# Patient Record
Sex: Female | Born: 1950
Health system: Southern US, Community
[De-identification: ages and names within clinical notes are randomized; demographics above are authoritative.]

## PROBLEM LIST (undated history)

## (undated) DIAGNOSIS — T7840XA Allergy, unspecified, initial encounter: Secondary | ICD-10-CM

## (undated) DIAGNOSIS — I10 Essential (primary) hypertension: Secondary | ICD-10-CM

## (undated) DIAGNOSIS — K219 Gastro-esophageal reflux disease without esophagitis: Secondary | ICD-10-CM

## (undated) DIAGNOSIS — G4730 Sleep apnea, unspecified: Secondary | ICD-10-CM

## (undated) DIAGNOSIS — M1990 Unspecified osteoarthritis, unspecified site: Secondary | ICD-10-CM

## (undated) DIAGNOSIS — K5900 Constipation, unspecified: Secondary | ICD-10-CM

## (undated) DIAGNOSIS — E785 Hyperlipidemia, unspecified: Secondary | ICD-10-CM

## (undated) HISTORY — PX: PROLAPSED UTERINE FIBROID LIGATION: SHX5400

## (undated) HISTORY — DX: Constipation, unspecified: K59.00

## (undated) HISTORY — DX: Allergy, unspecified, initial encounter: T78.40XA

## (undated) HISTORY — PX: ABDOMINAL HYSTERECTOMY: SHX81

## (undated) HISTORY — DX: Essential (primary) hypertension: I10

## (undated) HISTORY — PX: COLPORRHAPHY: SHX921

## (undated) HISTORY — DX: Hyperlipidemia, unspecified: E78.5

## (undated) HISTORY — PX: SALPINGOOPHORECTOMY: SHX82

## (undated) HISTORY — DX: Gastro-esophageal reflux disease without esophagitis: K21.9

## (undated) HISTORY — DX: Sleep apnea, unspecified: G47.30

## (undated) HISTORY — DX: Unspecified osteoarthritis, unspecified site: M19.90

---

## 2003-03-30 ENCOUNTER — Emergency Department (HOSPITAL_COMMUNITY): Admission: AD | Admit: 2003-03-30 | Discharge: 2003-03-30 | Payer: Self-pay | Admitting: Family Medicine

## 2003-04-13 ENCOUNTER — Emergency Department (HOSPITAL_COMMUNITY): Admission: AD | Admit: 2003-04-13 | Discharge: 2003-04-13 | Payer: Self-pay | Admitting: Family Medicine

## 2003-05-28 ENCOUNTER — Emergency Department (HOSPITAL_COMMUNITY): Admission: EM | Admit: 2003-05-28 | Discharge: 2003-05-28 | Payer: Self-pay | Admitting: Emergency Medicine

## 2004-01-10 ENCOUNTER — Ambulatory Visit: Payer: Self-pay | Admitting: Family Medicine

## 2004-01-15 ENCOUNTER — Ambulatory Visit: Payer: Self-pay | Admitting: Family Medicine

## 2004-01-16 ENCOUNTER — Ambulatory Visit: Payer: Self-pay | Admitting: *Deleted

## 2004-02-25 ENCOUNTER — Ambulatory Visit: Payer: Self-pay | Admitting: Family Medicine

## 2004-03-07 ENCOUNTER — Ambulatory Visit: Payer: Self-pay | Admitting: Internal Medicine

## 2004-03-11 ENCOUNTER — Ambulatory Visit: Payer: Self-pay | Admitting: Family Medicine

## 2004-10-06 ENCOUNTER — Ambulatory Visit: Payer: Self-pay | Admitting: Family Medicine

## 2004-10-13 ENCOUNTER — Ambulatory Visit: Payer: Self-pay | Admitting: Family Medicine

## 2004-11-10 ENCOUNTER — Ambulatory Visit: Payer: Self-pay | Admitting: Family Medicine

## 2005-02-08 ENCOUNTER — Emergency Department (HOSPITAL_COMMUNITY): Admission: EM | Admit: 2005-02-08 | Discharge: 2005-02-08 | Payer: Self-pay | Admitting: Emergency Medicine

## 2005-03-11 ENCOUNTER — Encounter (INDEPENDENT_AMBULATORY_CARE_PROVIDER_SITE_OTHER): Payer: Self-pay | Admitting: Family Medicine

## 2005-03-11 LAB — CONVERTED CEMR LAB: Pap Smear: NORMAL

## 2005-03-19 ENCOUNTER — Ambulatory Visit: Payer: Self-pay | Admitting: Family Medicine

## 2005-08-20 ENCOUNTER — Ambulatory Visit: Payer: Self-pay | Admitting: Family Medicine

## 2005-08-23 ENCOUNTER — Emergency Department (HOSPITAL_COMMUNITY): Admission: EM | Admit: 2005-08-23 | Discharge: 2005-08-23 | Payer: Self-pay | Admitting: Emergency Medicine

## 2005-08-25 ENCOUNTER — Ambulatory Visit: Payer: Self-pay | Admitting: Family Medicine

## 2005-08-31 ENCOUNTER — Ambulatory Visit: Payer: Self-pay | Admitting: Family Medicine

## 2005-09-15 ENCOUNTER — Ambulatory Visit: Payer: Self-pay | Admitting: Family Medicine

## 2005-09-18 DIAGNOSIS — R809 Proteinuria, unspecified: Secondary | ICD-10-CM | POA: Insufficient documentation

## 2005-10-26 ENCOUNTER — Ambulatory Visit: Payer: Self-pay | Admitting: Family Medicine

## 2005-12-07 ENCOUNTER — Ambulatory Visit: Payer: Self-pay | Admitting: Family Medicine

## 2005-12-08 DIAGNOSIS — M76899 Other specified enthesopathies of unspecified lower limb, excluding foot: Secondary | ICD-10-CM | POA: Insufficient documentation

## 2006-02-16 ENCOUNTER — Ambulatory Visit: Payer: Self-pay | Admitting: Family Medicine

## 2006-04-16 ENCOUNTER — Ambulatory Visit: Payer: Self-pay | Admitting: Family Medicine

## 2006-05-19 ENCOUNTER — Ambulatory Visit (HOSPITAL_COMMUNITY): Admission: RE | Admit: 2006-05-19 | Discharge: 2006-05-19 | Payer: Self-pay | Admitting: Internal Medicine

## 2006-09-20 ENCOUNTER — Telehealth (INDEPENDENT_AMBULATORY_CARE_PROVIDER_SITE_OTHER): Payer: Self-pay | Admitting: *Deleted

## 2006-09-22 ENCOUNTER — Encounter (INDEPENDENT_AMBULATORY_CARE_PROVIDER_SITE_OTHER): Payer: Self-pay | Admitting: *Deleted

## 2006-09-24 ENCOUNTER — Encounter (INDEPENDENT_AMBULATORY_CARE_PROVIDER_SITE_OTHER): Payer: Self-pay | Admitting: Family Medicine

## 2006-09-24 DIAGNOSIS — E785 Hyperlipidemia, unspecified: Secondary | ICD-10-CM | POA: Insufficient documentation

## 2006-09-24 DIAGNOSIS — D259 Leiomyoma of uterus, unspecified: Secondary | ICD-10-CM | POA: Insufficient documentation

## 2006-09-28 ENCOUNTER — Ambulatory Visit: Payer: Self-pay | Admitting: Family Medicine

## 2006-09-28 ENCOUNTER — Encounter (INDEPENDENT_AMBULATORY_CARE_PROVIDER_SITE_OTHER): Payer: Self-pay | Admitting: Family Medicine

## 2006-09-28 DIAGNOSIS — M25569 Pain in unspecified knee: Secondary | ICD-10-CM | POA: Insufficient documentation

## 2006-09-28 DIAGNOSIS — L723 Sebaceous cyst: Secondary | ICD-10-CM | POA: Insufficient documentation

## 2006-09-28 DIAGNOSIS — N814 Uterovaginal prolapse, unspecified: Secondary | ICD-10-CM | POA: Insufficient documentation

## 2006-09-28 DIAGNOSIS — I10 Essential (primary) hypertension: Secondary | ICD-10-CM | POA: Insufficient documentation

## 2006-09-28 LAB — CONVERTED CEMR LAB
Bilirubin Urine: NEGATIVE
Blood in Urine, dipstick: NEGATIVE
Glucose, Urine, Semiquant: NEGATIVE
Pap Smear: NORMAL
Specific Gravity, Urine: >=1.03
pH: 6

## 2006-10-04 ENCOUNTER — Encounter (INDEPENDENT_AMBULATORY_CARE_PROVIDER_SITE_OTHER): Payer: Self-pay | Admitting: Family Medicine

## 2006-10-05 ENCOUNTER — Encounter (INDEPENDENT_AMBULATORY_CARE_PROVIDER_SITE_OTHER): Payer: Self-pay | Admitting: Family Medicine

## 2006-10-15 ENCOUNTER — Ambulatory Visit: Payer: Self-pay | Admitting: Family Medicine

## 2006-10-15 LAB — CONVERTED CEMR LAB
ALT: 33 units/L (ref 0–35)
AST: 27 units/L (ref 0–37)
Alkaline Phosphatase: 77 units/L (ref 39–117)
Calcium: 9.2 mg/dL (ref 8.4–10.5)
Chloride: 103 meq/L (ref 96–112)
Creatinine, Ser: 0.76 mg/dL (ref 0.40–1.20)
HDL: 50 mg/dL (ref >39)
LDL Cholesterol: 109 mg/dL — ABNORMAL HIGH (ref 0–99)
Total CHOL/HDL Ratio: 3.6
VLDL: 23 mg/dL (ref 0–40)

## 2007-05-27 ENCOUNTER — Ambulatory Visit (HOSPITAL_COMMUNITY): Admission: RE | Admit: 2007-05-27 | Discharge: 2007-05-27 | Payer: Self-pay | Admitting: Family Medicine

## 2007-07-21 ENCOUNTER — Telehealth (INDEPENDENT_AMBULATORY_CARE_PROVIDER_SITE_OTHER): Payer: Self-pay | Admitting: Family Medicine

## 2007-09-13 ENCOUNTER — Ambulatory Visit: Payer: Self-pay | Admitting: Family Medicine

## 2007-09-13 LAB — CONVERTED CEMR LAB
Cholesterol: 180 mg/dL (ref 0–200)
Triglycerides: 113 mg/dL (ref <150)

## 2007-09-26 ENCOUNTER — Encounter (INDEPENDENT_AMBULATORY_CARE_PROVIDER_SITE_OTHER): Payer: Self-pay | Admitting: Family Medicine

## 2007-11-15 ENCOUNTER — Ambulatory Visit: Payer: Self-pay | Admitting: Family Medicine

## 2007-11-15 ENCOUNTER — Encounter (INDEPENDENT_AMBULATORY_CARE_PROVIDER_SITE_OTHER): Payer: Self-pay | Admitting: Family Medicine

## 2007-11-15 LAB — CONVERTED CEMR LAB
ALT: 18 units/L (ref 0–35)
AST: 19 units/L (ref 0–37)
BUN: 14 mg/dL (ref 6–23)
Basophils Absolute: 0 10*3/uL (ref 0.0–0.1)
Basophils Relative: 0 % (ref 0–1)
Calcium: 9.4 mg/dL (ref 8.4–10.5)
Creatinine, Ser: 0.76 mg/dL (ref 0.40–1.20)
Eosinophils Relative: 1 % (ref 0–5)
HCT: 41.4 % (ref 36.0–46.0)
Hemoglobin: 14.2 g/dL (ref 12.0–15.0)
MCHC: 34.3 g/dL (ref 30.0–36.0)
MCV: 90.6 fL (ref 78.0–100.0)
Monocytes Absolute: 0.7 10*3/uL (ref 0.1–1.0)
Monocytes Relative: 8 % (ref 3–12)
RBC: 4.57 M/uL (ref 3.87–5.11)
RDW: 14.3 % (ref 11.5–15.5)
Total Bilirubin: 0.5 mg/dL (ref 0.3–1.2)

## 2007-11-22 ENCOUNTER — Encounter (INDEPENDENT_AMBULATORY_CARE_PROVIDER_SITE_OTHER): Payer: Self-pay | Admitting: Family Medicine

## 2008-01-25 ENCOUNTER — Ambulatory Visit: Payer: Self-pay | Admitting: Obstetrics and Gynecology

## 2008-01-26 ENCOUNTER — Ambulatory Visit (HOSPITAL_COMMUNITY): Admission: RE | Admit: 2008-01-26 | Discharge: 2008-01-26 | Payer: Self-pay | Admitting: Family Medicine

## 2008-02-01 ENCOUNTER — Ambulatory Visit: Payer: Self-pay | Admitting: Obstetrics & Gynecology

## 2008-02-20 ENCOUNTER — Ambulatory Visit: Payer: Self-pay | Admitting: Obstetrics & Gynecology

## 2008-02-20 ENCOUNTER — Encounter: Payer: Self-pay | Admitting: Obstetrics & Gynecology

## 2008-02-20 ENCOUNTER — Inpatient Hospital Stay (HOSPITAL_COMMUNITY): Admission: RE | Admit: 2008-02-20 | Discharge: 2008-02-21 | Payer: Self-pay | Admitting: Obstetrics & Gynecology

## 2008-02-23 ENCOUNTER — Ambulatory Visit: Payer: Self-pay | Admitting: Obstetrics & Gynecology

## 2008-03-29 ENCOUNTER — Ambulatory Visit: Payer: Self-pay | Admitting: Obstetrics and Gynecology

## 2010-04-22 LAB — CROSSMATCH: ABO/RH(D): O POS

## 2010-04-22 LAB — CBC
HCT: 41.9 % (ref 36.0–46.0)
HCT: 34.6 % — ABNORMAL LOW (ref 36.0–46.0)
Hemoglobin: 11.4 g/dL — ABNORMAL LOW (ref 12.0–15.0)
MCHC: 32.4 g/dL (ref 30.0–36.0)
MCHC: 33.1 g/dL (ref 30.0–36.0)
MCV: 92.7 fL (ref 78.0–100.0)
MCV: 92.9 fL (ref 78.0–100.0)
RBC: 4.52 MIL/uL (ref 3.87–5.11)
RBC: 3.72 MIL/uL — ABNORMAL LOW (ref 3.87–5.11)
WBC: 9.9 10*3/uL (ref 4.0–10.5)
WBC: 12.8 10*3/uL — ABNORMAL HIGH (ref 4.0–10.5)

## 2010-04-22 LAB — BASIC METABOLIC PANEL
BUN: 15 mg/dL (ref 6–23)
CO2: 30 mEq/L (ref 19–32)
Chloride: 103 mEq/L (ref 96–112)
Creatinine, Ser: 0.56 mg/dL (ref 0.4–1.2)
GFR calc Af Amer: >60 mL/min (ref >60)
Potassium: 3.9 mEq/L (ref 3.5–5.1)

## 2010-05-20 NOTE — Group Therapy Note (Signed)
NAME:  Heather Gibson, Heather Gibson NO.:  [id number]   MEDICAL RECORD NO.:  [id number]          PATIENT TYPE:  WOC   LOCATION:  [facility]                   FACILITY:  [facility]   PHYSICIAN:  Scheryl Darter, MD       DATE OF BIRTH:  1950/05/29   DATE OF SERVICE:                                  CLINIC NOTE   The patient comes today in followup after a transvaginal hysterectomy,  anterior colporrhaphy, and right salpingo-oophorectomy that was  performed by Dr. Marice Potter on February 20, 2008.  The patient has no  complaints.  She is currently using Estrace vaginal cream, but would  like to be on estrogen replacement by mouth.  No complaints of abnormal  discharge or urinary symptoms.  No pain or fever.   PHYSICAL EXAMINATION:  GENERAL:  The patient is in no acute distress.  ABDOMEN:  Soft, nontender.  EXTERNAL GENITALIA:  Vagina and vaginal cuff are well supported with  remnant of suture visible and well-healed suture line.  Vaginal cuff is  nontender.  No mass.   IMPRESSION:  She is doing well postoperatively.   PLAN:  The patient will have routine followup.  I gave her prescription  for Estrace 0.5 mg p.o. daily.  She should had yearly pelvic exams,  breast exams, and mammograms.  Also, discussed calcium supplementation  and vitamin D.      Scheryl Darter, MD     JA/MEDQ  D:  03/29/2008  T:  03/30/2008  Job:  [id number]

## 2010-05-20 NOTE — Group Therapy Note (Signed)
NAMEMIRIAM, Heather Gibson NO.:  [id number]   MEDICAL RECORD NO.:  [id number]          PATIENT TYPE:  WOC   LOCATION:  [facility]                   FACILITY:  [facility]   PHYSICIAN:  Johnella Moloney, MD        DATE OF BIRTH:  01-25-1950   DATE OF SERVICE:  02/01/2008                                  CLINIC NOTE   The patient is a 60 year old gravida 4, para 3-0-1-3 who was last seen  on January 25, 2008, for third degree uterine prolapse and a  cystorectocele.  At that point, the patient was counseled regarding  surgery and a surgical order was sent in for scheduling.  However, as a  means to alleviate her symptoms prior to surgery, Gelhorn pessary was  placed.  The patient reports that as soon as she went home the pessary  did fall out.  She said that it was very uncomfortable while it was in  place, and she is not interested in having this replaced at this visit.  The patient was also given a prescription for estrogen cream which she  feels is very expensive and wants to know if it was really necessary to  have this.  She has no other complaints.   The patient was told that given the nature of her cystorectocele and the  uterine prolapse that she will need repair of the cystocele or rectocele  as well as hysterectomy.  In order to prepare the vagina for surgery, a  course of estradiol cream is usually recommended in order to help  strengthen the tissues of the vagina and make them optimal for surgery.  She does understand this and will go ahead and fill this prescription  for the estradiol cream.  Of note, the patient did have a pelvic  ultrasound on January 26 1008, which showed that her uterus was about 7  weeks' size but had a subserosal fibroid emanating off the fundus of the  uterus which measured 6 x 5.8 x 5.5 cm.  No other abnormalities were  seen.  This result was discussed with the patient.  She was told that a  surgical scheduler should be contacting her soon with  information  regarding the date and time of her surgery.           ______________________________  Johnella Moloney, MD     UD/MEDQ  D:  02/01/2008  T:  02/01/2008  Job:  [id number]

## 2010-05-20 NOTE — Group Therapy Note (Signed)
NAME:  Heather Gibson, BAUS NO.:  [id number]   MEDICAL RECORD NO.:  [id number]          PATIENT TYPE:  WOC   LOCATION[facility]                   [facility]ACILITY[facility]   PHYSICIAN:  Argentina Donovan, MD        DATE OF BIRTH:  December 13, 1950   DATE OF SERVICE:  01/25/2008                                  CLINIC NOTE   The patient is a 60 year old African-American female, gravida 4, para 3-  0-1-3, with her youngest being 59 years old, who has been followed by  [facility] and was referred to the GYN clinic because of a third degree  uterine prolapse.  The patient has had the condition for about the last  3 years.  She is married but not sexually active at the present time  because of this condition.  She has no sign of stress incontinence, but  she does feel like she does not empty her bladder and finds that she has  very significant frequency of urination.   ALLERGIES:  ALLERGIES TO SEAFOOD, BUT NO MEDICATIONS.   MEDICATIONS:  1. Vytorin 10/80.  2. Benazepril with hydrochlorothiazide 20/15/5 daily for hypertension      or hypercholesterolemia.   PAST HISTORY:  1. Tubal ligation.  2. Has been treated for hypertension.  3. Has been treated for hypercholesterolemia.  (Other than that, non remarkable.)   FAMILY HISTORY:  Grandmother had diabetes.  Her parents and grandparents  have hypertension.  Her mother has recently been diagnosed with breast  cancer but refuses treatment.   REVIEW OF SYSTEMS:  Fairly negative except for the present illness.   SOCIAL HISTORY:  The patient is currently married.  Never smoked or used  alcohol or illicit drugs.   PHYSICAL EXAMINATION:  VITAL SIGNS:  Blood pressure is 186/97 (i.e. this  is her first visit; will recheck that with her next visit in 1 week).  The patient's weight is 235 and she is 5 feet, 7 inches tall.  Pulses is  83 per minute and regular.  GENERAL:  A well-developed, obese, black female in no acute distress.  HEENT:  Head  normocephalic.  Pupils equal, round and reactive to light  and accommodation; within normal limits.  NECK:  Supple.  Thyroid symmetrical with no masses.  Neck is erect.  BREASTS:  Symmetrical with no dominant masses, no nipple discharge, no  supraclavicular or axillary nodes noted.  LUNGS:  Clear to auscultation and percussion.  HEART:  Normal sinus rhythm.  No murmur, no gallop, no click or rub.  ABDOMEN:  Soft, flat, nontender.  No masses, no organomegaly noted.  EXTREMITIES:  No edema.  No varicosities.  DTRs within normal limits.  GENITALIA:  External genitalia is normal.  BUS is within normal limits.  On Valsalva, the cervix exits the vagina.  It can be easily put back in.  The vagina is clean and well rugated.  Cervix is clean and parous and  well epithelialized.  Bimanual pelvic examination - the uterus feels  slightly enlarged but could not be well outlined because of the  patient's habitus, nor could the adnexa be evaluated.  IMPRESSION:  Third-degree uterine prolapse with cystorectocele.   PLAN:  The plan is to get an ultrasound to evaluate the uterus.  Pap  smear was recently taken and normal.  The patient has had a normal  mammogram just recently.  Temporarily, I have placed a 7 cm Gellhorn  pessary to hopefully hold up the cervix so that we can prepare the  vagina for surgery.  I have given the patient estradiol cream to use  nightly until seen in a week.  We are going to reevaluate the pessary at  that time.  I am going to put the papers in for surgery.  I am not sure  who will be doing the surgery.  I will have to ask around and find out  which of the doctors feel more comfortable with this procedure.           ______________________________  Argentina Donovan, MD     PR/MEDQ  D:  01/25/2008  T:  01/25/2008  Job:  [id number]

## 2010-05-20 NOTE — Discharge Summary (Signed)
Heather Gibson, Heather Gibson             ACCOUNT NO.:  [id number]   MEDICAL RECORD NO.:  [id number]          PATIENT TYPE:  INP   LOCATION:  9309                          [facility]   PHYSICIAN:  Allie Bossier, MD        DATE OF BIRTH:  03/16/1950   DATE OF ADMISSION:  02/20/2008  DATE OF DISCHARGE:  02/21/2008                               DISCHARGE SUMMARY   ADMISSION DIAGNOSIS:  Complete procidentia.   DISCHARGE DIAGNOSIS:  Complete procidentia.   PROCEDURE:  During stay, total vaginal hysterectomy, anterior  colporrhaphy, right salpingo-oophorectomy.   CONDITION:  Stable.   DISPOSITION:  Home.   DIET:  As tolerated.   FOLLOWUP:  Followup in 2 days for a voiding trial at the GYN Clinic.   MEDICATIONS:  Percocet 5/325 mg one p.o. every 4 hours pain, #30, no  refills, estrogen cream 1.5 g per vagina every other day.   HISTORY OF PRESENT ILLNESS AND HOSPITAL COURSE:  Heather Gibson is a 61-  year-old married black gravida 4, para 4, three living children who was  seen at the GYN Clinic as a referral from the [facility] for  what was written as third-degree prolapse which started about 18 months  ago, it has become much worse.  Of note, she did not use her estrogen  cream per vagina as directed and I have explained that could effect the  healing process of the surgery.  It was strongly recommended that she  use that on discharge.  She was admitted on the 15th and underwent an  uncomplicated vaginal hysterectomy, anterior repair, and a right  salpingo-oophorectomy (I was not able to remove the very tiny atrophic  left ovary __________).  By postoperative day #1, her hemoglobin was  11.4, preoperatively her hemoglobin was 13.6.  On postop day #1, she was  eating and denying nausea or vomiting.  She was ambulating and ready to  go home.  We did do a voiding trial; however, she had postvoid residual,  was greater than 100 mL and I told her she will need to have her  catheter  left in and come back.  She was discharged home in stable  condition with a normal physical exam.      Allie Bossier, MD  Electronically Signed     MCD/MEDQ  D:  03/18/2008  T:  03/19/2008  Job:  [id number]

## 2010-05-20 NOTE — Op Note (Signed)
Heather Gibson, Heather Gibson             ACCOUNT NO.:  [id number]   MEDICAL RECORD NO.:  [id number]          PATIENT TYPE:  INP   LOCATION:  9309                          [facility]   PHYSICIAN:  Allie Bossier, MD        DATE OF BIRTH:  1950/04/20   DATE OF PROCEDURE:  02/20/2008  DATE OF DISCHARGE:                               OPERATIVE REPORT   PREOPERATIVE DIAGNOSES:  1. Complete procidentia.  2. Cystocele.  3. Fibroid uterus.  4. Obesity.   POSTOPERATIVE DIAGNOSES:  1. Complete procidentia.  2. Cystocele.  3. Fibroid uterus.  4. Obesity.   PROCEDURES:  1. Total vaginal hysterectomy.  2. Right salpingo-oophorectomy.  3. Anterior colporrhaphy.   SURGEON:  Allie Bossier, MD   ASSISTANTMichele Mcalpine D. Okey Dupre, MD   ANESTHESIA:  Belva Agee, MD, GETA.   COMPLICATIONS:  None.   ESTIMATED BLOOD LOSS:  Less than 200 mL.   SPECIMEN:  Uterus with cervix and right tube and ovary.   DETAILS OF PROCEDURE AND FINDINGS:  Preoperatively, all questions were  answered.  I explained to Ms. Locastro that I would remove her ovaries  and tubes if I was able to, but at times in a postmenopausal woman, they  are difficult to remove and that I would leave them if I felt that  removing her ovaries would put her in danger of a complication.  In the  operating room, general anesthesia was applied without complication.  She was placed in dorsal lithotomy position.  Her lower abdomen and  vagina were prepped and draped in the usual sterile fashion using  chlorhexidine.  A Foley catheter was placed in it and drained clear  urine throughout the case.  A bimanual exam revealed a normal size and  shape of uterus.  A very long cervix was protruding from the vaginal  opening by at least 2 or 3 cm.  A weighted speculum was placed  posteriorly and Deaver anteriorly.  Solution was created using 0.5%  Marcaine (30 mL and 100 mL of injectable saline).  This solution was  used throughout this case unless  otherwise specified.  Initially, I  injected approximately 70 mL in a circumferential fashion at the  cervicovaginal junction and then made a circumferential incision, there  was a #10 blade.  Posterior peritoneum was entered, tagged, and held.  A  2-0 Vicryl sutures used throughout this case unless otherwise specified.  The anterior peritoneum was entered.  The uterosacral ligaments were  clamped, cut, and doubly ligated.  These were tagged and held for  further use.  The very long cervix was separated from its pelvic  attachments using the clamp, cut, and ligate technique.  The utero-  ovarian ligaments were clamped, cut, and doubly ligated.  These were  then tagged and held for further use.  The right ovary appeared to be  slightly larger than the normal postmenopausal ovary.  It was grasped  with a Babcock clamp and the infundibular pelvic ligament was clamped,  cut, and doubly ligated.  Excellent hemostasis was noted.  On the  patient's left side, the ovary was much smaller, and it was fairly  adherent to the pelvic sidewall.  While I was able to grasp it with a  Babcock, there was no descensus of the pedicle or of the adnexa and I  was not able to grasp the pedicle with a clamp.  Therefore I left the  left adnexa in situ and then I used the tagged uterosacral ligaments to  make angle sutures.  I used a mattress suture at each side.  Then,  proceeded with the anterior repair and used the injectable solution  again, approximately 30 mL were injected in a vertical fashion in the  vaginal mucosa.  Using Metzenbaum scissors, I made a vertical cut here,  I then used T-clamps to help dissect the overlying vaginal mucosa off  the underlying vesicovaginal fascia.  I then plicated using interrupted  2-0 Vicryl sutures.  The excess vaginal mucosa was excised and the  vaginal mucosa was reapproximated using a 2-0 Vicryl running locking  suture.  Excellent hemostasis was noted.  After changing  gloves, I did  rectal exam, there did not appear to be a significant rectocele and good  cosmetic results were obtained.  She was extubated and taken to recovery  room in stable condition.  Her Foley catheter drained clear urine  throughout.  She tolerated the procedure well.       Allie Bossier, MD  Electronically Signed     MCD/MEDQ  D:  02/20/2008  T:  02/21/2008  Job:  [id number]

## 2012-02-10 ENCOUNTER — Other Ambulatory Visit (HOSPITAL_COMMUNITY): Payer: Self-pay | Admitting: Family Medicine

## 2012-02-10 DIAGNOSIS — Z1231 Encounter for screening mammogram for malignant neoplasm of breast: Secondary | ICD-10-CM

## 2012-02-18 ENCOUNTER — Ambulatory Visit (HOSPITAL_COMMUNITY): Payer: Self-pay

## 2012-02-24 ENCOUNTER — Ambulatory Visit (HOSPITAL_COMMUNITY)
Admission: RE | Admit: 2012-02-24 | Discharge: 2012-02-24 | Disposition: A | Discharge status: Home / Self Care | Payer: Self-pay | Source: Ambulatory Visit | Attending: Family Medicine | Admitting: Family Medicine

## 2012-02-24 DIAGNOSIS — Z1231 Encounter for screening mammogram for malignant neoplasm of breast: Secondary | ICD-10-CM

## 2012-10-10 ENCOUNTER — Ambulatory Visit (INDEPENDENT_AMBULATORY_CARE_PROVIDER_SITE_OTHER): Payer: Self-pay | Admitting: Obstetrics & Gynecology

## 2012-10-10 ENCOUNTER — Encounter: Payer: Self-pay | Admitting: Obstetrics & Gynecology

## 2012-10-10 VITALS — BP 171/84 | HR 58 | Ht 66.5 in | Wt 194.9 lb

## 2012-10-10 DIAGNOSIS — N993 Prolapse of vaginal vault after hysterectomy: Secondary | ICD-10-CM

## 2012-10-10 NOTE — Progress Notes (Signed)
  Subjective:    Patient ID: Heather Gibson, female    DOB: 03/25/1950, 62 y.o.   MRN: [id number]  HPI  This 62 yo abstinent lady is here because of vaginal vault prolapse. She had a TVH/RSO/anterior repair in 2010 for complete procedentia. She denies urinary incontinence.   Review of Systems     Objective:   Physical Exam  Mild vaginal atrophy Complete prolapse of vaginal vault with Valsalva  I treated her with a #7 (3 1/2 inch) ring pessary. It relieved her symptoms and she was able to remove and replaceit easily. It caused no pain.     Assessment & Plan:  Vaginal vault prolapse- Pessary RTC 4 weeks

## 2012-10-14 ENCOUNTER — Telehealth: Payer: Self-pay

## 2012-10-14 NOTE — Telephone Encounter (Signed)
Pt called and stated that she wanted to change her appt.

## 2012-10-27 NOTE — Telephone Encounter (Signed)
Will transfer to front desk to change appointment and for them to call pt. With new appt.

## 2012-11-16 ENCOUNTER — Encounter: Payer: Self-pay | Admitting: Obstetrics & Gynecology

## 2012-11-16 ENCOUNTER — Ambulatory Visit (INDEPENDENT_AMBULATORY_CARE_PROVIDER_SITE_OTHER): Payer: Self-pay | Admitting: Obstetrics & Gynecology

## 2012-11-16 VITALS — BP 147/80 | HR 62 | Temp 98.6°F | Ht 66.5 in | Wt 194.1 lb

## 2012-11-16 DIAGNOSIS — N993 Prolapse of vaginal vault after hysterectomy: Secondary | ICD-10-CM

## 2012-11-16 NOTE — Progress Notes (Signed)
Pt. States pessary did not work; fell out 3-4 days after having it placed.

## 2012-11-16 NOTE — Progress Notes (Signed)
  Subjective:    Patient ID: Heather Gibson, female    DOB: 1950/11/07, 62 y.o.   MRN: [id number]  HPI  Ms. Kisling is here for a pessary check. She was using a #7 ring but it eventually fell out.   Review of Systems     Objective:   Physical Exam  I placed a #5 Regula. She said that it relieved her prolapse and she could not feel it. She was able to remove and replace it.       Assessment & Plan:   Vaginal vault prolapse- RTC 1 month for pessary check

## 2012-12-14 ENCOUNTER — Ambulatory Visit: Payer: Self-pay | Admitting: Obstetrics & Gynecology

## 2013-02-06 ENCOUNTER — Encounter: Payer: Self-pay | Admitting: Obstetrics & Gynecology

## 2013-02-06 ENCOUNTER — Ambulatory Visit (INDEPENDENT_AMBULATORY_CARE_PROVIDER_SITE_OTHER): Payer: BC Managed Care – PPO | Admitting: Obstetrics & Gynecology

## 2013-02-06 VITALS — BP 166/84 | HR 73 | Temp 98.0°F | Ht 65.0 in | Wt 196.3 lb

## 2013-02-06 DIAGNOSIS — N993 Prolapse of vaginal vault after hysterectomy: Secondary | ICD-10-CM

## 2013-02-06 NOTE — Progress Notes (Signed)
   Subjective:    Patient ID: Heather Gibson, female    DOB: 1950/05/03, 63 y.o.   MRN: [id number]  HPI This lovely lady s/p hysterectomy is here today for a pessary check. She was fitted for a #5 Regula pessary which seemed to be the right fit in the office 10/14. However, she tells me that it fell out after a few days. I tried a #6 here today and it fell out as well. I tried a #7 cube, but it was too large and wouldn't fit in.   Review of Systems She has already had her flu vaccine this season.    Objective:   Physical Exam        Assessment & Plan:  Prolapse of vagina after hysterectomy. I will order a #5 and #6 cube. I will also refer her to Dr. Maryland Pink.

## 2013-02-06 NOTE — Progress Notes (Signed)
Referral made to [place] with Dr. Maryland Pink. Appointment 03/13/13 at 2:45pm at [facility] office. Pt informed that they will be sending her forms to fill out and return. Pt. Verbalized understanding.

## 2013-03-06 ENCOUNTER — Ambulatory Visit (INDEPENDENT_AMBULATORY_CARE_PROVIDER_SITE_OTHER): Payer: BC Managed Care – PPO | Admitting: Obstetrics & Gynecology

## 2013-03-06 ENCOUNTER — Encounter: Payer: Self-pay | Admitting: Obstetrics & Gynecology

## 2013-03-06 VITALS — BP 180/80 | HR 64 | Temp 97.6°F | Ht 66.0 in | Wt 200.1 lb

## 2013-03-06 DIAGNOSIS — N993 Prolapse of vaginal vault after hysterectomy: Secondary | ICD-10-CM

## 2013-03-06 NOTE — Progress Notes (Signed)
   Subjective:    Patient ID: Heather Gibson, female    DOB: [birth year], 63 y.o.   MRN: [id number]  HPI  She is here for her pessary fitting. She has an appt with Dr. Maryland Pink next week.   Review of Systems     Objective:   Physical Exam  #5 cube worked well. She was able to remove and replace it.      Assessment & Plan:  Prolapse of vagina- pessary for now. She will RTC here in a month if she decides not to have surgery with Dr. Zigmund Daniel. She will remove it QOD

## 2013-03-06 NOTE — Progress Notes (Signed)
Pt stated that she has taken BP medication today, reason for elevated BP

## 2013-04-12 ENCOUNTER — Ambulatory Visit: Payer: BC Managed Care – PPO | Admitting: Obstetrics & Gynecology

## 2013-11-06 ENCOUNTER — Encounter: Payer: Self-pay | Admitting: Obstetrics & Gynecology

## 2015-01-06 HISTORY — PX: COLONOSCOPY: SHX174

## 2015-01-21 ENCOUNTER — Ambulatory Visit
Admission: RE | Admit: 2015-01-21 | Discharge: 2015-01-21 | Disposition: A | Discharge status: Home / Self Care | Payer: BLUE CROSS/BLUE SHIELD | Source: Ambulatory Visit

## 2015-01-21 ENCOUNTER — Other Ambulatory Visit: Payer: Self-pay

## 2015-01-21 DIAGNOSIS — Z1231 Encounter for screening mammogram for malignant neoplasm of breast: Secondary | ICD-10-CM

## 2015-04-05 ENCOUNTER — Ambulatory Visit (AMBULATORY_SURGERY_CENTER): Payer: Self-pay | Admitting: *Deleted

## 2015-04-05 VITALS — Ht 67.0 in | Wt 224.0 lb

## 2015-04-05 DIAGNOSIS — Z1211 Encounter for screening for malignant neoplasm of colon: Secondary | ICD-10-CM

## 2015-04-05 NOTE — Progress Notes (Signed)
Pt has [facility] and her procedure is on 05-03-15.  Staff message sent to Julieanne Cotton about a sample.  I put on her instructions to call her if she has not heard from [place] by 04-19-15

## 2015-04-15 ENCOUNTER — Ambulatory Visit (INDEPENDENT_AMBULATORY_CARE_PROVIDER_SITE_OTHER): Payer: BLUE CROSS/BLUE SHIELD | Admitting: Adult Health

## 2015-04-15 ENCOUNTER — Encounter: Payer: Self-pay | Admitting: Adult Health

## 2015-04-15 VITALS — BP 122/68 | Temp 98.8°F | Wt 224.2 lb

## 2015-04-15 DIAGNOSIS — Z7189 Other specified counseling: Secondary | ICD-10-CM | POA: Diagnosis not present

## 2015-04-15 DIAGNOSIS — I10 Essential (primary) hypertension: Secondary | ICD-10-CM | POA: Diagnosis not present

## 2015-04-15 DIAGNOSIS — Z780 Asymptomatic menopausal state: Secondary | ICD-10-CM | POA: Diagnosis not present

## 2015-04-15 DIAGNOSIS — E785 Hyperlipidemia, unspecified: Secondary | ICD-10-CM

## 2015-04-15 DIAGNOSIS — Z7689 Persons encountering health services in other specified circumstances: Secondary | ICD-10-CM

## 2015-04-15 NOTE — Patient Instructions (Signed)
It was great meeting you today!  Please call the breast center to schedule your bone density screen  Work on diet and exercise - follow up with me in 3 months, at that time your goal weight is 200 pounds.   Have your old PCP send me their notes on you.   If you need anything in the meantime, please let me know.

## 2015-04-15 NOTE — Progress Notes (Signed)
Patient presents to clinic today to establish care. She is a pleasant AA female. She  has a past medical history of Hypertension; Hyperlipidemia; GERD (gastroesophageal reflux disease); and Constipation.  Acute Concerns: Establish Care  Chronic Issues: Hypertension - Recently started on Norvasc, Has been on Zestoretic for many years.   Hyperlipidemia She recently had a physical at her old primary care provider. She was told her cholesterol was high and was started on Crestor.   Health Maintenance: Dental -- Has not seen  Vision -- Eye doctor  Immunizations -- UTD Colonoscopy -- Has an appointment on April 28 Mammogram -- January 2017 - clean PAP -- No longer needs Bone Density -- Bone Density Scan  Exercise: Does not exercise  Diet: Eats healthy.   Past Medical History  Diagnosis Date  . Hypertension   . Hyperlipidemia     Past Surgical History  Procedure Laterality Date  . Abdominal hysterectomy    . Salpingoophorectomy Right   . Colporrhaphy    . Prolapsed uterine fibroid ligation      x2    Current Outpatient Prescriptions on File Prior to Visit  Medication Sig Dispense Refill  . acetaminophen (TYLENOL) 325 MG tablet Take 650 mg by mouth every 6 (six) hours as needed.    Marland Kitchen amLODipine (NORVASC) 2.5 MG tablet Take 10 mg by mouth.    . Calcium Carbonate-Vit D-Min (CALCIUM 1200 PO) Take by mouth.    Marland Kitchen lisinopril-hydrochlorothiazide (PRINZIDE,ZESTORETIC) 20-25 MG per tablet Take 1 tablet by mouth daily.     No current facility-administered medications on file prior to visit.    Allergies  Allergen Reactions  . Eggs Or Egg-Derived Products     Years ago dx- eats egg now without any problems  . Latex Swelling  . Milk-Related Compounds     Cannot have large amt of milk products  . Peanut-Containing Drug Products     Cannot have large amts of    Family History  Problem Relation Age of Onset  . Colon cancer Neg Hx   . Esophageal cancer Neg Hx   . Rectal  cancer Neg Hx   . Stomach cancer Neg Hx     Social History   Social History  . Marital Status: Married    Spouse Name: N/A  . Number of Children: N/A  . Years of Education: N/A   Occupational History  . Not on file.   Social History Main Topics  . Smoking status: Never Smoker   . Smokeless tobacco: Never Used  . Alcohol Use: No  . Drug Use: No  . Sexual Activity: Not Currently    Birth Control/ Protection: Post-menopausal   Other Topics Concern  . Not on file   Social History Narrative    Review of Systems  Constitutional: Negative.   HENT: Negative.   Eyes: Negative.   Respiratory: Negative.   Cardiovascular: Negative.   Gastrointestinal: Negative.   Genitourinary: Negative.   Musculoskeletal: Negative.   Skin: Negative.   Neurological: Negative.   Endo/Heme/Allergies: Negative.   Psychiatric/Behavioral: Negative.   All other systems reviewed and are negative.   BP 122/68 mmHg  Temp(Src) 98.8 F (37.1 C) (Oral)  Wt 224 lb 3.2 oz (101.696 kg)  Physical Exam  Constitutional: She is well-developed, well-nourished, and in no distress. No distress.  HENT:  Head: Normocephalic and atraumatic.  Right Ear: External ear normal.  Left Ear: External ear normal.  Nose: Nose normal.  Mouth/Throat: No oropharyngeal exudate.  Cardiovascular: Normal  rate, regular rhythm, normal heart sounds and intact distal pulses.  Exam reveals no gallop and no friction rub.   No murmur heard. Pulmonary/Chest: Effort normal and breath sounds normal. No respiratory distress. She has no wheezes. She has no rales. She exhibits no tenderness.  Skin: Skin is warm and dry. No rash noted. She is not diaphoretic. No erythema. No pallor.  Psychiatric: Mood, memory, affect and judgment normal.  Nursing note and vitals reviewed.   Assessment/Plan:  1. Encounter to establish care - Follow up in one year for CPE - Follow up in 3 months to check on weight loss.  - Follow up sooner if  needed - Work on diet and needs to incorporate exercise 2. Post-menopausal  - DG Bone Density; Future  3. Essential hypertension - Controlled with current medication - Hopefully we can get her off some of her medication with weight loss.   4. Hyperlipidemia - Continue with Crestor  Dorothyann Peng, NP

## 2015-04-22 ENCOUNTER — Telehealth: Payer: Self-pay | Admitting: Internal Medicine

## 2015-04-22 NOTE — Telephone Encounter (Signed)
Left message on voicemail advising that patient is on wait list for prep and we will call her when one becomes available before her procedure.

## 2015-04-30 ENCOUNTER — Telehealth: Payer: Self-pay

## 2015-04-30 NOTE — Telephone Encounter (Signed)
Spoke with patient and told her I would a suprep sample up front for her to pick up.  Patient agreed

## 2015-05-02 ENCOUNTER — Other Ambulatory Visit: Payer: BLUE CROSS/BLUE SHIELD

## 2015-05-03 ENCOUNTER — Ambulatory Visit (AMBULATORY_SURGERY_CENTER): Payer: BLUE CROSS/BLUE SHIELD | Admitting: Internal Medicine

## 2015-05-03 ENCOUNTER — Encounter: Payer: Self-pay | Admitting: Internal Medicine

## 2015-05-03 VITALS — BP 135/62 | HR 68 | Temp 98.4°F | Resp 18 | Ht 67.0 in | Wt 224.0 lb

## 2015-05-03 DIAGNOSIS — D123 Benign neoplasm of transverse colon: Secondary | ICD-10-CM | POA: Diagnosis not present

## 2015-05-03 DIAGNOSIS — D124 Benign neoplasm of descending colon: Secondary | ICD-10-CM

## 2015-05-03 DIAGNOSIS — D125 Benign neoplasm of sigmoid colon: Secondary | ICD-10-CM | POA: Diagnosis not present

## 2015-05-03 DIAGNOSIS — Z1211 Encounter for screening for malignant neoplasm of colon: Secondary | ICD-10-CM

## 2015-05-03 MED ORDER — SODIUM CHLORIDE 0.9 % IV SOLN: 500.0000 mL | INTRAVENOUS | Status: DC

## 2015-05-03 NOTE — Progress Notes (Signed)
Report to PACU, RN, vss, BBS= Clear.  

## 2015-05-03 NOTE — Progress Notes (Signed)
Patiet states years ago when doing some allergy testing she was told she was allergic to eggs.  She states she eats eggs all the time without any problems.

## 2015-05-03 NOTE — Progress Notes (Signed)
No problems noted in the recovery room. maw 

## 2015-05-03 NOTE — Patient Instructions (Signed)
YOU HAD AN ENDOSCOPIC PROCEDURE TODAY AT THE [facility]:   Refer to the procedure report that was given to you for any specific questions about what was found during the examination.  If the procedure report does not answer your questions, please call your gastroenterologist to clarify.  If you requested that your care partner not be given the details of your procedure findings, then the procedure report has been included in a sealed envelope for you to review at your convenience later.  YOU SHOULD EXPECT: Some feelings of bloating in the abdomen. Passage of more gas than usual.  Walking can help get rid of the air that was put into your GI tract during the procedure and reduce the bloating. If you had a lower endoscopy (such as a colonoscopy or flexible sigmoidoscopy) you may notice spotting of blood in your stool or on the toilet paper. If you underwent a bowel prep for your procedure, you may not have a normal bowel movement for a few days.  Please Note:  You might notice some irritation and congestion in your nose or some drainage.  This is from the oxygen used during your procedure.  There is no need for concern and it should clear up in a day or so.  SYMPTOMS TO REPORT IMMEDIATELY:   Following lower endoscopy (colonoscopy or flexible sigmoidoscopy):  Excessive amounts of blood in the stool  Significant tenderness or worsening of abdominal pains  Swelling of the abdomen that is new, acute  Fever of 100F or higher   For urgent or emergent issues, a gastroenterologist can be reached at any hour by calling [phone].   DIET: Your first meal following the procedure should be a small meal and then it is ok to progress to your normal diet. Heavy or fried foods are harder to digest and may make you feel nauseous or bloated.  Likewise, meals heavy in dairy and vegetables can increase bloating.  Drink plenty of fluids but you should avoid alcoholic beverages for 24  hours.  ACTIVITY:  You should plan to take it easy for the rest of today and you should NOT DRIVE or use heavy machinery until tomorrow (because of the sedation medicines used during the test).    FOLLOW UP: Our staff will call the number listed on your records the next business day following your procedure to check on you and address any questions or concerns that you may have regarding the information given to you following your procedure. If we do not reach you, we will leave a message.  However, if you are feeling well and you are not experiencing any problems, there is no need to return our call.  We will assume that you have returned to your regular daily activities without incident.  If any biopsies were taken you will be contacted by phone or by letter within the next 1-3 weeks.  Please call us at [phone] if you have not heard about the biopsies in 3 weeks.    SIGNATURES/CONFIDENTIALITY: You and/or your care partner have signed paperwork which will be entered into your electronic medical record.  These signatures attest to the fact that that the information above on your After Visit Summary has been reviewed and is understood.  Full responsibility of the confidentiality of this discharge information lies with you and/or your care-partner.   Handouts were given to your care partner on polyps and hemorrhoids. You may resume your current medications today. Await biopsy results. Please call   if any questions or concerns.   

## 2015-05-03 NOTE — Progress Notes (Signed)
Called to room to assist during endoscopic procedure.  Patient ID and intended procedure confirmed with present staff. Received instructions for my participation in the procedure from the performing physician.  

## 2015-05-03 NOTE — Op Note (Signed)
[facility] Patient Name: Heather Gibson Procedure Date: 05/03/2015 2:27 PM MRN: [id number] Endoscopist: Jerene Bears , MD Age: 65 Date of Birth: 1950-09-06 Gender: Female Procedure:                Colonoscopy Indications:              Screening for malignant neoplasm in the colon, This                            is the patient's first colonoscopy Medicines:                Monitored Anesthesia Care Procedure:                Pre-Anesthesia Assessment:                           - Prior to the procedure, a History and Physical                            was performed, and patient medications and                            allergies were reviewed. The patient's tolerance of                            previous anesthesia was also reviewed. The risks                            and benefits of the procedure and the sedation                            options and risks were discussed with the patient.                            All questions were answered, and informed consent                            was obtained. Prior Anticoagulants: The patient has                            taken no previous anticoagulant or antiplatelet                            agents. ASA Grade Assessment: II - A patient with                            mild systemic disease. After reviewing the risks                            and benefits, the patient was deemed in                            satisfactory condition to undergo the procedure.  After obtaining informed consent, the colonoscope                            was passed under direct vision. Throughout the                            procedure, the patient's blood pressure, pulse, and                            oxygen saturations were monitored continuously. The                            Model PCF-H190L [id number]) scope was introduced                            through the anus and advanced to the the cecum,                  identified by appendiceal orifice and ileocecal                            valve. The colonoscopy was performed without                            difficulty. The patient tolerated the procedure                            well. The quality of the bowel preparation was                            good. The ileocecal valve, appendiceal orifice, and                            rectum were photographed. Scope In: 2:33:34 PM Scope Out: 2:51:13 PM Scope Withdrawal Time: 0 hours 13 minutes 58 seconds  Total Procedure Duration: 0 hours 17 minutes 39 seconds  Findings:                 A 2 mm polyp was found in the hepatic flexure. The                            polyp was sessile. The polyp was removed with a                            cold biopsy forceps. Resection and retrieval were                            complete.                           Three sessile polyps were found in the sigmoid                            colon, descending colon and hepatic flexure. The  polyps were 4 to 5 mm in size. These polyps were                            removed with a cold snare. Resection and retrieval                            were complete.                           Internal hemorrhoids were found during                            retroflexion. The hemorrhoids were medium-sized. Complications:            No immediate complications. Estimated Blood Loss:     Estimated blood loss: none. Impression:               - One 2 mm polyp at the hepatic flexure, removed                            with a cold biopsy forceps. Resected and retrieved.                           - Three 4 to 5 mm polyps in the sigmoid colon, in                            the descending colon and at the hepatic flexure,                            removed with a cold snare. Resected and retrieved.                           - Internal hemorrhoids. Recommendation:           - Patient has a contact number  available for                            emergencies. The signs and symptoms of potential                            delayed complications were discussed with the                            patient. Return to normal activities tomorrow.                            Written discharge instructions were provided to the                            patient.                           - Resume previous diet.                           -  Continue present medications.                           - Await pathology results.                           - Repeat colonoscopy is recommended for                            surveillance. The colonoscopy date will be                            determined after pathology results from today's                            exam become available for review. Jerene Bears, MD 05/03/2015 2:54:48 PM This report has been signed electronically.

## 2015-05-06 ENCOUNTER — Telehealth: Payer: Self-pay

## 2015-05-06 NOTE — Telephone Encounter (Signed)
  Follow up Call-  Call back number 05/03/2015  Post procedure Call Back phone  # [phone]  Permission to leave phone message Yes     Patient questions:  Do you have a fever, pain , or abdominal swelling? No. Pain Score  0 *  Have you tolerated food without any problems? Yes.    Have you been able to return to your normal activities? Yes.    Do you have any questions about your discharge instructions: Diet   No. Medications  No. Follow up visit  No.  Do you have questions or concerns about your Care? No.  Actions: * If pain score is 4 or above: No action needed, pain <4.  Per the pt she did vomit sat and 2022/04/14 x 1 each day.  Pt said she did not run a fever, no abd pain either.  Pt reported shed began CRESTOR 2-3 weeks ago and has had some nausea and vomiting.  She also had a family member who died and the funeral was April 14, 2022.  Pt said she felt a combination of stress and the crestor caused the vomiting.  I asked her to callus back if she continues to vomit and we would see that Dr. Hilarie Fredrickson is made aware of her sx.  Pt agreed and said she would. maw

## 2015-05-07 ENCOUNTER — Encounter: Payer: Self-pay | Admitting: Internal Medicine

## 2015-05-20 ENCOUNTER — Ambulatory Visit
Admission: RE | Admit: 2015-05-20 | Discharge: 2015-05-20 | Disposition: A | Discharge status: Home / Self Care | Payer: BLUE CROSS/BLUE SHIELD | Source: Ambulatory Visit | Attending: Adult Health | Admitting: Adult Health

## 2015-05-20 DIAGNOSIS — Z780 Asymptomatic menopausal state: Secondary | ICD-10-CM

## 2015-05-29 ENCOUNTER — Other Ambulatory Visit: Payer: Self-pay

## 2015-05-29 ENCOUNTER — Telehealth: Payer: Self-pay | Admitting: Adult Health

## 2015-05-29 NOTE — Telephone Encounter (Signed)
Pt request refill   amLODipine (NORVASC) 2.5 MG tablet Pt states she takes 1/ 10 mg /day. Directions on rx states Sig - Route: Take 10 mg by mouth. - Oral ALSO needs refill  lisinopril-hydrochlorothiazide (PRINZIDE,ZESTORETIC) 20-25 MG per tablet  1/day  90 day  [facility]/ [address]  Pt has not had filled by [facility] yet. She was a new pt in April. Pt has follow up 07/19/15.

## 2015-05-29 NOTE — Telephone Encounter (Signed)
Amlodipine is 2.5mg  tablet, previous sig states that patient is taking 1 tab 4 times daily.  Note below says patient takes 10mg  tab once daily  - please advise.

## 2015-05-29 NOTE — Telephone Encounter (Signed)
Ok to refill, 90 days + 3 refills.

## 2015-05-29 NOTE — Telephone Encounter (Signed)
Ok to refill 

## 2015-05-30 ENCOUNTER — Other Ambulatory Visit: Payer: Self-pay | Admitting: Adult Health

## 2015-05-31 ENCOUNTER — Other Ambulatory Visit: Payer: Self-pay

## 2015-05-31 MED ORDER — LISINOPRIL-HYDROCHLOROTHIAZIDE 20-25 MG PO TABS
1.0000 | ORAL_TABLET | Freq: Every day | ORAL | Status: DC
Start: 2015-05-31 — End: 2015-11-04

## 2015-05-31 MED ORDER — AMLODIPINE BESYLATE 10 MG PO TABS: 10.0000 mg | ORAL_TABLET | Freq: Every day | ORAL | Status: DC

## 2015-05-31 MED ORDER — ROSUVASTATIN CALCIUM 20 MG PO TABS: 20.0000 mg | ORAL_TABLET | Freq: Every day | ORAL | Status: DC

## 2015-05-31 NOTE — Telephone Encounter (Signed)
Can we see how she is taking this medication?

## 2015-05-31 NOTE — Telephone Encounter (Signed)
Patient is taking a 10mg  tablet once daily. Rx sent in as directed.

## 2015-07-19 ENCOUNTER — Ambulatory Visit: Payer: BLUE CROSS/BLUE SHIELD | Admitting: Adult Health

## 2015-08-06 ENCOUNTER — Encounter: Payer: Self-pay | Admitting: Adult Health

## 2015-08-06 ENCOUNTER — Ambulatory Visit (INDEPENDENT_AMBULATORY_CARE_PROVIDER_SITE_OTHER): Payer: BLUE CROSS/BLUE SHIELD | Admitting: Adult Health

## 2015-08-06 VITALS — BP 126/80 | Temp 98.6°F | Ht 67.0 in | Wt 222.5 lb

## 2015-08-06 DIAGNOSIS — F411 Generalized anxiety disorder: Secondary | ICD-10-CM

## 2015-08-06 DIAGNOSIS — E669 Obesity, unspecified: Secondary | ICD-10-CM | POA: Diagnosis not present

## 2015-08-06 MED ORDER — CITALOPRAM HYDROBROMIDE 20 MG PO TABS: 20.0000 mg | ORAL_TABLET | Freq: Every day | ORAL | 3 refills | Status: DC

## 2015-08-06 MED ORDER — LORCASERIN HCL 10 MG PO TABS: 10.0000 mg | ORAL_TABLET | Freq: Two times a day (BID) | ORAL | 0 refills | Status: DC

## 2015-08-06 NOTE — Progress Notes (Signed)
   Subjective:    Patient ID: Heather Gibson, female    DOB: 1950-11-01, 65 y.o.   MRN: [id number]  HPI  64 year old female who presents to the office today for three month follow up regarding weight loss. She has been trying to eat healthier and is trying to exercise more but it finding it hard to do so. She is reporting that she hs taking care of multiple family members including her mother who is progressing with dementia. She has also had multiple family members die within the last few months. In addition to this she has 2 friends who were diagnosed with cancer recently. With all of this going on she is feeling anxious and feels as though she cannot keep up. She is not sleeping well at night because the anxiety is keeping her awake. She denies any depression.     Wt Readings from Last 3 Encounters:  08/06/15 222 lb 8 oz (100.9 kg)  05/03/15 224 lb (101.6 kg)  04/15/15 224 lb 3.2 oz (101.7 kg)     Review of Systems  Constitutional: Negative.   Respiratory: Negative.   Cardiovascular: Negative.   Psychiatric/Behavioral: Positive for sleep disturbance. Negative for self-injury and suicidal ideas. The patient is nervous/anxious.   All other systems reviewed and are negative.      Objective:   Physical Exam  Constitutional: She is oriented to person, place, and time. She appears well-developed and well-nourished. No distress.  Tearful during exam Obese   Cardiovascular: Normal rate, regular rhythm, normal heart sounds and intact distal pulses.  Exam reveals no gallop and no friction rub.   No murmur heard. Pulmonary/Chest: Effort normal and breath sounds normal. No respiratory distress. She has no wheezes. She has no rales. She exhibits no tenderness.  Musculoskeletal: Normal range of motion.  Neurological: She is alert and oriented to person, place, and time. She has normal reflexes.  Skin: Skin is warm and dry. No rash noted. She is not diaphoretic. No erythema. No pallor.    Psychiatric: She has a normal mood and affect. Her behavior is normal. Judgment and thought content normal.  Nursing note and vitals reviewed.     Assessment & Plan:  1. Obesity - Lorcaserin HCl (BELVIQ) 10 MG TABS; Take 10 mg by mouth 2 (two) times daily.  Dispense: 60 tablet; Refill: 0 - need to increase exercise and work on low fat diet.  - Follow up in one month  Wt Readings from Last 3 Encounters:  08/06/15 222 lb 8 oz (100.9 kg)  05/03/15 224 lb (101.6 kg)  04/15/15 224 lb 3.2 oz (101.7 kg)    2. Generalized anxiety disorder - citalopram (CELEXA) 20 MG tablet; Take 1 tablet (20 mg total) by mouth daily.  Dispense: 30 tablet; Refill: 3 - Advised to increase exercise  - Follow up in one month - If she has any thoughts of SI or self harm she is to go to the ER right away  - Consider psychiatry referral   Dorothyann Peng, NP

## 2015-08-06 NOTE — Patient Instructions (Addendum)
I am sorry you are going through all of this.   I have prescribed an anti anxiety medication called Celexa, take this daily.   I have also prescribed Belviq for weight loss.   Continue to work on diet and exercise.   Please follow up with me in one month   Calorie Counting for Weight Loss Calories are energy you get from the things you eat and drink. Your body uses this energy to keep you going throughout the day. The number of calories you eat affects your weight. When you eat more calories than your body needs, your body stores the extra calories as fat. When you eat fewer calories than your body needs, your body burns fat to get the energy it needs. Calorie counting means keeping track of how many calories you eat and drink each day. If you make sure to eat fewer calories than your body needs, you should lose weight. In order for calorie counting to work, you will need to eat the number of calories that are right for you in a day to lose a healthy amount of weight per week. A healthy amount of weight to lose per week is usually 1-2 lb (0.5-0.9 kg). A dietitian can determine how many calories you need in a day and give you suggestions on how to reach your calorie goal.  WHAT IS MY MY PLAN? My goal is to have __________ calories per day.  If I have this many calories per day, I should lose around __________ pounds per week. WHAT DO I NEED TO KNOW ABOUT CALORIE COUNTING? In order to meet your daily calorie goal, you will need to:  Find out how many calories are in each food you would like to eat. Try to do this before you eat.  Decide how much of the food you can eat.  Write down what you ate and how many calories it had. Doing this is called keeping a food log. WHERE DO I FIND CALORIE INFORMATION? The number of calories in a food can be found on a Nutrition Facts label. Note that all the information on a label is based on a specific serving of the food. If a food does not have a Nutrition  Facts label, try to look up the calories online or ask your dietitian for help. HOW DO I DECIDE HOW MUCH TO EAT? To decide how much of the food you can eat, you will need to consider both the number of calories in one serving and the size of one serving. This information can be found on the Nutrition Facts label. If a food does not have a Nutrition Facts label, look up the information online or ask your dietitian for help. Remember that calories are listed per serving. If you choose to have more than one serving of a food, you will have to multiply the calories per serving by the amount of servings you plan to eat. For example, the label on a package of bread might say that a serving size is 1 slice and that there are 90 calories in a serving. If you eat 1 slice, you will have eaten 90 calories. If you eat 2 slices, you will have eaten 180 calories. HOW DO I KEEP A FOOD LOG? After each meal, record the following information in your food log:  What you ate.  How much of it you ate.  How many calories it had.  Then, add up your calories. Keep your food log near you, such  as in a small notebook in your pocket. Another option is to use a mobile app or website. Some programs will calculate calories for you and show you how many calories you have left each time you add an item to the log. WHAT ARE SOME CALORIE COUNTING TIPS?  Use your calories on foods and drinks that will fill you up and not leave you hungry. Some examples of this include foods like nuts and nut butters, vegetables, lean proteins, and high-fiber foods (more than 5 g fiber per serving).  Eat nutritious foods and avoid empty calories. Empty calories are calories you get from foods or beverages that do not have many nutrients, such as candy and soda. It is better to have a nutritious high-calorie food (such as an avocado) than a food with few nutrients (such as a bag of chips).  Know how many calories are in the foods you eat most  often. This way, you do not have to look up how many calories they have each time you eat them.  Look out for foods that may seem like low-calorie foods but are really high-calorie foods, such as baked goods, soda, and fat-free candy.  Pay attention to calories in drinks. Drinks such as sodas, specialty coffee drinks, alcohol, and juices have a lot of calories yet do not fill you up. Choose low-calorie drinks like water and diet drinks.  Focus your calorie counting efforts on higher calorie items. Logging the calories in a garden salad that contains only vegetables is less important than calculating the calories in a milk shake.  Find a way of tracking calories that works for you. Get creative. Most people who are successful find ways to keep track of how much they eat in a day, even if they do not count every calorie. WHAT ARE SOME PORTION CONTROL TIPS?  Know how many calories are in a serving. This will help you know how many servings of a certain food you can have.  Use a measuring cup to measure serving sizes. This is helpful when you start out. With time, you will be able to estimate serving sizes for some foods.  Take some time to put servings of different foods on your favorite plates, bowls, and cups so you know what a serving looks like.  Try not to eat straight from a bag or box. Doing this can lead to overeating. Put the amount you would like to eat in a cup or on a plate to make sure you are eating the right portion.  Use smaller plates, glasses, and bowls to prevent overeating. This is a quick and easy way to practice portion control. If your plate is smaller, less food can fit on it.  Try not to multitask while eating, such as watching TV or using your computer. If it is time to eat, sit down at a table and enjoy your food. Doing this will help you to start recognizing when you are full. It will also make you more aware of what and how much you are eating. HOW CAN I CALORIE COUNT  WHEN EATING OUT?  Ask for smaller portion sizes or child-sized portions.  Consider sharing an entree and sides instead of getting your own entree.  If you get your own entree, eat only half. Ask for a box at the beginning of your meal and put the rest of your entree in it so you are not tempted to eat it.  Look for the calories on the menu. If  calories are listed, choose the lower calorie options.  Choose dishes that include vegetables, fruits, whole grains, low-fat dairy products, and lean protein. Focusing on smart food choices from each of the 5 food groups can help you stay on track at restaurants.  Choose items that are boiled, broiled, grilled, or steamed.  Choose water, milk, unsweetened iced tea, or other drinks without added sugars. If you want an alcoholic beverage, choose a lower calorie option. For example, a regular margarita can have up to 700 calories and a glass of wine has around 150.  Stay away from items that are buttered, battered, fried, or served with cream sauce. Items labeled "crispy" are usually fried, unless stated otherwise.  Ask for dressings, sauces, and syrups on the side. These are usually very high in calories, so do not eat much of them.  Watch out for salads. Many people think salads are a healthy option, but this is often not the case. Many salads come with bacon, fried chicken, lots of cheese, fried chips, and dressing. All of these items have a lot of calories. If you want a salad, choose a garden salad and ask for grilled meats or steak. Ask for the dressing on the side, or ask for olive oil and vinegar or lemon to use as dressing.  Estimate how many servings of a food you are given. For example, a serving of cooked rice is  cup or about the size of half a tennis ball or one cupcake wrapper. Knowing serving sizes will help you be aware of how much food you are eating at restaurants. The list below tells you how big or small some common portion sizes are  based on everyday objects.  1 oz--4 stacked dice.  3 oz--1 deck of cards.  1 tsp--1 dice.  1 Tbsp-- a Ping-Pong ball.  2 Tbsp--1 Ping-Pong ball.   cup--1 tennis ball or 1 cupcake wrapper.  1 cup--1 baseball.   This information is not intended to replace advice given to you by your health care provider. Make sure you discuss any questions you have with your health care provider.   Document Released: 12/22/2004 Document Revised: 01/12/2014 Document Reviewed: 10/27/2012 Elsevier Interactive Patient Education [employer].

## 2015-08-15 ENCOUNTER — Telehealth: Payer: Self-pay

## 2015-08-15 NOTE — Telephone Encounter (Signed)
Prior Authorization filed for Lorcaserin HCl (BELVIQ) 10 MG TABS  Key: [id number]

## 2015-09-02 ENCOUNTER — Ambulatory Visit (INDEPENDENT_AMBULATORY_CARE_PROVIDER_SITE_OTHER): Payer: BLUE CROSS/BLUE SHIELD | Admitting: Family Medicine

## 2015-09-02 ENCOUNTER — Encounter: Payer: Self-pay | Admitting: Family Medicine

## 2015-09-02 VITALS — BP 124/82 | HR 66 | Temp 98.2°F | Ht 67.0 in | Wt 221.3 lb

## 2015-09-02 DIAGNOSIS — M25572 Pain in left ankle and joints of left foot: Secondary | ICD-10-CM

## 2015-09-02 MED ORDER — PREDNISONE 10 MG PO TABS: ORAL_TABLET | ORAL | 0 refills | Status: DC

## 2015-09-02 NOTE — Progress Notes (Signed)
HPI:  Acute visit for:  "Gout" -reports hx of gout, 1 flare/1-2 years, usually in 1st MTP -this started last week, L 1st MTP - redness, swelling, pain - feels same as prior flares -taking aleve but not much better -denies: fevers, malaise, spreading redness, known injury, weakness, numbness, any other joint pains or swelling  ROS: See pertinent positives and negatives per HPI.  Past Medical History:  Diagnosis Date  . Constipation   . GERD (gastroesophageal reflux disease)   . Hyperlipidemia   . Hypertension     Past Surgical History:  Procedure Laterality Date  . ABDOMINAL HYSTERECTOMY    . COLPORRHAPHY    . PROLAPSED UTERINE FIBROID LIGATION     x2  . SALPINGOOPHORECTOMY Right     Family History  Problem Relation Age of Onset  . Breast cancer Mother   . Hypertension Mother   . Schizophrenia Mother   . Hypertension Maternal Grandmother   . Diabetes Maternal Grandmother   . Breast cancer Maternal Grandmother     ?    Social History   Social History  . Marital status: Married    Spouse name: N/A  . Number of children: N/A  . Years of education: N/A   Social History Main Topics  . Smoking status: Never Smoker  . Smokeless tobacco: Never Used  . Alcohol use No  . Drug use: No  . Sexual activity: Not Currently    Birth control/ protection: Post-menopausal   Other Topics Concern  . 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  acetaminophen (TYLENOL) 325 MG tablet, Take 650 mg by mouth every 6 (six) hours as needed., Disp: , Rfl:  .  amLODipine (NORVASC) 10 MG tablet, Take 1 tablet (10 mg total) by mouth daily., Disp: 90 tablet, Rfl: 1 .  Calcium Carbonate-Vit D-Min (CALCIUM 1200 PO), Take by mouth. Reported on 05/03/2015, Disp: , Rfl:  .  citalopram (CELEXA) 20 MG tablet, Take 1 tablet (20 mg  total) by mouth daily., Disp: 30 tablet, Rfl: 3 .  lisinopril-hydrochlorothiazide (PRINZIDE,ZESTORETIC) 20-25 MG tablet, Take 1 tablet by mouth daily., Disp: 90 tablet, Rfl: 3 .  Lorcaserin HCl (BELVIQ) 10 MG TABS, Take 10 mg by mouth 2 (two) times daily., Disp: 60 tablet, Rfl: 0 .  rosuvastatin (CRESTOR) 20 MG tablet, Take 1 tablet (20 mg total) by mouth daily., Disp: 90 tablet, Rfl: 3 .  predniSONE (DELTASONE) 10 MG tablet, 40mg  (4 tabs) daily for 3-5 days until improving, then taper as follows: 30mg  (3 tabs) daily for 2 days 20mg  (2 tabs) daily for 2 days 10mg  (1 tab) daily for 2 days, Disp: 32 tablet, Rfl: 0  EXAM:  Vitals:   09/02/15 1044  BP: 124/82  Pulse: 66  Temp: 98.2 F (36.8 C)    Body mass index is 34.66 kg/m.  GENERAL: vitals reviewed and listed above, alert, oriented, appears well hydrated and in no acute distress  HEENT: atraumatic, conjunttiva clear, no obvious abnormalities on inspection of external nose and ears  NECK: no obvious masses on inspection  MS: moves all extremities without noticeable abnormality, mild swelling, redness, TTP of L 1st MTP joint  PSYCH: pleasant and cooperative, no obvious depression or anxiety  ASSESSMENT AND PLAN:  Discussed the following assessment and plan:  Pain in joint, ankle and foot, left  -we discussed possible serious and likely etiologies, workup and treatment, treatment risks and return precautions - gout most likely -after this discussion, Lailana opted for prednisone given not responding to the NSAIDs -follow up advised 1-3 weeks -of course, we advised Dearie  to return or notify a doctor immediately if symptoms worsen or persist or new concerns arise.  Patient Instructions  Follow up with your doctor in about 1-3 weeks.  Take the medication as prescribed.  STOP the anti-inflammatory medication (aleve, ibuprofen, etc.)   Gout Gout is an inflammatory arthritis caused by a buildup of uric acid crystals in the  joints. Uric acid is a chemical that is normally present in the blood. When the level of uric acid in the blood is too high it can form crystals that deposit in your joints and tissues. This causes joint redness, soreness, and swelling (inflammation). Repeat attacks are common. Over time, uric acid crystals can form into masses (tophi) near a joint, destroying bone and causing disfigurement. Gout is treatable and often preventable. CAUSES  The disease begins with elevated levels of uric acid in the blood. Uric acid is produced by your body when it breaks down a naturally found substance called purines. Certain foods you eat, such as meats and fish, contain high amounts of purines. Causes of an elevated uric acid level include:  Being passed down from parent to child (heredity).  Diseases that cause increased uric acid production (such as obesity, psoriasis, and certain cancers).  Excessive alcohol use.  Diet, especially diets rich in meat and seafood.  Medicines, including certain cancer-fighting medicines (chemotherapy), water pills (diuretics), and aspirin.  Chronic kidney disease. The kidneys are no longer able to remove uric acid well.  Problems with metabolism. Conditions strongly associated with gout include:  Obesity.  High blood pressure.  High cholesterol.  Diabetes. Not everyone with elevated uric acid levels gets gout. It is not understood why some people get gout and others do not. Surgery, joint injury, and eating too much of certain foods are some of the factors that can lead to gout attacks. SYMPTOMS   An attack of gout comes on quickly. It causes intense pain with redness, swelling, and warmth in a joint.  Fever can occur.  Often, only one joint is involved. Certain joints are more commonly involved:  Base of the big toe.  Knee.  Ankle.  Wrist.  Finger. Without treatment, an attack usually goes away in a few days to weeks. Between attacks, you usually will  not have symptoms, which is different from many other forms of arthritis. DIAGNOSIS  Your caregiver will suspect gout based on your symptoms and exam. In some cases, tests may be recommended. The tests may include:  Blood tests.  Urine tests.  X-rays.  Joint fluid exam. This exam requires a needle to remove fluid from the joint (arthrocentesis). Using a microscope, gout is confirmed when uric acid crystals are seen in the joint fluid. TREATMENT  There are two phases to gout treatment: treating the sudden onset (acute) attack and preventing attacks (prophylaxis).  Treatment of an Acute Attack.  Medicines are used. These include anti-inflammatory medicines or steroid medicines.  An injection of steroid medicine into the affected joint is sometimes necessary.  The painful joint is rested. Movement can worsen the arthritis.  You may use warm or cold treatments on painful joints, depending which works best for you.  Treatment to Prevent Attacks.  If you suffer  from frequent gout attacks, your caregiver may advise preventive medicine. These medicines are started after the acute attack subsides. These medicines either help your kidneys eliminate uric acid from your body or decrease your uric acid production. You may need to stay on these medicines for a very long time.  The early phase of treatment with preventive medicine can be associated with an increase in acute gout attacks. For this reason, during the first few months of treatment, your caregiver may also advise you to take medicines usually used for acute gout treatment. Be sure you understand your caregiver's directions. Your caregiver may make several adjustments to your medicine dose before these medicines are effective.  Discuss dietary treatment with your caregiver or dietitian. Alcohol and drinks high in sugar and fructose and foods such as meat, poultry, and seafood can increase uric acid levels. Your caregiver or dietitian can  advise you on drinks and foods that should be limited. HOME CARE INSTRUCTIONS   Do not take aspirin to relieve pain. This raises uric acid levels.  Only take over-the-counter or prescription medicines for pain, discomfort, or fever as directed by your caregiver.  Rest the joint as much as possible. When in bed, keep sheets and blankets off painful areas.  Keep the affected joint raised (elevated).  Apply warm or cold treatments to painful joints. Use of warm or cold treatments depends on which works best for you.  Use crutches if the painful joint is in your leg.  Drink enough fluids to keep your urine clear or pale yellow. This helps your body get rid of uric acid. Limit alcohol, sugary drinks, and fructose drinks.  Follow your dietary instructions. Pay careful attention to the amount of protein you eat. Your daily diet should emphasize fruits, vegetables, whole grains, and fat-free or low-fat milk products. Discuss the use of coffee, vitamin C, and cherries with your caregiver or dietitian. These may be helpful in lowering uric acid levels.  Maintain a healthy body weight. SEEK MEDICAL CARE IF:   You develop diarrhea, vomiting, or any side effects from medicines.  You do not feel better in 24 hours, or you are getting worse. SEEK IMMEDIATE MEDICAL CARE IF:   Your joint becomes suddenly more tender, and you have chills or a fever. MAKE SURE YOU:   Understand these instructions.  Will watch your condition.  Will get help right away if you are not doing well or get worse.   This information is not intended to replace advice given to you by your health care provider. Make sure you discuss any questions you have with your health care provider.   Document Released: 12/20/1999 Document Revised: 01/12/2014 Document Reviewed: 08/05/2011 Elsevier Interactive Patient Education 2016 [place]., DO

## 2015-09-02 NOTE — Patient Instructions (Signed)
Follow up with your doctor in about 1-3 weeks.  Take the medication as prescribed.  STOP the anti-inflammatory medication (aleve, ibuprofen, etc.)   Gout Gout is an inflammatory arthritis caused by a buildup of uric acid crystals in the joints. Uric acid is a chemical that is normally present in the blood. When the level of uric acid in the blood is too high it can form crystals that deposit in your joints and tissues. This causes joint redness, soreness, and swelling (inflammation). Repeat attacks are common. Over time, uric acid crystals can form into masses (tophi) near a joint, destroying bone and causing disfigurement. Gout is treatable and often preventable. CAUSES  The disease begins with elevated levels of uric acid in the blood. Uric acid is produced by your body when it breaks down a naturally found substance called purines. Certain foods you eat, such as meats and fish, contain high amounts of purines. Causes of an elevated uric acid level include:  Being passed down from parent to child (heredity).  Diseases that cause increased uric acid production (such as obesity, psoriasis, and certain cancers).  Excessive alcohol use.  Diet, especially diets rich in meat and seafood.  Medicines, including certain cancer-fighting medicines (chemotherapy), water pills (diuretics), and aspirin.  Chronic kidney disease. The kidneys are no longer able to remove uric acid well.  Problems with metabolism. Conditions strongly associated with gout include:  Obesity.  High blood pressure.  High cholesterol.  Diabetes. Not everyone with elevated uric acid levels gets gout. It is not understood why some people get gout and others do not. Surgery, joint injury, and eating too much of certain foods are some of the factors that can lead to gout attacks. SYMPTOMS   An attack of gout comes on quickly. It causes intense pain with redness, swelling, and warmth in a joint.  Fever can  occur.  Often, only one joint is involved. Certain joints are more commonly involved:  Base of the big toe.  Knee.  Ankle.  Wrist.  Finger. Without treatment, an attack usually goes away in a few days to weeks. Between attacks, you usually will not have symptoms, which is different from many other forms of arthritis. DIAGNOSIS  Your caregiver will suspect gout based on your symptoms and exam. In some cases, tests may be recommended. The tests may include:  Blood tests.  Urine tests.  X-rays.  Joint fluid exam. This exam requires a needle to remove fluid from the joint (arthrocentesis). Using a microscope, gout is confirmed when uric acid crystals are seen in the joint fluid. TREATMENT  There are two phases to gout treatment: treating the sudden onset (acute) attack and preventing attacks (prophylaxis).  Treatment of an Acute Attack.  Medicines are used. These include anti-inflammatory medicines or steroid medicines.  An injection of steroid medicine into the affected joint is sometimes necessary.  The painful joint is rested. Movement can worsen the arthritis.  You may use warm or cold treatments on painful joints, depending which works best for you.  Treatment to Prevent Attacks.  If you suffer from frequent gout attacks, your caregiver may advise preventive medicine. These medicines are started after the acute attack subsides. These medicines either help your kidneys eliminate uric acid from your body or decrease your uric acid production. You may need to stay on these medicines for a very long time.  The early phase of treatment with preventive medicine can be associated with an increase in acute gout attacks. For this reason,  during the first few months of treatment, your caregiver may also advise you to take medicines usually used for acute gout treatment. Be sure you understand your caregiver's directions. Your caregiver may make several adjustments to your medicine  dose before these medicines are effective.  Discuss dietary treatment with your caregiver or dietitian. Alcohol and drinks high in sugar and fructose and foods such as meat, poultry, and seafood can increase uric acid levels. Your caregiver or dietitian can advise you on drinks and foods that should be limited. HOME CARE INSTRUCTIONS   Do not take aspirin to relieve pain. This raises uric acid levels.  Only take over-the-counter or prescription medicines for pain, discomfort, or fever as directed by your caregiver.  Rest the joint as much as possible. When in bed, keep sheets and blankets off painful areas.  Keep the affected joint raised (elevated).  Apply warm or cold treatments to painful joints. Use of warm or cold treatments depends on which works best for you.  Use crutches if the painful joint is in your leg.  Drink enough fluids to keep your urine clear or pale yellow. This helps your body get rid of uric acid. Limit alcohol, sugary drinks, and fructose drinks.  Follow your dietary instructions. Pay careful attention to the amount of protein you eat. Your daily diet should emphasize fruits, vegetables, whole grains, and fat-free or low-fat milk products. Discuss the use of coffee, vitamin C, and cherries with your caregiver or dietitian. These may be helpful in lowering uric acid levels.  Maintain a healthy body weight. SEEK MEDICAL CARE IF:   You develop diarrhea, vomiting, or any side effects from medicines.  You do not feel better in 24 hours, or you are getting worse. SEEK IMMEDIATE MEDICAL CARE IF:   Your joint becomes suddenly more tender, and you have chills or a fever. MAKE SURE YOU:   Understand these instructions.  Will watch your condition.  Will get help right away if you are not doing well or get worse.   This information is not intended to replace advice given to you by your health care provider. Make sure you discuss any questions you have with your health  care provider.   Document Released: 12/20/1999 Document Revised: 01/12/2014 Document Reviewed: 08/05/2011 Elsevier Interactive Patient Education [employer].

## 2015-09-02 NOTE — Progress Notes (Signed)
Pre visit review using our clinic review tool, if applicable. No additional management support is needed unless otherwise documented below in the visit note. 

## 2015-09-03 ENCOUNTER — Telehealth: Payer: Self-pay | Admitting: Adult Health

## 2015-09-03 NOTE — Telephone Encounter (Signed)
Please Advise

## 2015-09-03 NOTE — Telephone Encounter (Signed)
[facility] - Client [place] [facility] Patient Name: Heather Gibson DOB: [birth year] Initial Comment Caller states she was seen yesterday for Gout. The Steroid medication made her throw up. Nurse Assessment Nurse: Dimas Chyle, RN, Dellis Filbert Date/Time Eilene Ghazi Time): 09/03/2015 1:54:25 PM Confirm and document reason for call. If symptomatic, describe symptoms. You must click the next button to save text entered. ---Caller states she was seen yesterday for gout. The steroid medication made her throw up. Vomiting started today. Had one episode of vomiting this morning. Caller was started on prednisone tapered dose for gout. Has the patient traveled out of the country within the last 30 days? ---No Does the patient have any new or worsening symptoms? ---Yes Will a triage be completed? ---No Select reason for no triage. ---Other Please document clinical information provided and list any resource used. ---Caller did not vomit when she took first dose of medication yesterday but took this morning without food. Advised caller to check medication to see if advised to take with food. If so take medication with food. If vomiting persists then she would need to call back. Guidelines Guideline Title Affirmed Question Affirmed Notes Final Disposition User Comments Advised caller that vomiting is not a known side effect of that medication. Prednisone Side Effects Incidence not known: Abdominal or stomach cramping or burning (severe) abdominal or stomach pain1 vomiting Per [email or web address]

## 2015-09-03 NOTE — Telephone Encounter (Signed)
See below

## 2015-09-13 ENCOUNTER — Ambulatory Visit: Payer: BLUE CROSS/BLUE SHIELD | Admitting: Adult Health

## 2015-09-21 ENCOUNTER — Other Ambulatory Visit: Payer: Self-pay | Admitting: Adult Health

## 2015-09-22 ENCOUNTER — Other Ambulatory Visit: Payer: Self-pay | Admitting: Adult Health

## 2015-09-22 MED ORDER — AMLODIPINE BESYLATE 10 MG PO TABS: 10.0000 mg | ORAL_TABLET | Freq: Every day | ORAL | 3 refills | Status: DC

## 2015-10-30 ENCOUNTER — Ambulatory Visit: Payer: BLUE CROSS/BLUE SHIELD | Admitting: Adult Health

## 2015-11-04 ENCOUNTER — Other Ambulatory Visit: Payer: Self-pay | Admitting: Adult Health

## 2015-11-04 MED ORDER — LISINOPRIL-HYDROCHLOROTHIAZIDE 20-25 MG PO TABS: 1.0000 | ORAL_TABLET | Freq: Every day | ORAL | 2 refills | Status: DC

## 2015-11-04 NOTE — Telephone Encounter (Signed)
Pt need new Rx for lisinopril-hydrochlorothiazide   Pharm: [facility]

## 2015-11-04 NOTE — Telephone Encounter (Signed)
Rx done. 

## 2016-01-20 ENCOUNTER — Ambulatory Visit (INDEPENDENT_AMBULATORY_CARE_PROVIDER_SITE_OTHER): Payer: BLUE CROSS/BLUE SHIELD | Admitting: Family Medicine

## 2016-01-20 VITALS — BP 122/80 | HR 79 | Temp 98.2°F | Ht 67.0 in | Wt 226.0 lb

## 2016-01-20 DIAGNOSIS — B9789 Other viral agents as the cause of diseases classified elsewhere: Secondary | ICD-10-CM | POA: Diagnosis not present

## 2016-01-20 DIAGNOSIS — Z23 Encounter for immunization: Secondary | ICD-10-CM

## 2016-01-20 DIAGNOSIS — J069 Acute upper respiratory infection, unspecified: Secondary | ICD-10-CM

## 2016-01-20 MED ORDER — BENZONATATE 100 MG PO CAPS: 100.0000 mg | ORAL_CAPSULE | Freq: Three times a day (TID) | ORAL | 0 refills | Status: DC | PRN

## 2016-01-20 NOTE — Patient Instructions (Signed)

## 2016-01-20 NOTE — Progress Notes (Signed)
Pre visit review using our clinic review tool, if applicable. No additional management support is needed unless otherwise documented below in the visit note. 

## 2016-01-20 NOTE — Progress Notes (Signed)
Subjective:     Patient ID: Heather Gibson, female   DOB: 09-04-1950, 66 y.o.   MRN: [id number]  HPI Patient seen with onset last Friday of cough, fatigue, sneezing, nasal congestion. Only mild body aches. Initially had some sore throat and that has improved. She's taken some NyQuil and Mucinex over-the-counter. She is not aware of any fever. No nausea, vomiting, or diarrhea. She sits with elderly patients. Nonsmoker  Past Medical History:  Diagnosis Date  . Constipation   . GERD (gastroesophageal reflux disease)   . Hyperlipidemia   . Hypertension    Past Surgical History:  Procedure Laterality Date  . ABDOMINAL HYSTERECTOMY    . COLPORRHAPHY    . PROLAPSED UTERINE FIBROID LIGATION     x2  . SALPINGOOPHORECTOMY Right     reports that she has never smoked. She has never used smokeless tobacco. She reports that she does not drink alcohol or use drugs. family history includes Breast cancer in her maternal grandmother and mother; Diabetes in her maternal grandmother; Hypertension in her maternal grandmother and mother; Schizophrenia in her mother. Allergies  Allergen Reactions  . Eggs Or Egg-Derived Products     Years ago dx- eats egg now without any problems  . Latex Swelling  . Milk-Related Compounds     Cannot have large amt of milk products  . Peanut-Containing Drug Products     Cannot have large amts of     Review of Systems  Constitutional: Positive for fatigue. Negative for chills and fever.  HENT: Positive for congestion. Negative for sinus pressure.   Respiratory: Positive for cough.        Objective:   Physical Exam  Constitutional: She appears well-developed and well-nourished.  HENT:  Right Ear: External ear normal.  Left Ear: External ear normal.  Mouth/Throat: Oropharynx is clear and moist.  Neck: Neck supple.  Cardiovascular: Normal rate and regular rhythm.   Pulmonary/Chest: Effort normal and breath sounds normal. No respiratory distress. She  has no wheezes. She has no rales.  Lymphadenopathy:    She has no cervical adenopathy.       Assessment:     Probable viral syndrome. Very unlikely influenza    Plan:     -Reassurance. -Continue over-the-counter medications as needed -Tessalon Perles 100 mg every 8 hours as needed for cough -Follow-up for any fever or other concerns -Patient requesting flu vaccination and since she does not have any fever this was given  Eulas Post MD [facility]

## 2016-03-04 ENCOUNTER — Ambulatory Visit (INDEPENDENT_AMBULATORY_CARE_PROVIDER_SITE_OTHER): Payer: BLUE CROSS/BLUE SHIELD | Admitting: Adult Health

## 2016-03-04 ENCOUNTER — Encounter: Payer: Self-pay | Admitting: Adult Health

## 2016-03-04 VITALS — BP 122/60 | Temp 98.5°F | Ht 67.0 in | Wt 214.0 lb

## 2016-03-04 DIAGNOSIS — K529 Noninfective gastroenteritis and colitis, unspecified: Secondary | ICD-10-CM | POA: Diagnosis not present

## 2016-03-04 NOTE — Progress Notes (Signed)
Subjective:    Patient ID: Heather Gibson, female    DOB: 1950-05-25, 66 y.o.   MRN: [id number]  HPI   66 year old famale who  has a past medical history of Constipation; GERD (gastroesophageal reflux disease); Hyperlipidemia; and Hypertension. She reports to the office today for an acute issue. Her symptoms include diarrhea, nausea, and vomiting. Her symptoms started three days ago. The last time she vomited was yesterday. Her diarrhea has improved and she no longer has nausea. Her biggest complaint is that of generalized abdominal soreness  She has been around multiple people with this issue.   She has been doing a clear liquid diet  Denies any fevers, blood in stool or vomit  Review of Systems See HPI   Past Medical History:  Diagnosis Date  . Constipation   . GERD (gastroesophageal reflux disease)   . Hyperlipidemia   . Hypertension     Social History   Social History  . Marital status: Married    Spouse name: N/A  . Number of children: N/A  . Years of education: N/A   Occupational History  . Not on file.   Social History Main Topics  . Smoking status: Never Smoker  . Smokeless tobacco: Never Used  . Alcohol use No  . Drug use: No  . Sexual activity: Not Currently    Birth control/ protection: Post-menopausal   Other Topics Concern  . 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 ABDOMINAL HYSTERECTOMY    . COLPORRHAPHY    . PROLAPSED UTERINE FIBROID LIGATION     x2  . SALPINGOOPHORECTOMY Right     Family History  Problem Relation Age of Onset  . Breast cancer Mother   . Hypertension Mother   . Schizophrenia Mother   . Hypertension Maternal Grandmother   . Diabetes Maternal Grandmother   . Breast cancer Maternal Grandmother     ?    Allergies  Allergen  Reactions  . Eggs Or Egg-Derived Products     Years ago dx- eats egg now without any problems  . Latex Swelling  . Milk-Related Compounds     Cannot have large amt of milk products  . Peanut-Containing Drug Products     Cannot have large amts of    Current Outpatient Prescriptions on File Prior to Visit  Medication Sig Dispense Refill  . acetaminophen (TYLENOL) 325 MG tablet Take 650 mg by mouth every 6 (six) hours as needed.    Marland Kitchen amLODipine (NORVASC) 10 MG tablet Take 1 tablet (10 mg total) by mouth daily. 90 tablet 3  . benzonatate (TESSALON) 100 MG capsule Take 1 capsule (100 mg total) by mouth 3 (three) times daily as needed for cough. 30 capsule 0  . lisinopril-hydrochlorothiazide (PRINZIDE,ZESTORETIC) 20-25 MG tablet Take 1 tablet by mouth daily. 90 tablet 2  . rosuvastatin (CRESTOR) 20 MG tablet Take 1 tablet (20 mg total) by mouth daily. 90 tablet 3   No current facility-administered medications on file prior to visit.     BP 122/60   Temp 98.5 F (36.9 C) (Oral)   Ht 5\' 7"  (1.702 m)   Wt 214 lb (97.1 kg)   BMI 33.52 kg/m       Objective:  Physical Exam  Constitutional: She is oriented to person, place, and time. She appears well-developed and well-nourished. No distress.  Cardiovascular: Normal rate, regular rhythm, normal heart sounds and intact distal pulses.  Exam reveals no gallop and no friction rub.   No murmur heard. Pulmonary/Chest: Effort normal and breath sounds normal. No respiratory distress. She has no wheezes. She has no rales. She exhibits no tenderness.  Abdominal: Soft. Normal appearance and bowel sounds are normal. She exhibits no distension and no mass. There is generalized tenderness. There is no rigidity, no rebound, no guarding, no CVA tenderness, no tenderness at McBurney's point and negative Murphy's sign.  Neurological: She is alert and oriented to person, place, and time.  Skin: Skin is warm and dry. No rash noted. She is not diaphoretic. No  erythema. No pallor.  Psychiatric: She has a normal mood and affect. Her behavior is normal. Judgment and thought content normal.  Nursing note and vitals reviewed.     Assessment & Plan:  1. Gastroenteritis - Seems to be improving  - Continue with clear liquid diet and introduce solid food as tolerated - Follow up if abdominal pain does not resolve in 2 days or sooner if symptoms worsen  Dorothyann Peng, NP

## 2016-04-08 ENCOUNTER — Other Ambulatory Visit (INDEPENDENT_AMBULATORY_CARE_PROVIDER_SITE_OTHER): Payer: BLUE CROSS/BLUE SHIELD

## 2016-04-08 DIAGNOSIS — Z0000 Encounter for general adult medical examination without abnormal findings: Secondary | ICD-10-CM

## 2016-04-08 DIAGNOSIS — R300 Dysuria: Secondary | ICD-10-CM

## 2016-04-08 LAB — POC URINALSYSI DIPSTICK (AUTOMATED)
Bilirubin, UA: NEGATIVE
Blood, UA: NEGATIVE
Glucose, UA: NEGATIVE
Ketones, UA: NEGATIVE
PROTEIN UA: NEGATIVE
Spec Grav, UA: 1.025 (ref 1.030–1.035)
UROBILINOGEN UA: 0.2 (ref ?–2.0)
pH, UA: 6 (ref 5.0–8.0)

## 2016-04-08 LAB — CBC WITH DIFFERENTIAL/PLATELET
BASOS PCT: 0.6 % (ref 0.0–3.0)
Basophils Absolute: 0 10*3/uL (ref 0.0–0.1)
EOS PCT: 2 % (ref 0.0–5.0)
Eosinophils Absolute: 0.2 10*3/uL (ref 0.0–0.7)
HCT: 40 % (ref 36.0–46.0)
Hemoglobin: 13.2 g/dL (ref 12.0–15.0)
Lymphocytes Relative: 31.6 % (ref 12.0–46.0)
Lymphs Abs: 2.8 10*3/uL (ref 0.7–4.0)
MCHC: 33 g/dL (ref 30.0–36.0)
MCV: 90.2 fl (ref 78.0–100.0)
MONO ABS: 0.6 10*3/uL (ref 0.1–1.0)
Monocytes Relative: 6.6 % (ref 3.0–12.0)
NEUTROS ABS: 5.2 10*3/uL (ref 1.4–7.7)
Neutrophils Relative %: 59.2 % (ref 43.0–77.0)
PLATELETS: 385 10*3/uL (ref 150.0–400.0)
RBC: 4.43 Mil/uL (ref 3.87–5.11)
RDW: 14.8 % (ref 11.5–15.5)
WBC: 8.7 10*3/uL (ref 4.0–10.5)

## 2016-04-08 LAB — HEPATIC FUNCTION PANEL
ALT: 13 U/L (ref 0–35)
AST: 16 U/L (ref 0–37)
Albumin: 4.1 g/dL (ref 3.5–5.2)
Alkaline Phosphatase: 60 U/L (ref 39–117)
Bilirubin, Direct: 0.1 mg/dL (ref 0.0–0.3)
TOTAL PROTEIN: 6.9 g/dL (ref 6.0–8.3)
Total Bilirubin: 0.5 mg/dL (ref 0.2–1.2)

## 2016-04-08 LAB — LIPID PANEL
CHOLESTEROL: 277 mg/dL — AB (ref 0–200)
HDL: 49.6 mg/dL (ref >39.00)
LDL Cholesterol: 207 mg/dL — ABNORMAL HIGH (ref 0–99)
NonHDL: 227.82
TRIGLYCERIDES: 103 mg/dL (ref 0.0–149.0)
Total CHOL/HDL Ratio: 6
VLDL: 20.6 mg/dL (ref 0.0–40.0)

## 2016-04-08 LAB — BASIC METABOLIC PANEL
BUN: 15 mg/dL (ref 6–23)
CALCIUM: 9.6 mg/dL (ref 8.4–10.5)
CO2: 31 mEq/L (ref 19–32)
Chloride: 102 mEq/L (ref 96–112)
Creatinine, Ser: 0.73 mg/dL (ref 0.40–1.20)
GFR: 103.08 mL/min (ref >60.00)
Glucose, Bld: 97 mg/dL (ref 70–99)
POTASSIUM: 3.7 meq/L (ref 3.5–5.1)
SODIUM: 140 meq/L (ref 135–145)

## 2016-04-08 LAB — TSH: TSH: 0.45 u[IU]/mL (ref 0.35–4.50)

## 2016-04-11 LAB — URINE CULTURE

## 2016-04-14 ENCOUNTER — Other Ambulatory Visit: Payer: Self-pay

## 2016-04-14 ENCOUNTER — Other Ambulatory Visit: Payer: Self-pay | Admitting: Adult Health

## 2016-04-14 MED ORDER — AMOXICILLIN-POT CLAVULANATE 875-125 MG PO TABS: 1.0000 | ORAL_TABLET | Freq: Two times a day (BID) | ORAL | 0 refills | Status: DC

## 2016-04-15 ENCOUNTER — Encounter: Payer: Self-pay | Admitting: Adult Health

## 2016-04-15 ENCOUNTER — Ambulatory Visit (INDEPENDENT_AMBULATORY_CARE_PROVIDER_SITE_OTHER): Payer: BLUE CROSS/BLUE SHIELD | Admitting: Adult Health

## 2016-04-15 VITALS — BP 120/68 | Temp 97.7°F | Ht 67.0 in | Wt 213.3 lb

## 2016-04-15 DIAGNOSIS — I10 Essential (primary) hypertension: Secondary | ICD-10-CM | POA: Diagnosis not present

## 2016-04-15 DIAGNOSIS — Z0000 Encounter for general adult medical examination without abnormal findings: Secondary | ICD-10-CM | POA: Diagnosis not present

## 2016-04-15 DIAGNOSIS — Z23 Encounter for immunization: Secondary | ICD-10-CM | POA: Diagnosis not present

## 2016-04-15 DIAGNOSIS — G479 Sleep disorder, unspecified: Secondary | ICD-10-CM | POA: Diagnosis not present

## 2016-04-15 DIAGNOSIS — E785 Hyperlipidemia, unspecified: Secondary | ICD-10-CM | POA: Diagnosis not present

## 2016-04-15 MED ORDER — ROSUVASTATIN CALCIUM 20 MG PO TABS: 20.0000 mg | ORAL_TABLET | Freq: Every day | ORAL | 3 refills | Status: DC

## 2016-04-15 NOTE — Addendum Note (Signed)
Addended by: Sandria Bales B on: 04/15/2016 09:30 AM   Modules accepted: Orders

## 2016-04-15 NOTE — Progress Notes (Signed)
Subjective:    Patient ID: Heather Gibson, female    DOB: October 27, 1950, 65 y.o.   MRN: [id number]  HPI  Patient presents for yearly preventative medicine examination. She is a pleasant 66 year old female who  has a past medical history of Constipation; GERD (gastroesophageal reflux disease); Hyperlipidemia; and Hypertension.  All immunizations and health maintenance protocols were reviewed with the patient and needed orders were placed. She is due for tetanus update  Appropriate screening laboratory values were reviewed   Medication reconciliation,  past medical history, social history, problem list and allergies were reviewed in detail with the patient  Goals were established with regard to weight loss, exercise, and  diet in compliance with medications. She is walking and eating healthy   Wt Readings from Last 3 Encounters:  04/15/16 213 lb 4.8 oz (96.8 kg)  03/04/16 214 lb (97.1 kg)  01/20/16 226 lb (102.5 kg)    End of life planning was discussed.  She is prescribed Crestor 20 mg for hyperlipidemia but has not been taking it.   She takes Norvasc 10 mg and Lisinopril 20-25 mg for hypertension   She is up to date on her colonoscopy, and mammogram. She has not seen her dentist or eye doctor.   She reports that she has seen a GYN two years ago and had a pap done   She continues to have issue with sleep. She feels as though she falls asleep easily but awakens multiple times throughout the night. She does not wake up feeling as though she is gasping for breath. Does snore and does wake up not feeling refreshed.     Review of Systems  Constitutional: Negative.   HENT: Negative.   Eyes: Negative.   Respiratory: Negative.   Cardiovascular: Negative.   Gastrointestinal: Negative.   Endocrine: Negative.   Genitourinary: Negative.   Musculoskeletal: Negative.   Skin: Negative.   Allergic/Immunologic: Negative.   Neurological: Negative.   Hematological: Negative.     Psychiatric/Behavioral: Positive for sleep disturbance.  All other systems reviewed and are negative.  Past Medical History:  Diagnosis Date  . Constipation   . GERD (gastroesophageal reflux disease)   . Hyperlipidemia   . Hypertension     Social History   Social History  . Marital status: Married    Spouse name: N/A  . Number of children: N/A  . Years of education: N/A   Occupational History  . Not on file.   Social History Main Topics  . Smoking status: Never Smoker  . Smokeless tobacco: Never Used  . Alcohol use No  . Drug use: No  . Sexual activity: Not Currently    Birth control/ protection: Post-menopausal   Other Topics Concern  . 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 ABDOMINAL HYSTERECTOMY    . COLPORRHAPHY    . PROLAPSED UTERINE FIBROID LIGATION     x2  . SALPINGOOPHORECTOMY Right     Family History  Problem Relation Age of Onset  . Breast cancer Mother   . Hypertension Mother   . Schizophrenia Mother   . Hypertension Maternal Grandmother   . Diabetes Maternal Grandmother   . Breast cancer Maternal Grandmother     ?    Allergies  Allergen Reactions  . Eggs Or Egg-Derived Products     Years ago dx- eats egg now without any problems  . Latex Swelling  . Milk-Related Compounds     Cannot have large amt of milk products  . Peanut-Containing Drug Products     Cannot have large amts of    Current Outpatient Prescriptions on File Prior to Visit  Medication Sig Dispense Refill  . acetaminophen (TYLENOL) 325 MG tablet Take 650 mg by mouth every 6 (six) hours as needed.    Marland Kitchen amLODipine (NORVASC) 10 MG tablet Take 1 tablet (10 mg total) by mouth daily. 90 tablet 3  . lisinopril-hydrochlorothiazide (PRINZIDE,ZESTORETIC) 20-25 MG tablet Take 1 tablet by mouth  daily. 90 tablet 2  . rosuvastatin (CRESTOR) 20 MG tablet Take 1 tablet (20 mg total) by mouth daily. 90 tablet 3   No current facility-administered medications on file prior to visit.     BP 120/68 (BP Location: Left Arm, Patient Position: Sitting, Cuff Size: Large)   Temp 97.7 F (36.5 C) (Oral)   Ht 5\' 7"  (1.702 m)   Wt 213 lb 4.8 oz (96.8 kg)   BMI 33.41 kg/m       Objective:   Physical Exam  Constitutional: She is oriented to person, place, and time. She appears well-developed and well-nourished. No distress.  HENT:  Head: Normocephalic and atraumatic.  Right Ear: External ear normal.  Left Ear: External ear normal.  Nose: Nose normal.  Mouth/Throat: Oropharynx is clear and moist. No oropharyngeal exudate.  Eyes: Conjunctivae and EOM are normal. Pupils are equal, round, and reactive to light. Right eye exhibits no discharge. Left eye exhibits no discharge. No scleral icterus.  Neck: Normal range of motion and full passive range of motion without pain. Neck supple. No JVD present. Carotid bruit is not present. No tracheal deviation present. No thyroid mass and no thyromegaly present.  Cardiovascular: Normal rate, regular rhythm, normal heart sounds and intact distal pulses.  Exam reveals no gallop and no friction rub.   No murmur heard. Pulmonary/Chest: Effort normal and breath sounds normal. No stridor. No respiratory distress. She has no wheezes. She has no rales. She exhibits no tenderness.  Abdominal: Soft. Bowel sounds are normal. She exhibits no distension and no mass. There is no tenderness. There is no rebound and no guarding.  Genitourinary:  Genitourinary Comments: Breast Exam: No masses, lumps, dimpling or discharge noted   Musculoskeletal: Normal range of motion. She exhibits no edema, tenderness or deformity.  Lymphadenopathy:    She has no cervical adenopathy.  Neurological: She is alert and oriented to person, place, and time. She has normal reflexes. She  displays normal reflexes. No cranial nerve deficit. She exhibits normal muscle tone. Coordination normal.  Skin: Skin is warm and dry. No rash noted. No erythema. No pallor.  Psychiatric: She has a normal mood and affect. Her behavior is normal. Judgment and thought content normal.  Nursing note and vitals reviewed.     Assessment & Plan:  1. Routine general medical examination at a health care facility - Reviewed labs in detail with patient and all questions answered  - Continue to eat healthy and exercise - Follow up in one year or sooner if needed  2. Essential hypertension - Well controlled. No change in medications  - Follow up in one year   3. Hyperlipidemia, unspecified hyperlipidemia type - Restart Crestor  - Follow up with in one year  4. Sleep disturbance - Possible sleep apnea  -  Ambulatory referral to Pulmonology  Dorothyann Peng, NP

## 2016-04-23 ENCOUNTER — Other Ambulatory Visit: Payer: Self-pay | Admitting: Adult Health

## 2016-04-23 DIAGNOSIS — Z1231 Encounter for screening mammogram for malignant neoplasm of breast: Secondary | ICD-10-CM

## 2016-05-12 ENCOUNTER — Ambulatory Visit
Admission: RE | Admit: 2016-05-12 | Discharge: 2016-05-12 | Disposition: A | Discharge status: Home / Self Care | Payer: BLUE CROSS/BLUE SHIELD | Source: Ambulatory Visit | Attending: Adult Health | Admitting: Adult Health

## 2016-05-12 DIAGNOSIS — Z1231 Encounter for screening mammogram for malignant neoplasm of breast: Secondary | ICD-10-CM

## 2016-05-29 ENCOUNTER — Institutional Professional Consult (permissible substitution): Payer: BLUE CROSS/BLUE SHIELD | Admitting: Pulmonary Disease

## 2016-06-04 ENCOUNTER — Ambulatory Visit (INDEPENDENT_AMBULATORY_CARE_PROVIDER_SITE_OTHER): Payer: BLUE CROSS/BLUE SHIELD | Admitting: Adult Health

## 2016-06-04 ENCOUNTER — Encounter: Payer: Self-pay | Admitting: Adult Health

## 2016-06-04 VITALS — BP 140/70 | Temp 98.6°F | Ht 67.0 in | Wt 201.4 lb

## 2016-06-04 DIAGNOSIS — H6690 Otitis media, unspecified, unspecified ear: Secondary | ICD-10-CM

## 2016-06-04 MED ORDER — AMOXICILLIN 500 MG PO CAPS: 500.0000 mg | ORAL_CAPSULE | Freq: Two times a day (BID) | ORAL | 0 refills | Status: DC

## 2016-06-04 NOTE — Progress Notes (Signed)
Subjective:    Patient ID: Heather Gibson, female    DOB: [birth year], 66 y.o.   MRN: [id number]  Otalgia   There is pain in the left ear. This is a new problem. The current episode started in the past 7 days. The problem occurs constantly. There has been no fever. Associated symptoms include a sore throat. Pertinent negatives include no ear discharge, headaches or rhinorrhea. She has tried nothing for the symptoms.      Review of Systems  HENT: Positive for ear pain and sore throat. Negative for ear discharge, postnasal drip, rhinorrhea and sinus pain.   Respiratory: Negative.   Cardiovascular: Negative.   Neurological: Negative for headaches.   Past Medical History:  Diagnosis Date  . Constipation   . GERD (gastroesophageal reflux disease)   . Hyperlipidemia   . Hypertension     Social History   Social History  . Marital status: Married    Spouse name: N/A  . Number of children: N/A  . Years of education: N/A   Occupational History  . Not on file.   Social History Main Topics  . Smoking status: Never Smoker  . Smokeless tobacco: Never Used  . Alcohol use No  . Drug use: No  . Sexual activity: Not Currently    Birth control/ protection: Post-menopausal   Other Topics Concern  . 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 ABDOMINAL HYSTERECTOMY    . COLPORRHAPHY    . PROLAPSED UTERINE FIBROID LIGATION     x2  . SALPINGOOPHORECTOMY Right     Family History  Problem Relation Age of Onset  . Breast cancer Mother   . Hypertension Mother   . Schizophrenia Mother   . Hypertension Maternal Grandmother   . Diabetes Maternal Grandmother   . Breast cancer Maternal Grandmother        ?    Allergies  Allergen Reactions  . Eggs Or Egg-Derived Products     Years ago dx-  eats egg now without any problems  . Latex Swelling  . Milk-Related Compounds     Cannot have large amt of milk products  . Peanut-Containing Drug Products     Cannot have large amts of    Current Outpatient Prescriptions on File Prior to Visit  Medication Sig Dispense Refill  . acetaminophen (TYLENOL) 325 MG tablet Take 650 mg by mouth every 6 (six) hours as needed.    Marland Kitchen amLODipine (NORVASC) 10 MG tablet Take 1 tablet (10 mg total) by mouth daily. 90 tablet 3  . lisinopril-hydrochlorothiazide (PRINZIDE,ZESTORETIC) 20-25 MG tablet Take 1 tablet by mouth daily. 90 tablet 2  . rosuvastatin (CRESTOR) 20 MG tablet Take 1 tablet (20 mg total) by mouth daily. 90 tablet 3   No current facility-administered medications on file prior to visit.     BP 140/70 (BP Location: Left Arm, Patient Position: Sitting, Cuff Size: Large)   Temp 98.6 F (37 C) (Oral)   Ht 5\' 7"  (1.702 m)   Wt 201 lb 6.4 oz (91.4 kg)   BMI 31.54 kg/m       Objective:   Physical Exam  Constitutional: She appears well-developed and well-nourished. No distress.  HENT:  Right Ear: Hearing, tympanic membrane, external ear  and ear canal normal.  Left Ear: Ear canal normal. Tympanic membrane is erythematous and bulging.  Nose: No mucosal edema or rhinorrhea. Right sinus exhibits no maxillary sinus tenderness and no frontal sinus tenderness. Left sinus exhibits no maxillary sinus tenderness and no frontal sinus tenderness.  Mouth/Throat: Uvula is midline, oropharynx is clear and moist and mucous membranes are normal.  Skin: She is not diaphoretic.  Vitals reviewed.     Assessment & Plan:  1. Acute otitis media, unspecified otitis media type - amoxicillin (AMOXIL) 500 MG capsule; Take 1 capsule (500 mg total) by mouth 2 (two) times daily.  Dispense: 20 capsule; Refill: 0 - Follow up if no improvement in the next 2-3 days   Dorothyann Peng, NP

## 2016-09-02 ENCOUNTER — Institutional Professional Consult (permissible substitution): Payer: BLUE CROSS/BLUE SHIELD | Admitting: Internal Medicine

## 2016-09-03 ENCOUNTER — Ambulatory Visit (INDEPENDENT_AMBULATORY_CARE_PROVIDER_SITE_OTHER): Payer: BLUE CROSS/BLUE SHIELD | Admitting: Adult Health

## 2016-09-03 VITALS — BP 132/80 | HR 60 | Temp 98.0°F | Wt 195.6 lb

## 2016-09-03 DIAGNOSIS — R110 Nausea: Secondary | ICD-10-CM | POA: Diagnosis not present

## 2016-09-03 DIAGNOSIS — F419 Anxiety disorder, unspecified: Secondary | ICD-10-CM

## 2016-09-03 MED ORDER — ALPRAZOLAM 0.5 MG PO TABS: ORAL_TABLET | ORAL | 0 refills | Status: DC

## 2016-09-03 MED ORDER — ONDANSETRON HCL 4 MG PO TABS: 4.0000 mg | ORAL_TABLET | Freq: Three times a day (TID) | ORAL | 2 refills | Status: DC | PRN

## 2016-09-03 MED ORDER — ROSUVASTATIN CALCIUM 20 MG PO TABS: 20.0000 mg | ORAL_TABLET | Freq: Every day | ORAL | 3 refills | Status: DC

## 2016-09-03 NOTE — Progress Notes (Signed)
Subjective:    Patient ID: Heather Gibson, female    DOB: 11/06/1950, 66 y.o.   MRN: [id number]  HPI  66 year old female who  has a past medical history of Constipation; GERD (gastroesophageal reflux disease); Hyperlipidemia; and Hypertension. She is being seen today for anxiety and nausea. 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 She reports anxiety and nausea. Has no episodes of vomiting or diarrhea.    Review of Systems See HPI   Past Medical History:  Diagnosis Date  . Constipation   . GERD (gastroesophageal reflux disease)   . Hyperlipidemia   . Hypertension     Social History   Social History  . Marital status: Married    Spouse name: N/A  . Number of children: N/A  . Years of education: N/A   Occupational History  . Not on file.   Social History Main Topics  . Smoking status: Never Smoker  . Smokeless tobacco: Never Used  . Alcohol use No  . Drug use: No  . Sexual activity: Not Currently    Birth control/ protection: Post-menopausal   Other Topics Concern  . 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 ABDOMINAL HYSTERECTOMY    . COLPORRHAPHY    . PROLAPSED UTERINE FIBROID LIGATION     x2  . SALPINGOOPHORECTOMY Right     Family History  Problem Relation Age of Onset  . Breast cancer Mother   . Hypertension Mother   . Schizophrenia Mother   . Hypertension Maternal Grandmother   . Diabetes Maternal Grandmother   . Breast cancer Maternal Grandmother        ?    Allergies  Allergen Reactions  . Eggs Or Egg-Derived Products     Years ago dx- eats egg now without any problems  . Latex Swelling  . Milk-Related Compounds     Cannot have large amt of milk products  .  Peanut-Containing Drug Products     Cannot have large amts of    Current Outpatient Prescriptions on File Prior to Visit  Medication Sig Dispense Refill  . acetaminophen (TYLENOL) 325 MG tablet Take 650 mg by mouth every 6 (six) hours as needed.    Marland Kitchen amLODipine (NORVASC) 10 MG tablet Take 1 tablet (10 mg total) by mouth daily. 90 tablet 3  . lisinopril-hydrochlorothiazide (PRINZIDE,ZESTORETIC) 20-25 MG tablet Take 1 tablet by mouth daily. 90 tablet 2   No current facility-administered medications on file prior to visit.     BP 132/80 (BP Location: Right Arm, Patient Position: Sitting, Cuff Size: Large)   Pulse 60   Temp 98 F (36.7 C) (Oral)   Wt 195 lb 9.6 oz (88.7 kg)   BMI 30.64 kg/m       Objective:   Physical Exam  Constitutional: She is oriented to person, place, and time. She appears well-developed and well-nourished. No distress.  Cardiovascular: Normal rate, regular rhythm, normal heart sounds and intact distal pulses.  Exam reveals no gallop and no friction rub.   No murmur heard. Pulmonary/Chest: Effort normal and breath sounds normal. No respiratory distress. She has no wheezes. She has  no rales. She exhibits no tenderness.  Abdominal: Soft. Bowel sounds are normal. She exhibits no distension and no mass. There is no tenderness. There is no rebound and no guarding.  Neurological: She is alert and oriented to person, place, and time.  Skin: Skin is warm and dry. No rash noted. She is not diaphoretic. No erythema. No pallor.  Psychiatric: She has a normal mood and affect. Her behavior is normal. Judgment and thought content normal.  Nursing note and vitals reviewed.     Assessment & Plan:  1. Anxiety - ALPRAZolam (XANAX) 0.5 MG tablet; Take 1/2 to 1 pill every 12 hours as needed  Dispense: 30 tablet; Refill: 0 - Follow up if needed  2. Nausea - ondansetron (ZOFRAN) 4 MG tablet; Take 1 tablet (4 mg total) by mouth every 8 (eight) hours as needed for nausea or  vomiting.  Dispense: 20 tablet; Refill: 2  Dorothyann Peng, NP

## 2016-09-16 ENCOUNTER — Other Ambulatory Visit: Payer: Self-pay | Admitting: Adult Health

## 2016-09-16 NOTE — Telephone Encounter (Signed)
Last rx 11/04/15 90+2, con't med f/u in 1 yr.  CPE 04/19.

## 2016-10-22 ENCOUNTER — Ambulatory Visit (INDEPENDENT_AMBULATORY_CARE_PROVIDER_SITE_OTHER): Payer: Medicare Other | Admitting: Adult Health

## 2016-10-22 ENCOUNTER — Encounter: Payer: Self-pay | Admitting: Adult Health

## 2016-10-22 VITALS — BP 146/70 | Temp 98.6°F | Wt 193.0 lb

## 2016-10-22 DIAGNOSIS — Z23 Encounter for immunization: Secondary | ICD-10-CM

## 2016-10-22 DIAGNOSIS — F419 Anxiety disorder, unspecified: Secondary | ICD-10-CM | POA: Diagnosis not present

## 2016-10-22 MED ORDER — ALPRAZOLAM 0.5 MG PO TABS: 0.5000 mg | ORAL_TABLET | Freq: Every evening | ORAL | 0 refills | Status: DC | PRN

## 2016-10-22 NOTE — Progress Notes (Signed)
Subjective:    Patient ID: Heather Gibson, female    DOB: 06-Apr-1950, 66 y.o.   MRN: [id number]  HPI  66 year old female who  has a past medical history of Constipation; GERD (gastroesophageal reflux disease); Hyperlipidemia; and Hypertension. She presents to the office today for continued anxiety and stress. 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 Due to the stress and anxiety that was surrounding these events, she was placed on a low dose Xanax. She reports that this helped calmed her down and helped her sleep. Unfortunately, the women who she was caring for died this week and Rayme is having to take care of funeral arrangements and so on. She feels as though the stress and anxiety is coming back. She is respectfully asking for another prescription for low dose Xanax to help get her through this time  She denies any overt depression and has not had any suicidal ideation   Review of Systems See HPI   Past Medical History:  Diagnosis Date  . Constipation   . GERD (gastroesophageal reflux disease)   . Hyperlipidemia   . Hypertension     Social History   Social History  . Marital status: Married    Spouse name: N/A  . Number of children: N/A  . Years of education: N/A   Occupational History  . Not on file.   Social History Main Topics  . Smoking status: Never Smoker  . Smokeless tobacco: Never Used  . Alcohol use No  . Drug use: No  . Sexual activity: Not Currently    Birth control/ protection: Post-menopausal   Other Topics Concern  . 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 ABDOMINAL HYSTERECTOMY    . COLPORRHAPHY    . PROLAPSED UTERINE FIBROID LIGATION     x2  . SALPINGOOPHORECTOMY Right      Family History  Problem Relation Age of Onset  . Breast cancer Mother   . Hypertension Mother   . Schizophrenia Mother   . Hypertension Maternal Grandmother   . Diabetes Maternal Grandmother   . Breast cancer Maternal Grandmother        ?    Allergies  Allergen Reactions  . Eggs Or Egg-Derived Products     Years ago dx- eats egg now without any problems  . Latex Swelling  . Milk-Related Compounds     Cannot have large amt of milk products  . Peanut-Containing Drug Products     Cannot have large amts of  . Shellfish Allergy Itching    Current Outpatient Prescriptions on File Prior to Visit  Medication Sig Dispense Refill  . acetaminophen (TYLENOL) 325 MG tablet Take 650 mg by mouth every 6 (six) hours as needed.    Marland Kitchen amLODipine (NORVASC) 10 MG tablet Take 1 tablet (10 mg total) by mouth daily. 90 tablet 3  . lisinopril-hydrochlorothiazide (PRINZIDE,ZESTORETIC) 20-25 MG tablet TAKE 1 TABLET BY MOUTH DAILY 90 tablet 1  . ondansetron (ZOFRAN) 4 MG tablet Take 1 tablet (4 mg total) by mouth every 8 (eight) hours as needed for nausea or vomiting. 20 tablet 2  . rosuvastatin (CRESTOR) 20 MG tablet Take  1 tablet (20 mg total) by mouth daily. 90 tablet 3   No current facility-administered medications on file prior to visit.     BP (!) 146/70   Temp 98.6 F (37 C) (Oral)   Wt 193 lb (87.5 kg)   BMI 30.23 kg/m       Objective:   Physical Exam  Constitutional: She is oriented to person, place, and time. She appears well-developed and well-nourished. No distress.  Neurological: She is alert and oriented to person, place, and time.  Skin: Skin is warm and dry. No rash noted. She is not diaphoretic. No erythema. No pallor.  Psychiatric: She has a normal mood and affect. Her behavior is normal. Judgment and thought content normal.  Nursing note and vitals reviewed.     Assessment & Plan:  1. Anxiety - I am ok with treating her with an additional prescription of xanax that  she can take as needed. Advised that after this, we would need to look at other long term options; if needed - ALPRAZolam (XANAX) 0.5 MG tablet; Take 1 tablet (0.5 mg total) by mouth at bedtime as needed for anxiety.  Dispense: 30 tablet; Refill: 0  2. Need for influenza vaccination - Flu Vaccine QUAD 6+ mos PF IM (Fluarix Quad PF)   Dorothyann Peng, NP

## 2016-11-11 DIAGNOSIS — M6281 Muscle weakness (generalized): Secondary | ICD-10-CM | POA: Diagnosis not present

## 2016-11-11 DIAGNOSIS — M5136 Other intervertebral disc degeneration, lumbar region: Secondary | ICD-10-CM | POA: Diagnosis not present

## 2016-11-18 DIAGNOSIS — H35363 Drusen (degenerative) of macula, bilateral: Secondary | ICD-10-CM | POA: Diagnosis not present

## 2016-11-18 DIAGNOSIS — H43821 Vitreomacular adhesion, right eye: Secondary | ICD-10-CM | POA: Diagnosis not present

## 2016-11-18 DIAGNOSIS — H43813 Vitreous degeneration, bilateral: Secondary | ICD-10-CM | POA: Diagnosis not present

## 2017-01-08 ENCOUNTER — Other Ambulatory Visit: Payer: Self-pay | Admitting: Adult Health

## 2017-01-12 NOTE — Telephone Encounter (Signed)
Sent to the pharmacy by e-scribe for 90 days.  Pt due for cpx/yearly visit 04/2017.

## 2017-01-28 ENCOUNTER — Telehealth: Payer: Self-pay | Admitting: Adult Health

## 2017-01-28 NOTE — Telephone Encounter (Signed)
The patient mailed in coverage request forms  Call the patient for pick up at:   [phone] cell [phone] home  Disposition: Dr's folder

## 2017-02-02 NOTE — Telephone Encounter (Signed)
Filled out and placed on Cory's desk.

## 2017-02-08 NOTE — Telephone Encounter (Signed)
Per Tommi Rumps, pt has been notified to pick up

## 2017-04-17 ENCOUNTER — Other Ambulatory Visit: Payer: Self-pay | Admitting: Adult Health

## 2017-04-20 NOTE — Telephone Encounter (Signed)
Needs annual

## 2017-04-20 NOTE — Telephone Encounter (Signed)
Left a message for a return call.  Filled for 90 days.  Pt needs to be scheduled for annual visit.  CRM created.

## 2017-05-06 ENCOUNTER — Other Ambulatory Visit: Payer: Self-pay | Admitting: Adult Health

## 2017-05-06 DIAGNOSIS — Z1231 Encounter for screening mammogram for malignant neoplasm of breast: Secondary | ICD-10-CM

## 2017-05-26 ENCOUNTER — Ambulatory Visit: Payer: Medicare Other

## 2017-05-27 ENCOUNTER — Ambulatory Visit
Admission: RE | Admit: 2017-05-27 | Discharge: 2017-05-27 | Disposition: A | Discharge status: Home / Self Care | Payer: Medicare HMO | Source: Ambulatory Visit | Attending: Adult Health | Admitting: Adult Health

## 2017-05-27 DIAGNOSIS — Z1231 Encounter for screening mammogram for malignant neoplasm of breast: Secondary | ICD-10-CM | POA: Diagnosis not present

## 2017-06-04 ENCOUNTER — Ambulatory Visit: Payer: Medicare Other | Admitting: Adult Health

## 2017-06-16 ENCOUNTER — Encounter: Payer: Self-pay | Admitting: Adult Health

## 2017-06-16 ENCOUNTER — Ambulatory Visit (INDEPENDENT_AMBULATORY_CARE_PROVIDER_SITE_OTHER): Payer: Medicare HMO | Admitting: Adult Health

## 2017-06-16 VITALS — BP 138/86 | Temp 98.4°F | Ht 64.5 in | Wt 215.0 lb

## 2017-06-16 DIAGNOSIS — E785 Hyperlipidemia, unspecified: Secondary | ICD-10-CM

## 2017-06-16 DIAGNOSIS — Z114 Encounter for screening for human immunodeficiency virus [HIV]: Secondary | ICD-10-CM

## 2017-06-16 DIAGNOSIS — I10 Essential (primary) hypertension: Secondary | ICD-10-CM

## 2017-06-16 DIAGNOSIS — Z23 Encounter for immunization: Secondary | ICD-10-CM | POA: Diagnosis not present

## 2017-06-16 DIAGNOSIS — M25511 Pain in right shoulder: Secondary | ICD-10-CM | POA: Diagnosis not present

## 2017-06-16 DIAGNOSIS — Z1159 Encounter for screening for other viral diseases: Secondary | ICD-10-CM | POA: Diagnosis not present

## 2017-06-16 DIAGNOSIS — R69 Illness, unspecified: Secondary | ICD-10-CM | POA: Diagnosis not present

## 2017-06-16 DIAGNOSIS — Z0000 Encounter for general adult medical examination without abnormal findings: Secondary | ICD-10-CM

## 2017-06-16 LAB — LIPID PANEL
Cholesterol: 234 mg/dL — ABNORMAL HIGH (ref 0–200)
HDL: 54 mg/dL (ref >39.00)
LDL Cholesterol: 163 mg/dL — ABNORMAL HIGH (ref 0–99)
NONHDL: 180.24
Total CHOL/HDL Ratio: 4
Triglycerides: 87 mg/dL (ref 0.0–149.0)
VLDL: 17.4 mg/dL (ref 0.0–40.0)

## 2017-06-16 LAB — HEPATIC FUNCTION PANEL
ALBUMIN: 4.1 g/dL (ref 3.5–5.2)
ALK PHOS: 64 U/L (ref 39–117)
ALT: 14 U/L (ref 0–35)
AST: 17 U/L (ref 0–37)
Bilirubin, Direct: 0.1 mg/dL (ref 0.0–0.3)
Total Bilirubin: 0.4 mg/dL (ref 0.2–1.2)
Total Protein: 6.9 g/dL (ref 6.0–8.3)

## 2017-06-16 LAB — BASIC METABOLIC PANEL
BUN: 18 mg/dL (ref 6–23)
CALCIUM: 9.6 mg/dL (ref 8.4–10.5)
CO2: 30 mEq/L (ref 19–32)
Chloride: 102 mEq/L (ref 96–112)
Creatinine, Ser: 0.72 mg/dL (ref 0.40–1.20)
GFR: 104.35 mL/min (ref >60.00)
Glucose, Bld: 100 mg/dL — ABNORMAL HIGH (ref 70–99)
POTASSIUM: 4.2 meq/L (ref 3.5–5.1)
SODIUM: 141 meq/L (ref 135–145)

## 2017-06-16 LAB — CBC WITH DIFFERENTIAL/PLATELET
BASOS ABS: 0.1 10*3/uL (ref 0.0–0.1)
Basophils Relative: 0.8 % (ref 0.0–3.0)
EOS PCT: 2.2 % (ref 0.0–5.0)
Eosinophils Absolute: 0.2 10*3/uL (ref 0.0–0.7)
HCT: 41.2 % (ref 36.0–46.0)
HEMOGLOBIN: 13.8 g/dL (ref 12.0–15.0)
LYMPHS ABS: 2.2 10*3/uL (ref 0.7–4.0)
LYMPHS PCT: 28.1 % (ref 12.0–46.0)
MCHC: 33.5 g/dL (ref 30.0–36.0)
MCV: 91.9 fl (ref 78.0–100.0)
MONOS PCT: 7.9 % (ref 3.0–12.0)
Monocytes Absolute: 0.6 10*3/uL (ref 0.1–1.0)
NEUTROS PCT: 61 % (ref 43.0–77.0)
Neutro Abs: 4.7 10*3/uL (ref 1.4–7.7)
Platelets: 393 10*3/uL (ref 150.0–400.0)
RBC: 4.48 Mil/uL (ref 3.87–5.11)
RDW: 13.5 % (ref 11.5–15.5)
WBC: 7.7 10*3/uL (ref 4.0–10.5)

## 2017-06-16 LAB — TSH: TSH: 0.53 u[IU]/mL (ref 0.35–4.50)

## 2017-06-16 MED ORDER — LISINOPRIL-HYDROCHLOROTHIAZIDE 20-25 MG PO TABS: 1.0000 | ORAL_TABLET | Freq: Every day | ORAL | 3 refills | Status: DC

## 2017-06-16 MED ORDER — AMLODIPINE BESYLATE 10 MG PO TABS: ORAL_TABLET | ORAL | 3 refills | Status: DC

## 2017-06-16 NOTE — Patient Instructions (Addendum)
It was great seeing you today   Please start walking again and work on weight loss   I will follow up with you regarding your blood work

## 2017-06-16 NOTE — Progress Notes (Signed)
Subjective:    Patient ID: Heather Gibson, female    DOB: Nov 29, 1950, 67 y.o.   MRN: [id number]  HPI Patient presents for yearly preventative medicine examination. She is a pleasant 67 year old female who  has a past medical history of Constipation, GERD (gastroesophageal reflux disease), Hyperlipidemia, and Hypertension.  Essential Hypertension - Controlled with Norvasc 10 mg and Lisinopril/HCTZ 20-25 mg  BP Readings from Last 3 Encounters:  06/16/17 138/86  10/22/16 (!) 146/70  09/03/16 132/80   Hyperlipidemia - Currently prescribed Crestor 20 mg Lab Results  Component Value Date   CHOL 277 (H) 04/08/2016   HDL 49.60 04/08/2016   LDLCALC 207 (H) 04/08/2016   TRIG 103.0 04/08/2016   CHOLHDL 6 04/08/2016    All immunizations and health maintenance protocols were reviewed with the patient and needed orders were placed. She is due for Prevnar 13  Appropriate screening laboratory values were ordered for the patient including screening of hyperlipidemia, renal function and hepatic function.  Medication reconciliation,  past medical history, social history, problem list and allergies were reviewed in detail with the patient  Goals were established with regard to weight loss, exercise, and  diet in compliance with medications. She has not been walking as she has in the past and not eating healthy. Weight is up 20 pounds   Wt Readings from Last 3 Encounters:  06/16/17 215 lb (97.5 kg)  10/22/16 193 lb (87.5 kg)  09/03/16 195 lb 9.6 oz (88.7 kg)    She is up to date on her colonoscopy and mammogram.   She does report one recent mechanical fall about 1 month ago. She tripped her home and fell onto a hardwood floor. She fell onto her right shoulder. Continues to have pain in her right shoulder. Denies any issues with ROM. She did not have any LOC or head trauma.  She is going to follow-up in a separate appointment about this issue  Review of Systems  Constitutional:  Negative.   HENT: Negative.   Eyes: Negative.   Respiratory: Negative.   Cardiovascular: Negative.   Gastrointestinal: Negative.   Endocrine: Negative.   Genitourinary: Negative.   Musculoskeletal: Positive for arthralgias.  Skin: Negative.   Allergic/Immunologic: Negative.   Neurological: Negative.   Hematological: Negative.   Psychiatric/Behavioral: Negative.     Past Medical History:  Diagnosis Date  . Constipation   . GERD (gastroesophageal reflux disease)   . Hyperlipidemia   . Hypertension     Social History   Socioeconomic History  . Marital status: Married    Spouse name: Not on file  . Number of children: Not on file  . Years of education: Not on file  . Highest education level: Not on file  Occupational History  . Not on file  Social Needs  . Financial resource strain: Not on file  . Food insecurity:    Worry: Not on file    Inability: Not on file  . Transportation needs:    Medical: Not on file    Non-medical: Not on file  Tobacco Use  . Smoking status: Never Smoker  . Smokeless tobacco: Never Used  Substance and Sexual Activity  . Alcohol use: No  . Drug use: No  . Sexual activity: Not Currently    Birth control/protection: Post-menopausal  Lifestyle  . Physical activity:    Days per week: Not on file    Minutes per session: Not on file  . Stress: Not on file  Relationships  .  Social connections:    Talks on phone: Not on file    Gets together: Not on file    Attends religious service: Not on file    Active member of club or organization: Not on file    Attends meetings of clubs or organizations: Not on file    Relationship status: Not on file  . Intimate partner violence:    Fear of current or ex partner: Not on file    Emotionally abused: Not on file    Physically abused: Not on file    Forced sexual activity: Not on file  Other Topics Concern  . 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 ABDOMINAL HYSTERECTOMY    . COLPORRHAPHY    . PROLAPSED UTERINE FIBROID LIGATION     x2  . SALPINGOOPHORECTOMY Right     Family History  Problem Relation Age of Onset  . Breast cancer Mother   . Hypertension Mother   . Schizophrenia Mother   . Hypertension Maternal Grandmother   . Diabetes Maternal Grandmother   . Breast cancer Maternal Grandmother        ?    Allergies  Allergen Reactions  . Eggs Or Egg-Derived Products     Years ago dx- eats egg now without any problems  . Latex Swelling  . Milk-Related Compounds     Cannot have large amt of milk products  . Peanut-Containing Drug Products     Cannot have large amts of  . Shellfish Allergy Itching    Current Outpatient Medications on File Prior to Visit  Medication Sig Dispense Refill  . acetaminophen (TYLENOL) 325 MG tablet Take 650 mg by mouth every 6 (six) hours as needed.    . ondansetron (ZOFRAN) 4 MG tablet Take 1 tablet (4 mg total) by mouth every 8 (eight) hours as needed for nausea or vomiting. 20 tablet 2  . rosuvastatin (CRESTOR) 20 MG tablet Take 1 tablet (20 mg total) by mouth daily. 90 tablet 3   No current facility-administered medications on file prior to visit.     BP 138/86   Temp 98.4 F (36.9 C) (Oral)   Ht 5' 4.5" (1.638 m)   Wt 215 lb (97.5 kg)   BMI 36.33 kg/m       Objective:   Physical Exam  Constitutional: She is oriented to person, place, and time. She appears well-developed and well-nourished. No distress.  Obese   HENT:  Head: Normocephalic and atraumatic.  Right Ear: External ear normal.  Left Ear: External ear normal.  Nose: Nose normal.  Mouth/Throat: Oropharynx is clear and moist. No oropharyngeal exudate.  Eyes: Pupils are equal, round, and reactive to light. Conjunctivae and EOM are normal. Right eye exhibits no discharge. Left eye  exhibits no discharge. No scleral icterus.  Neck: Normal range of motion. Neck supple. No JVD present. No tracheal deviation present. No thyromegaly present.  Cardiovascular: Normal rate, regular rhythm, normal heart sounds and intact distal pulses. Exam reveals no gallop and no friction rub.  No murmur heard. Pulmonary/Chest: Effort normal and breath sounds normal. No stridor. No respiratory distress. She has no wheezes. She has no rales. She exhibits no tenderness.  Abdominal: Soft. Bowel sounds are normal. She exhibits no distension and no mass. There  is no tenderness. There is no rebound and no guarding. No hernia.  Genitourinary:  Genitourinary Comments: Refused  Musculoskeletal: Normal range of motion. She exhibits tenderness (throughout right shoulder ). She exhibits no edema or deformity.  No deficits with range of motion of right shoulder.  Normal strength against resistance and with grip  Lymphadenopathy:    She has no cervical adenopathy.  Neurological: She is alert and oriented to person, place, and time. She displays normal reflexes. No cranial nerve deficit or sensory deficit. She exhibits normal muscle tone. Coordination normal.  Skin: Skin is warm and dry. Capillary refill takes less than 2 seconds. She is not diaphoretic. No erythema. No pallor.  Psychiatric: She has a normal mood and affect. Her behavior is normal. Judgment and thought content normal.  Nursing note and vitals reviewed.     Assessment & Plan:  1. Routine general medical examination at a health care facility -20 pound weight gain over the last 10 months.  Gated on the importance of routine exercise and heart healthy diet.  Plans to return to walking and cleaning up her diet. -Follow-up in 1 year for next physical exam or sooner if needed for any acute issues - Basic metabolic panel - CBC with Differential/Platelet - Hepatic function panel - Lipid panel - TSH  2. Essential hypertension - Near goal.  Her  fluctuations in weight have caused her blood pressure to be slightly elevated.  She would like to work on lifestyle modifications before any medication is added. - Basic metabolic panel - CBC with Differential/Platelet - Hepatic function panel - Lipid panel - TSH - amLODipine (NORVASC) 10 MG tablet; TAKE 1 TABLET(10 MG) BY MOUTH DAILY  Dispense: 90 tablet; Refill: 3 - lisinopril-hydrochlorothiazide (PRINZIDE,ZESTORETIC) 20-25 MG tablet; Take 1 tablet by mouth daily.  Dispense: 90 tablet; Refill: 3  3. Hyperlipidemia, unspecified hyperlipidemia type -Consider increase in statin - Basic metabolic panel - CBC with Differential/Platelet - Hepatic function panel - Lipid panel - TSH  4. Need for hepatitis C screening test  - Hep C Antibody  5. Encounter for screening for HIV  - HIV antibody  6. Need for vaccination for Strep pneumoniae  - Pneumococcal conjugate vaccine 13-valent  7. Acute pain of right shoulder -She will follow-up later this week to discuss this issue  Dorothyann Peng, NP

## 2017-06-17 ENCOUNTER — Encounter: Payer: Self-pay | Admitting: Adult Health

## 2017-06-17 ENCOUNTER — Ambulatory Visit (INDEPENDENT_AMBULATORY_CARE_PROVIDER_SITE_OTHER): Payer: Medicare HMO | Admitting: Adult Health

## 2017-06-17 VITALS — BP 142/82 | Temp 98.4°F | Wt 215.0 lb

## 2017-06-17 DIAGNOSIS — M25511 Pain in right shoulder: Secondary | ICD-10-CM | POA: Diagnosis not present

## 2017-06-17 LAB — HEPATITIS C ANTIBODY
HEP C AB: NONREACTIVE
SIGNAL TO CUT-OFF: 0.04 (ref <1.00)

## 2017-06-17 LAB — HIV ANTIBODY (ROUTINE TESTING W REFLEX): HIV 1&2 Ab, 4th Generation: NONREACTIVE

## 2017-06-17 MED ORDER — METHYLPREDNISOLONE ACETATE 80 MG/ML IJ SUSP
80.0000 mg | Freq: Once | INTRAMUSCULAR | Status: AC
  Administered 2017-06-17: 80 mg via INTRA_ARTICULAR

## 2017-06-17 NOTE — Progress Notes (Signed)
Subjective:    Patient ID: Heather Gibson, female    DOB: 12/12/1950, 67 y.o.   MRN: [id number]  HPI  67 year old female who  has a past medical history of Constipation, GERD (gastroesophageal reflux disease), Hyperlipidemia, and Hypertension.  She reports today for the acute issue of right shoulder pain x 1 month. Her pain is a result of mechanical fall she sustained while at home. She tripped and fell onto a wooden floor. She denies any LOC or head trauma. She does not feel as tough the pain is getting any better over the course of the month. Pain feel as though " it is coming form inside". She does not have any loss of ROM but when she does motions such as grabbing something above her head, she has severe pain.   Review of Systems See HPI   Past Medical History:  Diagnosis Date  . Constipation   . GERD (gastroesophageal reflux disease)   . Hyperlipidemia   . Hypertension     Social History   Socioeconomic History  . Marital status: Married    Spouse name: Not on file  . Number of children: Not on file  . Years of education: Not on file  . Highest education level: Not on file  Occupational History  . Not on file  Social Needs  . Financial resource strain: Not on file  . Food insecurity:    Worry: Not on file    Inability: Not on file  . Transportation needs:    Medical: Not on file    Non-medical: Not on file  Tobacco Use  . Smoking status: Never Smoker  . Smokeless tobacco: Never Used  Substance and Sexual Activity  . Alcohol use: No  . Drug use: No  . Sexual activity: Not Currently    Birth control/protection: Post-menopausal  Lifestyle  . Physical activity:    Days per week: Not on file    Minutes per session: Not on file  . Stress: Not on file  Relationships  . Social connections:    Talks on phone: Not on file    Gets together: Not on file    Attends religious service: Not on file    Active member of club or organization: Not on file   Attends meetings of clubs or organizations: Not on file    Relationship status: Not on file  . Intimate partner violence:    Fear of current or ex partner: Not on file    Emotionally abused: Not on file    Physically abused: Not on file    Forced sexual activity: Not on file  Other Topics Concern  . 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 ABDOMINAL HYSTERECTOMY    . COLPORRHAPHY    . PROLAPSED UTERINE FIBROID LIGATION     x2  . SALPINGOOPHORECTOMY Right     Family History  Problem Relation Age of Onset  . Breast cancer Mother   . Hypertension Mother   . Schizophrenia Mother   . Hypertension Maternal Grandmother   . Diabetes Maternal Grandmother   . Breast cancer Maternal Grandmother        ?    Allergies  Allergen Reactions  . Eggs Or Egg-Derived Products  Years ago dx- eats egg now without any problems  . Latex Swelling  . Milk-Related Compounds     Cannot have large amt of milk products  . Peanut-Containing Drug Products     Cannot have large amts of  . Shellfish Allergy Itching    Current Outpatient Medications on File Prior to Visit  Medication Sig Dispense Refill  . acetaminophen (TYLENOL) 325 MG tablet Take 650 mg by mouth every 6 (six) hours as needed.    Marland Kitchen amLODipine (NORVASC) 10 MG tablet TAKE 1 TABLET(10 MG) BY MOUTH DAILY 90 tablet 3  . lisinopril-hydrochlorothiazide (PRINZIDE,ZESTORETIC) 20-25 MG tablet Take 1 tablet by mouth daily. 90 tablet 3  . ondansetron (ZOFRAN) 4 MG tablet Take 1 tablet (4 mg total) by mouth every 8 (eight) hours as needed for nausea or vomiting. 20 tablet 2  . rosuvastatin (CRESTOR) 20 MG tablet Take 1 tablet (20 mg total) by mouth daily. 90 tablet 3   No current facility-administered medications on file prior to visit.     BP (!) 142/82    Temp 98.4 F (36.9 C) (Oral)   Wt 215 lb (97.5 kg)   BMI 36.33 kg/m       Objective:   Physical Exam  Constitutional: She is oriented to person, place, and time. She appears well-developed and well-nourished. No distress.  Cardiovascular: Normal rate, regular rhythm, normal heart sounds and intact distal pulses. Exam reveals no gallop and no friction rub.  No murmur heard. Pulmonary/Chest: Effort normal and breath sounds normal. No stridor. No respiratory distress. She has no wheezes. She has no rales. She exhibits no tenderness.  Musculoskeletal: Normal range of motion. She exhibits tenderness. She exhibits no deformity.  Tenderness with palpation along right shoulder. She has obvious pain with ROM exercises when reaching above her head. No decreased grip strength .  Neurological: She is alert and oriented to person, place, and time.  Skin: Skin is warm and dry. Capillary refill takes less than 2 seconds. She is not diaphoretic.  Psychiatric: She has a normal mood and affect. Her behavior is normal. Judgment and thought content normal.  Nursing note and vitals reviewed.     Assessment & Plan:  1. Acute pain of right shoulder - Does not appear to be rotator cuff tear. We discussed imaging and other treatment options. She opted for steroid injection.  Discussed risks and benefits of corticosteroid injection and patient consented.  After prepping skin with betadine, and cold spray for anesthesia;  injected 80 mg depomedrol and 2 cc of plain xylocaine with 22 gauge one and one half inch needle using anterolateral approach and pt tolerated well. Reviewed follow up instructions with patient. She will follow up in 2 weeks if not resolved. At that time we will consider orthopedics or PT referral and imaging.  - methylPREDNISolone acetate (DEPO-MEDROL) injection 80 mg  Dorothyann Peng, NP

## 2017-08-31 ENCOUNTER — Ambulatory Visit: Payer: Self-pay

## 2017-08-31 NOTE — Telephone Encounter (Signed)
Incoming call from patient with complaint of leg swelling bilaterally. The onset occurred last Thursday. States both legs were swollen, and moderate.  No redness was noted.  Pain was mild. Denies fever.  Patient thinks that the swelling was a result of sitting for a long period of time.  Denies history of heart failure, kidney failure  or cancer. Has happened before relates it to  walking a lot. Patient also states that swelling has resolved. States " my legs are back to normal size now"   Denies chest pain, difficulty breathing.  Patient request appointment  " to be on safe side".  Appointment scheduled for  09/02/17 at 730 am. Patient voiced understanding.   Reason for Disposition . [1] MILD swelling of both ankles (i.e., pedal edema) AND [2] new onset or worsening  Answer Assessment - Initial Assessment Questions 1. ONSET: "When did the swelling start?" (e.g., minutes, hours, days)     Thursday 2. LOCATION: "What part of the leg is swollen?"  "Are both legs swollen or just one leg?"     Both  Full leg knee on down 3. SEVERITY: "How bad is the swelling?" (e.g., localized; mild, moderate, severe)  - Localized - small area of swelling localized to one leg  - MILD pedal edema - swelling limited to foot and ankle, pitting edema < 1/4 inch (6 mm) deep, rest and elevation eliminate most or all swelling  - MODERATE edema - swelling of lower leg to knee, pitting edema > 1/4 inch (6 mm) deep, rest and elevation only partially reduce swelling  - SEVERE edema - swelling extends above knee, facial or hand swelling present      moderate 4. REDNESS: "Does the swelling look red or infected?"     no 5. PAIN: "Is the swelling painful to touch?" If so, ask: "How painful is it?"   (Scale 1-10; mild, moderate or severe)      mild 6. FEVER: "Do you have a fever?" If so, ask: "What is it, how was it measured, and when did it start?"      no 7. CAUSE: "What do you think is causing the leg swelling?"      sittiing  for a long period of time 8. MEDICAL HISTORY: "Do you have a history of heart failure, kidney disease, liver failure, or cancer?"     no 9. RECURRENT SYMPTOM: "Have you had leg swelling before?" If so, ask: "When was the last time?" "What happened that time?"     Happened before fromm walking alot 10. OTHER SYMPTOMS: "Do you have any other symptoms?" (e.g., chest pain, difficulty breathing)       no 11. PREGNANCY: "Is there any chance you are pregnant?" "When was your last menstrual period?"      na  Protocols used: LEG SWELLING AND EDEMA-A-AH

## 2017-09-02 ENCOUNTER — Encounter: Payer: Self-pay | Admitting: Adult Health

## 2017-09-02 ENCOUNTER — Ambulatory Visit (INDEPENDENT_AMBULATORY_CARE_PROVIDER_SITE_OTHER): Payer: Medicare HMO | Admitting: Adult Health

## 2017-09-02 ENCOUNTER — Ambulatory Visit (INDEPENDENT_AMBULATORY_CARE_PROVIDER_SITE_OTHER): Payer: Medicare HMO

## 2017-09-02 VITALS — BP 124/74 | Temp 98.1°F | Wt 217.0 lb

## 2017-09-02 DIAGNOSIS — M5412 Radiculopathy, cervical region: Secondary | ICD-10-CM

## 2017-09-02 DIAGNOSIS — M50322 Other cervical disc degeneration at C5-C6 level: Secondary | ICD-10-CM | POA: Diagnosis not present

## 2017-09-02 MED ORDER — METHYLPREDNISOLONE 4 MG PO TBPK: ORAL_TABLET | ORAL | 0 refills | Status: DC

## 2017-09-02 MED ORDER — CYCLOBENZAPRINE HCL 10 MG PO TABS: 10.0000 mg | ORAL_TABLET | Freq: Every day | ORAL | 0 refills | Status: DC

## 2017-09-02 NOTE — Patient Instructions (Signed)
I will get an xray of your neck today and prescribe a muscle relaxer you can take at night   Please follow up in one week to see how you are doing

## 2017-09-02 NOTE — Progress Notes (Signed)
Subjective:    Patient ID: Heather Gibson, female    DOB: 11/14/1950, 67 y.o.   MRN: [id number]  HPI  67 year old female who  has a past medical history of Constipation, GERD (gastroesophageal reflux disease), Hyperlipidemia, and Hypertension. She presents to the office today for an acute issue of numbness and tingling in bilateral upper extremities. This has been present for " awhile now" but she has started to notice more often. Reports more numbness and tingling in left arm. Symptoms are present from shoulder to fingers. Symptoms are present throughout the the day intermittently. She does feel as though she has decreased grip strength bilaterally, has not dropped anything but it finding it hard to open jars.   She has a " stiff neck" as well. This has been present for " some time". Has been using a heating pad, which she finds helpful intermittently.    Review of Systems See HPI   Past Medical History:  Diagnosis Date  . Constipation   . GERD (gastroesophageal reflux disease)   . Hyperlipidemia   . Hypertension     Social History   Socioeconomic History  . Marital status: Married    Spouse name: Not on file  . Number of children: Not on file  . Years of education: Not on file  . Highest education level: Not on file  Occupational History  . Not on file  Social Needs  . Financial resource strain: Not on file  . Food insecurity:    Worry: Not on file    Inability: Not on file  . Transportation needs:    Medical: Not on file    Non-medical: Not on file  Tobacco Use  . Smoking status: Never Smoker  . Smokeless tobacco: Never Used  Substance and Sexual Activity  . Alcohol use: No  . Drug use: No  . Sexual activity: Not Currently    Birth control/protection: Post-menopausal  Lifestyle  . Physical activity:    Days per week: Not on file    Minutes per session: Not on file  . Stress: Not on file  Relationships  . Social connections:    Talks on phone: Not on  file    Gets together: Not on file    Attends religious service: Not on file    Active member of club or organization: Not on file    Attends meetings of clubs or organizations: Not on file    Relationship status: Not on file  . Intimate partner violence:    Fear of current or ex partner: Not on file    Emotionally abused: Not on file    Physically abused: Not on file    Forced sexual activity: Not on file  Other Topics Concern  . 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 ABDOMINAL HYSTERECTOMY    . COLPORRHAPHY    . PROLAPSED UTERINE FIBROID LIGATION     x2  . SALPINGOOPHORECTOMY Right     Family History  Problem Relation Age of Onset  . Breast cancer Mother   . Hypertension Mother   . Schizophrenia Mother   . Hypertension Maternal Grandmother   . Diabetes Maternal Grandmother   . Breast cancer Maternal Grandmother        ?  Allergies  Allergen Reactions  . Eggs Or Egg-Derived Products     Years ago dx- eats egg now without any problems  . Latex Swelling  . Milk-Related Compounds     Cannot have large amt of milk products  . Peanut-Containing Drug Products     Cannot have large amts of  . Shellfish Allergy Itching    Current Outpatient Medications on File Prior to Visit  Medication Sig Dispense Refill  . acetaminophen (TYLENOL) 325 MG tablet Take 650 mg by mouth every 6 (six) hours as needed.    Marland Kitchen amLODipine (NORVASC) 10 MG tablet TAKE 1 TABLET(10 MG) BY MOUTH DAILY 90 tablet 3  . lisinopril-hydrochlorothiazide (PRINZIDE,ZESTORETIC) 20-25 MG tablet Take 1 tablet by mouth daily. 90 tablet 3  . rosuvastatin (CRESTOR) 20 MG tablet Take 1 tablet (20 mg total) by mouth daily. 90 tablet 3   No current facility-administered medications on file prior to visit.     BP 124/74 (BP  Location: Left Wrist, Cuff Size: Normal)   Temp 98.1 F (36.7 C)   Wt 217 lb (98.4 kg)   BMI 36.67 kg/m       Objective:   Physical Exam  Constitutional: She is oriented to person, place, and time. She appears well-developed and well-nourished. No distress.  Cardiovascular: Normal rate, regular rhythm, normal heart sounds and intact distal pulses.  Pulmonary/Chest: Effort normal and breath sounds normal.  Musculoskeletal: Normal range of motion. She exhibits no edema, tenderness or deformity.  Stiff trapezius bilaterally. No pain with palpation   Neurological: She is alert and oriented to person, place, and time. She has normal strength and normal reflexes. She displays no tremor. No cranial nerve deficit or sensory deficit. She exhibits normal muscle tone. She displays no seizure activity.  5/5 grip strength bilaterally .  Skin: Skin is warm. She is not diaphoretic.  Psychiatric: She has a normal mood and affect. Her behavior is normal. Judgment and thought content normal.  Nursing note and vitals reviewed.     Assessment & Plan:  1. Cervical radiculopathy - Will get xray today and give a week of Flexeril and Medrol Dose pack. She will follow up next week for reevaluation. If no improvement will get MRI of cervical spine. Possible herniated disk or nerve root entrapment.  - DG Cervical Spine Complete; Future - cyclobenzaprine (FLEXERIL) 10 MG tablet; Take 1 tablet (10 mg total) by mouth at bedtime.  Dispense: 10 tablet; Refill: 0 - methylPREDNISolone (MEDROL DOSEPAK) 4 MG TBPK tablet; Take as directed  Dispense: 21 tablet; Refill: 0  Dorothyann Peng, NP

## 2017-09-03 ENCOUNTER — Telehealth: Payer: Self-pay | Admitting: Adult Health

## 2017-09-07 NOTE — Telephone Encounter (Signed)
Pt has been notified of results.  See result note.

## 2017-09-07 NOTE — Telephone Encounter (Signed)
Copied from [facility]. Topic: Quick Communication - See Telephone Encounter >> Sep 03, 2017  4:55 PM Neva Seat wrote: Pt needing Xray results from [facility] 09-02-17.  Please call pt back asap with results. CB# [id number]

## 2017-09-23 DIAGNOSIS — E669 Obesity, unspecified: Secondary | ICD-10-CM | POA: Diagnosis not present

## 2017-09-23 DIAGNOSIS — Z803 Family history of malignant neoplasm of breast: Secondary | ICD-10-CM | POA: Diagnosis not present

## 2017-09-23 DIAGNOSIS — I10 Essential (primary) hypertension: Secondary | ICD-10-CM | POA: Diagnosis not present

## 2017-09-23 DIAGNOSIS — Z825 Family history of asthma and other chronic lower respiratory diseases: Secondary | ICD-10-CM | POA: Diagnosis not present

## 2017-09-23 DIAGNOSIS — M2550 Pain in unspecified joint: Secondary | ICD-10-CM | POA: Diagnosis not present

## 2017-09-23 DIAGNOSIS — Z823 Family history of stroke: Secondary | ICD-10-CM | POA: Diagnosis not present

## 2017-09-23 DIAGNOSIS — Z8249 Family history of ischemic heart disease and other diseases of the circulatory system: Secondary | ICD-10-CM | POA: Diagnosis not present

## 2017-09-23 DIAGNOSIS — Z6834 Body mass index (BMI) 34.0-34.9, adult: Secondary | ICD-10-CM | POA: Diagnosis not present

## 2017-09-23 DIAGNOSIS — E785 Hyperlipidemia, unspecified: Secondary | ICD-10-CM | POA: Diagnosis not present

## 2017-09-23 DIAGNOSIS — Z833 Family history of diabetes mellitus: Secondary | ICD-10-CM | POA: Diagnosis not present

## 2017-10-13 ENCOUNTER — Encounter: Payer: Self-pay | Admitting: Adult Health

## 2017-10-13 ENCOUNTER — Ambulatory Visit (INDEPENDENT_AMBULATORY_CARE_PROVIDER_SITE_OTHER): Payer: Medicare HMO | Admitting: Adult Health

## 2017-10-13 VITALS — BP 136/78 | HR 79 | Temp 98.4°F | Wt 222.0 lb

## 2017-10-13 DIAGNOSIS — Z23 Encounter for immunization: Secondary | ICD-10-CM | POA: Diagnosis not present

## 2017-10-13 DIAGNOSIS — M25561 Pain in right knee: Secondary | ICD-10-CM | POA: Diagnosis not present

## 2017-10-13 MED ORDER — METHYLPREDNISOLONE ACETATE 80 MG/ML IJ SUSP
80.0000 mg | Freq: Once | INTRAMUSCULAR | Status: AC
  Administered 2017-10-13: 80 mg via INTRA_ARTICULAR

## 2017-10-13 NOTE — Progress Notes (Signed)
Subjective:    Patient ID: Ascencion Dike, female    DOB: [birth year], 67 y.o.   MRN: [id number]  HPI 67 year old female who  has a past medical history of Constipation, GERD (gastroesophageal reflux disease), Hyperlipidemia, and Hypertension.  She presents to the office today with the complaint of right knee pain. Reports pain has been constant over the last 3-4 weeks. She denies any trauma  Pain is worse at night and is felt as a " aching" pain. She has been using Tylenol and OTC sports creams without resolution. Denies any redness, warmth, or swelling. Walking exacerbates the pain.    Review of Systems See HPI   Past Medical History:  Diagnosis Date  . Constipation   . GERD (gastroesophageal reflux disease)   . Hyperlipidemia   . Hypertension     Social History   Socioeconomic History  . Marital status: Married    Spouse name: Not on file  . Number of children: Not on file  . Years of education: Not on file  . Highest education level: Not on file  Occupational History  . Not on file  Social Needs  . Financial resource strain: Not on file  . Food insecurity:    Worry: Not on file    Inability: Not on file  . Transportation needs:    Medical: Not on file    Non-medical: Not on file  Tobacco Use  . Smoking status: Never Smoker  . Smokeless tobacco: Never Used  Substance and Sexual Activity  . Alcohol use: No  . Drug use: No  . Sexual activity: Not Currently    Birth control/protection: Post-menopausal  Lifestyle  . Physical activity:    Days per week: Not on file    Minutes per session: Not on file  . Stress: Not on file  Relationships  . Social connections:    Talks on phone: Not on file    Gets together: Not on file    Attends religious service: Not on file    Active member of club or organization: Not on file    Attends meetings of clubs or organizations: Not on file    Relationship status: Not on file  . Intimate partner violence:    Fear of  current or ex partner: Not on file    Emotionally abused: Not on file    Physically abused: Not on file    Forced sexual activity: Not on file  Other Topics Concern  . 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 ABDOMINAL HYSTERECTOMY    . COLPORRHAPHY    . PROLAPSED UTERINE FIBROID LIGATION     x2  . SALPINGOOPHORECTOMY Right     Family History  Problem Relation Age of Onset  . Breast cancer Mother   . Hypertension Mother   . Schizophrenia Mother   . Hypertension Maternal Grandmother   . Diabetes Maternal Grandmother   . Breast cancer Maternal Grandmother        ?    Allergies  Allergen Reactions  . Eggs Or Egg-Derived Products     Years ago dx- eats egg now without any problems  . Latex Swelling  . Milk-Related Compounds     Cannot have large amt of milk products  .  Peanut-Containing Drug Products     Cannot have large amts of  . Shellfish Allergy Itching    Current Outpatient Medications on File Prior to Visit  Medication Sig Dispense Refill  . acetaminophen (TYLENOL) 325 MG tablet Take 650 mg by mouth every 6 (six) hours as needed.    Marland Kitchen amLODipine (NORVASC) 10 MG tablet TAKE 1 TABLET(10 MG) BY MOUTH DAILY 90 tablet 3  . lisinopril-hydrochlorothiazide (PRINZIDE,ZESTORETIC) 20-25 MG tablet Take 1 tablet by mouth daily. 90 tablet 3  . rosuvastatin (CRESTOR) 20 MG tablet Take 1 tablet (20 mg total) by mouth daily. 90 tablet 3  . cyclobenzaprine (FLEXERIL) 10 MG tablet Take 1 tablet (10 mg total) by mouth at bedtime. (Patient not taking: Reported on 10/13/2017) 10 tablet 0   No current facility-administered medications on file prior to visit.     BP 136/78 (BP Location: Left Arm, Patient Position: Sitting, Cuff Size: Large)   Pulse 79   Temp 98.4 F (36.9 C) (Oral)   Wt 222 lb (100.7 kg)    SpO2 97%   BMI 37.52 kg/m       Objective:   Physical Exam  Constitutional: She is oriented to person, place, and time. She appears well-developed and well-nourished. No distress.  Cardiovascular: Normal rate, regular rhythm, normal heart sounds and intact distal pulses.  Pulmonary/Chest: Effort normal and breath sounds normal.  Musculoskeletal: She exhibits tenderness. She exhibits no edema or deformity.       Right knee: She exhibits bony tenderness. She exhibits normal range of motion, no swelling, no effusion, normal alignment, no LCL laxity, normal patellar mobility, normal meniscus and no MCL laxity. No tenderness found. No medial joint line, no lateral joint line, no MCL, no LCL and no patellar tendon tenderness noted.  Negative posterior and anterior draw test    Neurological: She is alert and oriented to person, place, and time.  Skin: Skin is warm and dry. She is not diaphoretic.  Nursing note and vitals reviewed.     Assessment & Plan:  1. Acute pain of right knee - We spoke about likely causes. Most likely arthritic pain in this case. We also talked about medication therapy vs joint injection. She responded well to shoulder injection in the past and would like to try this again  Discussed risks and benefits of corticosteroid injection and patient consented.  After prepping skin with betadine, injected 80 mg depomedrol and 2 cc of plain xylocaine with 22 gauge one and one half inch needle using anterolateral approach and pt tolerated well. - methylPREDNISolone acetate (DEPO-MEDROL) injection 80 mg - Advised follow up if not resolved   2. Need for influenza vaccination  - Flu vaccine HIGH DOSE PF (Fluzone High dose)  Dorothyann Peng, NP

## 2017-10-22 ENCOUNTER — Telehealth: Payer: Self-pay | Admitting: Adult Health

## 2017-10-26 NOTE — Telephone Encounter (Signed)
Sent to the pharmacy by e-scribe. 

## 2017-11-05 ENCOUNTER — Other Ambulatory Visit: Payer: Self-pay | Admitting: Adult Health

## 2017-11-05 NOTE — Telephone Encounter (Signed)
Denied.  Filled on 10/26/17 

## 2017-11-08 ENCOUNTER — Ambulatory Visit (INDEPENDENT_AMBULATORY_CARE_PROVIDER_SITE_OTHER): Payer: Medicare HMO

## 2017-11-08 ENCOUNTER — Encounter: Payer: Self-pay | Admitting: Family Medicine

## 2017-11-08 ENCOUNTER — Ambulatory Visit (INDEPENDENT_AMBULATORY_CARE_PROVIDER_SITE_OTHER): Payer: Medicare HMO | Admitting: Family Medicine

## 2017-11-08 VITALS — BP 118/70 | HR 80 | Temp 97.8°F | Wt 214.0 lb

## 2017-11-08 DIAGNOSIS — R339 Retention of urine, unspecified: Secondary | ICD-10-CM

## 2017-11-08 DIAGNOSIS — M25561 Pain in right knee: Secondary | ICD-10-CM

## 2017-11-08 DIAGNOSIS — G8929 Other chronic pain: Secondary | ICD-10-CM | POA: Diagnosis not present

## 2017-11-08 DIAGNOSIS — R011 Cardiac murmur, unspecified: Secondary | ICD-10-CM | POA: Diagnosis not present

## 2017-11-08 DIAGNOSIS — N3001 Acute cystitis with hematuria: Secondary | ICD-10-CM

## 2017-11-08 LAB — POC URINALSYSI DIPSTICK (AUTOMATED)
BILIRUBIN UA: NEGATIVE
GLUCOSE UA: NEGATIVE
KETONES UA: NEGATIVE
Protein, UA: POSITIVE — AB
SPEC GRAV UA: 1.015 (ref 1.010–1.025)
Urobilinogen, UA: 0.2 E.U./dL
pH, UA: 7 (ref 5.0–8.0)

## 2017-11-08 MED ORDER — AMOXICILLIN-POT CLAVULANATE 500-125 MG PO TABS: 1.0000 | ORAL_TABLET | Freq: Two times a day (BID) | ORAL | 0 refills | Status: AC

## 2017-11-08 NOTE — Patient Instructions (Signed)
Urinary Tract Infection, Adult A urinary tract infection (UTI) is an infection of any part of the urinary tract. The urinary tract includes the:  Kidneys.  Ureters.  Bladder.  Urethra.  These organs make, store, and get rid of pee (urine) in the body. Follow these instructions at home:  Take over-the-counter and prescription medicines only as told by your doctor.  If you were prescribed an antibiotic medicine, take it as told by your doctor. Do not stop taking the antibiotic even if you start to feel better.  Avoid the following drinks: ? Alcohol. ? Caffeine. ? Tea. ? Carbonated drinks.  Drink enough fluid to keep your pee clear or pale yellow.  Keep all follow-up visits as told by your doctor. This is important.  Make sure to: ? Empty your bladder often and completely. Do not to hold pee for long periods of time. ? Empty your bladder before and after sex. ? Wipe from front to back after a bowel movement if you are female. Use each tissue one time when you wipe. Contact a doctor if:  You have back pain.  You have a fever.  You feel sick to your stomach (nauseous).  You throw up (vomit).  Your symptoms do not get better after 3 days.  Your symptoms go away and then come back. Get help right away if:  You have very bad back pain.  You have very bad lower belly (abdominal) pain.  You are throwing up and cannot keep down any medicines or water. This information is not intended to replace advice given to you by your health care provider. Make sure you discuss any questions you have with your health care provider. Document Released: 06/10/2007 Document Revised: 05/30/2015 Document Reviewed: 11/12/2014 Elsevier Interactive Patient Education  2018 [employer].  Knee Pain, Adult Many things can cause knee pain. The pain often goes away on its own with time and rest. If the pain does not go away, tests may be done to find out what is causing the pain. Follow these  instructions at home: Activity  Rest your knee.  Do not do things that cause pain.  Avoid activities where both feet leave the ground at the same time (high-impact activities). Examples are running, jumping rope, and doing jumping jacks. General instructions  Take medicines only as told by your doctor.  Raise (elevate) your knee when you are resting. Make sure your knee is higher than your heart.  Sleep with a pillow under your knee.  If told, put ice on the knee: ? Put ice in a plastic bag. ? Place a towel between your skin and the bag. ? Leave the ice on for 20 minutes, 2-3 times a day.  Ask your doctor if you should wear an elastic knee support.  Lose weight if you are overweight. Being overweight can make your knee hurt more.  Do not use any tobacco products. These include cigarettes, chewing tobacco, or electronic cigarettes. If you need help quitting, ask your doctor. Smoking may slow down healing. Contact a doctor if:  The pain does not stop.  The pain changes or gets worse.  You have a fever along with knee pain.  Your knee gives out or locks up.  Your knee swells, and becomes worse. Get help right away if:  Your knee feels warm.  You cannot move your knee.  You have very bad knee pain.  You have chest pain.  You have trouble breathing. Summary  Many things can cause  knee pain. The pain often goes away on its own with time and rest.  Avoid activities that put stress on your knee. These include running and jumping rope.  Get help right away if you cannot move your knee, or if your knee feels warm, or if you have trouble breathing. This information is not intended to replace advice given to you by your health care provider. Make sure you discuss any questions you have with your health care provider. Document Released: 03/20/2008 Document Revised: 12/17/2015 Document Reviewed: 12/17/2015 Elsevier Interactive Patient Education  2017 [employer].

## 2017-11-08 NOTE — Progress Notes (Signed)
Subjective:    Patient ID: Heather Gibson, female    DOB: [birth year], 67 y.o.   MRN: [id number]  No chief complaint on file.   HPI Patient was seen today for several ongoing concerns.  Pt typically seen by Dorothyann Peng, NP.  Pt endorses nausea x5 days, decreased appetite, loose stools.  Pt also noted chills, subjective fever, nonproductive cough, itchy eyes, rhinorrhea which seems to be resolving.  Sick contacts include pt's daughter who was not feeling well.  Pt tried omeprazole for her symptoms.  Pt also endorses continued right knee pain.  Pt is s/p an joint injection with Depo-Medrol 80 mg on 10/13/2017.  Pt states the injection did not give her any relief.  Pt tried Norel AD (acetaminophen 325 mg, chlorpheneramine 4 mg, phenylephrine 10 mg) for her pain with no relief at the recommendation of her friend.  Past Medical History:  Diagnosis Date  . Constipation   . GERD (gastroesophageal reflux disease)   . Hyperlipidemia   . Hypertension     Allergies  Allergen Reactions  . Eggs Or Egg-Derived Products     Years ago dx- eats egg now without any problems  . Latex Swelling  . Milk-Related Compounds     Cannot have large amt of milk products  . Peanut-Containing Drug Products     Cannot have large amts of  . Shellfish Allergy Itching    ROS General: Denies night sweats, changes in weight   + fever, chills, changes in appetite HEENT: Denies headaches, ear pain, changes in vision, sore throat  + rhinorrhea, itchy eyes CV: Denies CP, palpitations, SOB, orthopnea Pulm: Denies SOB, wheezing + cough GI: Denies abdominal pain, nausea, vomiting, constipation  + loose stools GU: Denies dysuria, hematuria, frequency, vaginal discharge Msk: Denies muscle cramps  +R knee pain Neuro: Denies weakness, numbness, tingling Skin: Denies rashes, bruising Psych: Denies depression, anxiety, hallucinations    Objective:    Blood pressure 118/70, pulse 80, temperature 97.8 F (36.6 C),  temperature source Oral, weight 214 lb (97.1 kg), SpO2 96 %.   Gen. Pleasant, well-nourished, in no distress, normal affect   HEENT: [place]/AT, face symmetric, no scleral icterus, PERRLA, nares patent without drainage, pharynx without erythema or exudate, TMs normal b/l. Lungs: no accessory muscle use, CTAB, no wheezes or rales Cardiovascular: RRR, 2/6 murmur best heard in R upper chest mid clavicular line, noperipheral edema  Abdomen: BS present, soft, NT/ND Musculoskeletal: No deformities, no cyanosis or clubbing, normal tone Neuro:  A&Ox3, CN II-XII intact, normal gait  Wt Readings from Last 3 Encounters:  11/08/17 214 lb (97.1 kg)  10/13/17 222 lb (100.7 kg)  09/02/17 217 lb (98.4 kg)    Lab Results  Component Value Date   WBC 7.7 06/16/2017   HGB 13.8 06/16/2017   HCT 41.2 06/16/2017   PLT 393.0 06/16/2017   GLUCOSE 100 (H) 06/16/2017   CHOL 234 (H) 06/16/2017   TRIG 87.0 06/16/2017   HDL 54.00 06/16/2017   LDLCALC 163 (H) 06/16/2017   ALT 14 06/16/2017   AST 17 06/16/2017   NA 141 06/16/2017   K 4.2 06/16/2017   CL 102 06/16/2017   CREATININE 0.72 06/16/2017   BUN 18 06/16/2017   CO2 30 06/16/2017   TSH 0.53 06/16/2017    Assessment/Plan:  Acute cystitis with hematuria  -UA with SG 1.015, 1+ blood, 3+protein, + nitrates, 2+ leuks - Plan: Urine Culture, amoxicillin-clavulanate (AUGMENTIN) 500-125 MG tablet  Urinary retention  -initial difficulty obtaining sample.  Pt given water. - Plan: POCT Urinalysis Dipstick (Automated)  Chronic pain of right knee - Plan: DG Knee Complete 4 Views Right  Heart murmur -continue to monitor, consider ECHO  Follow-up PRN with PCP  Grier Mitts, MD

## 2017-11-11 LAB — URINE CULTURE
MICRO NUMBER:: 91324165
SPECIMEN QUALITY:: ADEQUATE

## 2017-11-17 ENCOUNTER — Other Ambulatory Visit: Payer: Self-pay | Admitting: Adult Health

## 2017-11-17 NOTE — Telephone Encounter (Signed)
Denied.  Filled on 10/26/17

## 2017-11-22 ENCOUNTER — Other Ambulatory Visit: Payer: Self-pay | Admitting: *Deleted

## 2017-11-22 MED ORDER — ROSUVASTATIN CALCIUM 20 MG PO TABS: ORAL_TABLET | ORAL | 2 refills | Status: DC

## 2017-11-22 NOTE — Telephone Encounter (Signed)
Pt called stating pharmacy is requesting medication refill and office has denied twice. Advised pt RX was sent 10/22 for rosuvastatin. I called [facility] and was informed by the pharmacist they did not receive electronic RX 10/22 and asked if it can be resent. Advised pt I would mark resend request as high priority.  [place], [place] [address] [place] [phone] (Phone) [phone] (Fax)

## 2017-11-22 NOTE — Telephone Encounter (Signed)
Rx resent to pharmacy- verified with pharmacy that they did receive the transmission. Call to patient- let her know that he Rx is at the pharmacy.

## 2018-03-22 ENCOUNTER — Encounter: Payer: Self-pay | Admitting: Internal Medicine

## 2018-06-14 ENCOUNTER — Other Ambulatory Visit: Payer: Self-pay | Admitting: Adult Health

## 2018-06-15 NOTE — Telephone Encounter (Signed)
FILLED FOR 9 MONTHS 11/2017.  REQUEST IS EARLY.

## 2018-06-16 ENCOUNTER — Encounter: Payer: Medicare HMO | Admitting: Internal Medicine

## 2018-07-21 ENCOUNTER — Other Ambulatory Visit: Payer: Self-pay | Admitting: Adult Health

## 2018-07-21 DIAGNOSIS — I10 Essential (primary) hypertension: Secondary | ICD-10-CM

## 2018-07-21 NOTE — Telephone Encounter (Signed)
Left a message for a return call.

## 2018-07-22 NOTE — Telephone Encounter (Signed)
Pt calling back. Please call back.  °

## 2018-07-27 ENCOUNTER — Encounter: Payer: Self-pay | Admitting: Family Medicine

## 2018-07-27 NOTE — Telephone Encounter (Signed)
Sent to the pharmacy by e-scribe for 90 days.  Pt due for cpx.  Letter mailed.

## 2018-09-19 ENCOUNTER — Other Ambulatory Visit: Payer: Self-pay

## 2018-09-19 ENCOUNTER — Telehealth (INDEPENDENT_AMBULATORY_CARE_PROVIDER_SITE_OTHER): Payer: Medicare HMO | Admitting: Family Medicine

## 2018-09-19 DIAGNOSIS — R059 Cough, unspecified: Secondary | ICD-10-CM

## 2018-09-19 DIAGNOSIS — R05 Cough: Secondary | ICD-10-CM

## 2018-09-19 DIAGNOSIS — E782 Mixed hyperlipidemia: Secondary | ICD-10-CM

## 2018-09-19 DIAGNOSIS — K219 Gastro-esophageal reflux disease without esophagitis: Secondary | ICD-10-CM

## 2018-09-19 DIAGNOSIS — M1990 Unspecified osteoarthritis, unspecified site: Secondary | ICD-10-CM | POA: Diagnosis not present

## 2018-09-19 MED ORDER — DICLOFENAC SODIUM 1 % TD GEL: 2.0000 g | Freq: Four times a day (QID) | TRANSDERMAL | 0 refills | Status: DC | PRN

## 2018-09-19 MED ORDER — OMEPRAZOLE 20 MG PO CPDR: 20.0000 mg | DELAYED_RELEASE_CAPSULE | Freq: Every day | ORAL | 3 refills | Status: DC

## 2018-09-19 NOTE — Progress Notes (Signed)
Virtual Visit via Video Note  I connected with [name] on 09/19/18 at  3:30 PM EDT by a video enabled telemedicine application 2/2 [id number] pandemic and verified that I am speaking with the correct person using two identifiers.  Location patient: home Location provider:work or home office Persons participating in the virtual visit: patient, provider  I discussed the limitations of evaluation and management by telemedicine and the availability of in person appointments. The patient expressed understanding and agreed to proceed.   HPI: Pt with heartburn x 1 mo.  Pt also notes increased dry cough that is better today.  Pt has been taking rolaids, tums, famotidine.  Pt eating a lot of fried fish, 4-5 x wk.  Pt also using turmeric as seasoning on her food.  May got to bed shortly after eating dinner.  On crestor 20 mg QOD.  Dry cough noted as dry, strainy, choking feeling.  Denies other symptoms. Pt taking lisinopril 20 mg daily, HCTZ 25mg , and norvasc 10 mg for HTN.    Pt with R knee ache and edema.  Pt wearing a knee brace with copper in it.  Taking tylenol or Aleeve.  Tries not to use the Aleeve as it causes heartburn.  Pt works in a group home.  ROS: See pertinent positives and negatives per HPI.  Past Medical History:  Diagnosis Date  . Constipation   . GERD (gastroesophageal reflux disease)   . Hyperlipidemia   . Hypertension     Past Surgical History:  Procedure Laterality Date  . ABDOMINAL HYSTERECTOMY    . COLPORRHAPHY    . PROLAPSED UTERINE FIBROID LIGATION     x2  . SALPINGOOPHORECTOMY Right     Family History  Problem Relation Age of Onset  . Breast cancer Mother   . Hypertension Mother   . Schizophrenia Mother   . Hypertension Maternal Grandmother   . Diabetes Maternal Grandmother   . Breast cancer Maternal Grandmother        ?     Current Outpatient Medications:  .  acetaminophen (TYLENOL) 325 MG tablet, Take 650 mg by mouth every 6 (six) hours as  needed., Disp: , Rfl:  .  amLODipine (NORVASC) 10 MG tablet, TAKE 1 TABLET EVERY DAY, Disp: 90 tablet, Rfl: 0 .  cyclobenzaprine (FLEXERIL) 10 MG tablet, Take 1 tablet (10 mg total) by mouth at bedtime., Disp: 10 tablet, Rfl: 0 .  lisinopril-hydrochlorothiazide (ZESTORETIC) 20-25 MG tablet, TAKE 1 TABLET EVERY DAY, Disp: 90 tablet, Rfl: 0 .  rosuvastatin (CRESTOR) 20 MG tablet, TAKE 1 TABLET EVERY DAY, Disp: 90 tablet, Rfl: 0  EXAM:  VITALS per patient if applicable:  GENERAL: alert, oriented, appears well and in no acute distress  HEENT: atraumatic, conjunctiva clear, no obvious abnormalities on inspection of external nose and ears  NECK: normal movements of the head and neck  LUNGS: on inspection no signs of respiratory distress, breathing rate appears normal, no obvious gross SOB, gasping or wheezing  CV: no obvious cyanosis  MS: moves all visible extremities without noticeable abnormality  PSYCH/NEURO: pleasant and cooperative, no obvious depression or anxiety, speech and thought processing grossly intact  ASSESSMENT AND PLAN:  Discussed the following assessment and plan:  Gastroesophageal reflux disease, esophagitis presence not specified -likely 2/2 diet,  Eating fried foods 4-5 x/ wk -discussed lifestyle modifications -pt to keep a food diary -Plan: Omeprazole 20 mg  Cough -likely 2/2 GERD vs lisinopril  -will start omeprazole. -if cough continues discussed f/u appt to d/c  lisinopril  Arthritis -R knee -Xray with mild tricompartmental degenerative changes on 11/08/17 -Discussed Voltaren gel prn.  Advised OTC, but pt requested rx  HLD, mixed -continue crestor 20 mg QOD -lifestyle modifications encouraged  F/u prn with pcp in the next few wks.    I discussed the assessment and treatment plan with the patient. The patient was provided an opportunity to ask questions and all were answered. The patient agreed with the plan and demonstrated an understanding of the  instructions.   The patient was advised to call back or seek an in-person evaluation if the symptoms worsen or if the condition fails to improve as anticipated.   Billie Ruddy, MD

## 2018-09-23 ENCOUNTER — Other Ambulatory Visit: Payer: Self-pay | Admitting: Adult Health

## 2018-09-23 DIAGNOSIS — I10 Essential (primary) hypertension: Secondary | ICD-10-CM

## 2018-09-27 NOTE — Telephone Encounter (Signed)
Denied.  Due for yearly cpx and fasting lab work.

## 2018-10-18 ENCOUNTER — Telehealth (INDEPENDENT_AMBULATORY_CARE_PROVIDER_SITE_OTHER): Payer: Medicare HMO | Admitting: Adult Health

## 2018-10-18 ENCOUNTER — Other Ambulatory Visit: Payer: Self-pay

## 2018-10-18 ENCOUNTER — Encounter: Payer: Medicare HMO | Admitting: Adult Health

## 2018-10-18 DIAGNOSIS — M1990 Unspecified osteoarthritis, unspecified site: Secondary | ICD-10-CM | POA: Diagnosis not present

## 2018-10-18 DIAGNOSIS — I10 Essential (primary) hypertension: Secondary | ICD-10-CM | POA: Diagnosis not present

## 2018-10-18 DIAGNOSIS — J029 Acute pharyngitis, unspecified: Secondary | ICD-10-CM | POA: Diagnosis not present

## 2018-10-18 DIAGNOSIS — J302 Other seasonal allergic rhinitis: Secondary | ICD-10-CM

## 2018-10-18 MED ORDER — MELOXICAM 7.5 MG PO TABS: 7.5000 mg | ORAL_TABLET | Freq: Every day | ORAL | 1 refills | Status: DC

## 2018-10-18 MED ORDER — ROSUVASTATIN CALCIUM 20 MG PO TABS: ORAL_TABLET | ORAL | 0 refills | Status: DC

## 2018-10-18 MED ORDER — DICLOFENAC SODIUM 1 % TD GEL: 2.0000 g | Freq: Four times a day (QID) | TRANSDERMAL | 3 refills | Status: AC | PRN

## 2018-10-18 MED ORDER — LISINOPRIL-HYDROCHLOROTHIAZIDE 20-25 MG PO TABS: 1.0000 | ORAL_TABLET | Freq: Every day | ORAL | 0 refills | Status: DC

## 2018-10-18 MED ORDER — AMLODIPINE BESYLATE 10 MG PO TABS: ORAL_TABLET | ORAL | 0 refills | Status: DC

## 2018-10-18 NOTE — Progress Notes (Deleted)
Subjective:    Patient ID: Heather Gibson, female    DOB: 02-10-1950, 68 y.o.   MRN: [id number]  HPI  Patient presents for yearly preventative medicine examination. She is a pleasant 68 year old female who  has a past medical history of Constipation, GERD (gastroesophageal reflux disease), Hyperlipidemia, and Hypertension.  Essential Hypertension - Controlled with Norvasc 10 mg and Lisinopril/HCTZ 20-25 mg.  BP Readings from Last 3 Encounters:  11/08/17 118/70  10/13/17 136/78  09/02/17 124/74   Hyperlipidemia - Currently prescribed Crestor 20 mg  Lab Results  Component Value Date   CHOL 234 (H) 06/16/2017   HDL 54.00 06/16/2017   LDLCALC 163 (H) 06/16/2017   TRIG 87.0 06/16/2017   CHOLHDL 4 06/16/2017    All immunizations and health maintenance protocols were reviewed with the patient and needed orders were placed.  She is due for Pneumovax 23 and yearly influenza vaccination  Appropriate screening laboratory values were ordered for the patient including screening of hyperlipidemia, renal function and hepatic function.  Medication reconciliation,  past medical history, social history, problem list and allergies were reviewed in detail with the patient  Goals were established with regard to weight loss, exercise, and  diet in compliance with medications  She is due for colonoscopy.  She is up-to-date on routine screening mammogram   Review of Systems  Constitutional: Negative.   HENT: Negative.   Eyes: Negative.   Respiratory: Negative.   Cardiovascular: Negative.   Gastrointestinal: Negative.   Endocrine: Negative.   Genitourinary: Negative.   Musculoskeletal: Negative.   Skin: Negative.   Allergic/Immunologic: Negative.   Neurological: Negative.   Hematological: Negative.   Psychiatric/Behavioral: Negative.    Past Medical History:  Diagnosis Date  . Constipation   . GERD (gastroesophageal reflux disease)   . Hyperlipidemia   . Hypertension      Social History   Socioeconomic History  . Marital status: Married    Spouse name: Not on file  . Number of children: Not on file  . Years of education: Not on file  . Highest education level: Not on file  Occupational History  . Not on file  Social Needs  . Financial resource strain: Not on file  . Food insecurity    Worry: Not on file    Inability: Not on file  . Transportation needs    Medical: Not on file    Non-medical: Not on file  Tobacco Use  . Smoking status: Never Smoker  . Smokeless tobacco: Never Used  Substance and Sexual Activity  . Alcohol use: No  . Drug use: No  . Sexual activity: Not Currently    Birth control/protection: Post-menopausal  Lifestyle  . Physical activity    Days per week: Not on file    Minutes per session: Not on file  . Stress: Not on file  Relationships  . Social Herbalist on phone: Not on file    Gets together: Not on file    Attends religious service: Not on file    Active member of club or organization: Not on file    Attends meetings of clubs or organizations: Not on file    Relationship status: Not on file  . Intimate partner violence    Fear of current or ex partner: Not on file    Emotionally abused: Not on file    Physically abused: Not on file    Forced sexual activity: Not on file  Other Topics Concern  .  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 ABDOMINAL HYSTERECTOMY    . COLPORRHAPHY    . PROLAPSED UTERINE FIBROID LIGATION     x2  . SALPINGOOPHORECTOMY Right     Family History  Problem Relation Age of Onset  . Breast cancer Mother   . Hypertension Mother   . Schizophrenia Mother   . Hypertension Maternal Grandmother   . Diabetes Maternal Grandmother   . Breast cancer Maternal Grandmother        ?    Allergies   Allergen Reactions  . Eggs Or Egg-Derived Products     Years ago dx- eats egg now without any problems  . Latex Swelling  . Milk-Related Compounds     Cannot have large amt of milk products  . Peanut-Containing Drug Products     Cannot have large amts of  . Shellfish Allergy Itching    Current Outpatient Medications on File Prior to Visit  Medication Sig Dispense Refill  . acetaminophen (TYLENOL) 325 MG tablet Take 650 mg by mouth every 6 (six) hours as needed.    Marland Kitchen amLODipine (NORVASC) 10 MG tablet TAKE 1 TABLET EVERY DAY 90 tablet 0  . diclofenac sodium (VOLTAREN) 1 % GEL Apply 2 g topically 4 (four) times daily as needed. 100 g 0  . lisinopril-hydrochlorothiazide (ZESTORETIC) 20-25 MG tablet TAKE 1 TABLET EVERY DAY 90 tablet 0  . omeprazole (PRILOSEC) 20 MG capsule Take 1 capsule (20 mg total) by mouth daily. 30 capsule 3  . rosuvastatin (CRESTOR) 20 MG tablet TAKE 1 TABLET EVERY DAY 90 tablet 0   No current facility-administered medications on file prior to visit.     There were no vitals taken for this visit.      Objective:   Physical Exam Vitals signs and nursing note reviewed.  Constitutional:      General: She is not in acute distress.    Appearance: Normal appearance. She is not diaphoretic.  HENT:     Head: Normocephalic and atraumatic.     Right Ear: Tympanic membrane, ear canal and external ear normal. There is no impacted cerumen.     Left Ear: Tympanic membrane, ear canal and external ear normal. There is no impacted cerumen.     Nose: Nose normal. No congestion or rhinorrhea.     Mouth/Throat:     Mouth: Mucous membranes are moist.     Pharynx: Oropharynx is clear. No oropharyngeal exudate.  Eyes:     General: No scleral icterus.       Right eye: No discharge.        Left eye: No discharge.     Conjunctiva/sclera: Conjunctivae normal.     Pupils: Pupils are equal, round, and reactive to light.  Neck:     Musculoskeletal: Normal range of motion and neck  supple.     Thyroid: No thyromegaly.     Vascular: No JVD.     Trachea: No tracheal deviation.  Cardiovascular:     Rate and Rhythm: Normal rate and regular rhythm.     Pulses: Normal pulses.     Heart sounds: Normal heart sounds. No murmur. No friction rub. No gallop.   Pulmonary:     Effort: Pulmonary effort is normal. No respiratory distress.  Breath sounds: Normal breath sounds. No stridor. No wheezing, rhonchi or rales.  Chest:     Chest wall: No tenderness.  Abdominal:     General: Bowel sounds are normal. There is no distension.     Palpations: Abdomen is soft. There is no mass.     Tenderness: There is no abdominal tenderness. There is no right CVA tenderness, left CVA tenderness, guarding or rebound.     Hernia: No hernia is present.  Musculoskeletal: Normal range of motion.        General: No swelling, tenderness, deformity or signs of injury.     Right lower leg: No edema.     Left lower leg: No edema.  Lymphadenopathy:     Cervical: No cervical adenopathy.  Skin:    General: Skin is warm and dry.     Coloration: Skin is not jaundiced or pale.     Findings: No bruising, erythema, lesion or rash.  Neurological:     General: No focal deficit present.     Mental Status: She is alert and oriented to person, place, and time. Mental status is at baseline.     Cranial Nerves: No cranial nerve deficit.     Sensory: No sensory deficit.     Motor: No weakness or abnormal muscle tone.     Coordination: Coordination normal.     Gait: Gait normal.     Deep Tendon Reflexes: Reflexes normal.  Psychiatric:        Mood and Affect: Mood normal.        Behavior: Behavior normal.        Thought Content: Thought content normal.        Judgment: Judgment normal.       Assessment & Plan:

## 2018-10-18 NOTE — Progress Notes (Signed)
Virtual Visit via Video Note  I connected with Heather Gibson on 10/18/18 at  4:30 PM EDT by a video enabled telemedicine application and verified that I am speaking with the correct person using two identifiers.  Location patient: home Location provider:work or home office Persons participating in the virtual visit: patient, provider  I discussed the limitations of evaluation and management by telemedicine and the availability of in person appointments. The patient expressed understanding and agreed to proceed.   HPI: 68 year old female is being evaluated today for sore throat.  She was supposed to have her physical this morning but due to having a sore throat she was advised not to come into the office.  Today she reports that over the last week she has been experiencing a sore throat when she wakes up in the morning.  As the day goes on her sore throat resolves.  She does feel as though she has been having more allergy symptoms and sinus congestion as of lately.  She denies fevers, chills, shortness of breath, chest pain, loss of taste or smell, or difficulty swallowing.  He is using over-the-counter allergy medicine such as Claritin without significant improvement.  She denies exposure to Covid  She is also complaining of worsening arthritic pain especially in her lower back and knees. In the past she has used Tylenol and Motrin but feels as though this is not helping any longer.  In September she was seen by another provider via telemedicine visit and prescribed Voltaren gel.  She reports that this works pretty well for a short period of time.  She is wondering if there is anything else that she can take to help with her arthritic pain.  She also needs her chronic meds filled.    ROS: See pertinent positives and negatives per HPI.  Past Medical History:  Diagnosis Date  . Constipation   . GERD (gastroesophageal reflux disease)   . Hyperlipidemia   . Hypertension     Past Surgical  History:  Procedure Laterality Date  . ABDOMINAL HYSTERECTOMY    . COLPORRHAPHY    . PROLAPSED UTERINE FIBROID LIGATION     x2  . SALPINGOOPHORECTOMY Right     Family History  Problem Relation Age of Onset  . Breast cancer Mother   . Hypertension Mother   . Schizophrenia Mother   . Hypertension Maternal Grandmother   . Diabetes Maternal Grandmother   . Breast cancer Maternal Grandmother        ?      Current Outpatient Medications:  .  acetaminophen (TYLENOL) 325 MG tablet, Take 650 mg by mouth every 6 (six) hours as needed., Disp: , Rfl:  .  amLODipine (NORVASC) 10 MG tablet, TAKE 1 TABLET EVERY DAY, Disp: 90 tablet, Rfl: 0 .  diclofenac sodium (VOLTAREN) 1 % GEL, Apply 2 g topically 4 (four) times daily as needed., Disp: 100 g, Rfl: 0 .  lisinopril-hydrochlorothiazide (ZESTORETIC) 20-25 MG tablet, TAKE 1 TABLET EVERY DAY, Disp: 90 tablet, Rfl: 0 .  omeprazole (PRILOSEC) 20 MG capsule, Take 1 capsule (20 mg total) by mouth daily., Disp: 30 capsule, Rfl: 3 .  rosuvastatin (CRESTOR) 20 MG tablet, TAKE 1 TABLET EVERY DAY, Disp: 90 tablet, Rfl: 0  EXAM:  VITALS per patient if applicable:  GENERAL: alert, oriented, appears well and in no acute distress  HEENT: atraumatic, conjunttiva clear, no obvious abnormalities on inspection of external nose and ears  NECK: normal movements of the head and neck  LUNGS: on inspection  no signs of respiratory distress, breathing rate appears normal, no obvious gross SOB, gasping or wheezing  CV: no obvious cyanosis  MS: moves all visible extremities without noticeable abnormality  PSYCH/NEURO: pleasant and cooperative, no obvious depression or anxiety, speech and thought processing grossly intact  ASSESSMENT AND PLAN:  1. Seasonal allergies -Add Flonase to her regimen  2. Sore throat - Likely from seasonal allergies or mouth breathing when she sleeps.  Advised Flonase and/or Biotene mouthwash  3. Arthritis - Will trial her on low  dose Mobic  - meloxicam (MOBIC) 7.5 MG tablet; Take 1 tablet (7.5 mg total) by mouth daily.  Dispense: 30 tablet; Refill: 1 - diclofenac sodium (VOLTAREN) 1 % GEL; Apply 2 g topically 4 (four) times daily as needed.  Dispense: 100 g; Refill: 3  4. Essential hypertension  - amLODipine (NORVASC) 10 MG tablet; TAKE 1 TABLET EVERY DAY  Dispense: 90 tablet; Refill: 0 - lisinopril-hydrochlorothiazide (ZESTORETIC) 20-25 MG tablet; Take 1 tablet by mouth daily.  Dispense: 90 tablet; Refill: 0       I discussed the assessment and treatment plan with the patient. The patient was provided an opportunity to ask questions and all were answered. The patient agreed with the plan and demonstrated an understanding of the instructions.   The patient was advised to call back or seek an in-person evaluation if the symptoms worsen or if the condition fails to improve as anticipated.   Dorothyann Peng, NP

## 2018-12-14 ENCOUNTER — Other Ambulatory Visit: Payer: Self-pay | Admitting: Adult Health

## 2018-12-14 DIAGNOSIS — M1990 Unspecified osteoarthritis, unspecified site: Secondary | ICD-10-CM

## 2018-12-16 ENCOUNTER — Other Ambulatory Visit: Payer: Self-pay | Admitting: Adult Health

## 2018-12-16 DIAGNOSIS — I10 Essential (primary) hypertension: Secondary | ICD-10-CM

## 2018-12-20 NOTE — Telephone Encounter (Signed)
Ok to refill until then

## 2018-12-20 NOTE — Telephone Encounter (Signed)
Pt scheduled in March for CPX.  Ok to fill until then?

## 2018-12-20 NOTE — Telephone Encounter (Signed)
Sent to the pharmacy by e-scribe for 90 days. 

## 2018-12-25 ENCOUNTER — Other Ambulatory Visit: Payer: Self-pay | Admitting: Family Medicine

## 2018-12-25 DIAGNOSIS — K219 Gastro-esophageal reflux disease without esophagitis: Secondary | ICD-10-CM

## 2018-12-28 ENCOUNTER — Other Ambulatory Visit: Payer: Self-pay

## 2018-12-28 ENCOUNTER — Ambulatory Visit: Payer: Medicare HMO

## 2018-12-28 ENCOUNTER — Ambulatory Visit (INDEPENDENT_AMBULATORY_CARE_PROVIDER_SITE_OTHER): Payer: Medicare HMO

## 2018-12-28 DIAGNOSIS — Z23 Encounter for immunization: Secondary | ICD-10-CM | POA: Diagnosis not present

## 2019-02-15 ENCOUNTER — Telehealth: Payer: Self-pay | Admitting: Adult Health

## 2019-02-15 NOTE — Telephone Encounter (Signed)
Based on pt hx, is it okay for pt to received Covid-19 vaccination?

## 2019-02-15 NOTE — Telephone Encounter (Signed)
Patient notified of update  and verbalized understanding. Pt set up on wait list due to not being able to call. Pt will try and set up Mychart.

## 2019-02-15 NOTE — Telephone Encounter (Signed)
Ok to have covid vaccination

## 2019-02-15 NOTE — Telephone Encounter (Signed)
The patient is wanting to get the COVID vaccination but was wanting to know if it is okay for her to get the vaccination with the medications that she is on.   Please advise

## 2019-02-22 ENCOUNTER — Other Ambulatory Visit: Payer: Self-pay | Admitting: Adult Health

## 2019-02-22 DIAGNOSIS — I10 Essential (primary) hypertension: Secondary | ICD-10-CM

## 2019-02-25 ENCOUNTER — Ambulatory Visit: Payer: Medicare HMO | Attending: Internal Medicine

## 2019-02-25 DIAGNOSIS — Z23 Encounter for immunization: Secondary | ICD-10-CM

## 2019-02-25 NOTE — Progress Notes (Signed)
   Covid-19 Vaccination Clinic  Name:  Hadiyyah Gibler    MRN: [id number] DOB: [birth year]  02/25/2019  Ms. Salyards was observed post Covid-19 immunization for 15 minutes without incidence. She was provided with Vaccine Information Sheet and instruction to access the V-Safe system.   Ms. Vonbehren was instructed to call 911 with any severe reactions post vaccine: Marland Kitchen Difficulty breathing  . Swelling of your face and throat  . A fast heartbeat  . A bad rash all over your body  . Dizziness and weakness    Immunizations Administered    Name Date Dose VIS Date Route   Pfizer COVID-19 Vaccine 02/25/2019  1:41 PM 0.3 mL 12/16/2018 Intramuscular   Manufacturer: [place]   Lot: [id number]   [place]: [id number]

## 2019-03-14 ENCOUNTER — Other Ambulatory Visit: Payer: Self-pay

## 2019-03-15 ENCOUNTER — Encounter: Payer: Self-pay | Admitting: Internal Medicine

## 2019-03-15 ENCOUNTER — Encounter: Payer: Self-pay | Admitting: Adult Health

## 2019-03-15 ENCOUNTER — Ambulatory Visit (INDEPENDENT_AMBULATORY_CARE_PROVIDER_SITE_OTHER): Payer: Medicare HMO | Admitting: Adult Health

## 2019-03-15 VITALS — BP 130/86 | Temp 97.8°F | Wt 222.0 lb

## 2019-03-15 DIAGNOSIS — I10 Essential (primary) hypertension: Secondary | ICD-10-CM | POA: Diagnosis not present

## 2019-03-15 DIAGNOSIS — K219 Gastro-esophageal reflux disease without esophagitis: Secondary | ICD-10-CM

## 2019-03-15 DIAGNOSIS — Z1211 Encounter for screening for malignant neoplasm of colon: Secondary | ICD-10-CM

## 2019-03-15 DIAGNOSIS — E785 Hyperlipidemia, unspecified: Secondary | ICD-10-CM

## 2019-03-15 DIAGNOSIS — Z0000 Encounter for general adult medical examination without abnormal findings: Secondary | ICD-10-CM

## 2019-03-15 LAB — LIPID PANEL
Cholesterol: 303 mg/dL — ABNORMAL HIGH (ref 0–200)
HDL: 50.8 mg/dL (ref >39.00)
LDL Cholesterol: 224 mg/dL — ABNORMAL HIGH (ref 0–99)
NonHDL: 252.07
Total CHOL/HDL Ratio: 6
Triglycerides: 139 mg/dL (ref 0.0–149.0)
VLDL: 27.8 mg/dL (ref 0.0–40.0)

## 2019-03-15 LAB — CBC WITH DIFFERENTIAL/PLATELET
Basophils Absolute: 0 10*3/uL (ref 0.0–0.1)
Basophils Relative: 0.5 % (ref 0.0–3.0)
Eosinophils Absolute: 0.2 10*3/uL (ref 0.0–0.7)
Eosinophils Relative: 2.6 % (ref 0.0–5.0)
HCT: 39.3 % (ref 36.0–46.0)
Hemoglobin: 13.2 g/dL (ref 12.0–15.0)
Lymphocytes Relative: 29.6 % (ref 12.0–46.0)
Lymphs Abs: 2.4 10*3/uL (ref 0.7–4.0)
MCHC: 33.5 g/dL (ref 30.0–36.0)
MCV: 89.1 fl (ref 78.0–100.0)
Monocytes Absolute: 0.7 10*3/uL (ref 0.1–1.0)
Monocytes Relative: 8.7 % (ref 3.0–12.0)
Neutro Abs: 4.7 10*3/uL (ref 1.4–7.7)
Neutrophils Relative %: 58.6 % (ref 43.0–77.0)
Platelets: 426 10*3/uL — ABNORMAL HIGH (ref 150.0–400.0)
RBC: 4.41 Mil/uL (ref 3.87–5.11)
RDW: 14.9 % (ref 11.5–15.5)
WBC: 8 10*3/uL (ref 4.0–10.5)

## 2019-03-15 LAB — COMPREHENSIVE METABOLIC PANEL
ALT: 12 U/L (ref 0–35)
AST: 14 U/L (ref 0–37)
Albumin: 4 g/dL (ref 3.5–5.2)
Alkaline Phosphatase: 61 U/L (ref 39–117)
BUN: 14 mg/dL (ref 6–23)
CO2: 31 mEq/L (ref 19–32)
Calcium: 9.7 mg/dL (ref 8.4–10.5)
Chloride: 101 mEq/L (ref 96–112)
Creatinine, Ser: 0.77 mg/dL (ref 0.40–1.20)
GFR: 90.37 mL/min (ref >60.00)
Glucose, Bld: 94 mg/dL (ref 70–99)
Potassium: 3.7 mEq/L (ref 3.5–5.1)
Sodium: 139 mEq/L (ref 135–145)
Total Bilirubin: 0.5 mg/dL (ref 0.2–1.2)
Total Protein: 7.2 g/dL (ref 6.0–8.3)

## 2019-03-15 LAB — TSH: TSH: 0.51 u[IU]/mL (ref 0.35–4.50)

## 2019-03-15 MED ORDER — PRAVASTATIN SODIUM 20 MG PO TABS: 20.0000 mg | ORAL_TABLET | ORAL | 3 refills | Status: DC

## 2019-03-15 MED ORDER — OMEPRAZOLE 20 MG PO CPDR: DELAYED_RELEASE_CAPSULE | ORAL | 3 refills | Status: DC

## 2019-03-15 NOTE — Progress Notes (Signed)
Subjective:    Patient ID: Heather Gibson, female    DOB: Apr 09, 1950, 69 y.o.   MRN: [id number]  HPI Patient presents for yearly preventative medicine examination. She is a pleasant 69 year old female who  has a past medical history of Constipation, GERD (gastroesophageal reflux disease), Hyperlipidemia, and Hypertension.  Essential Hypertension -controlled with Norvasc 10 mg and lisinopril/hydrochlorothiazide 20-25 mg.  She denies dizziness, lightheadedness, chest pain, shortness of breath BP Readings from Last 3 Encounters:  03/15/19 130/86  11/08/17 118/70  10/13/17 136/78   Hyperlipidemia -currently prescribed Crestor 20 mg daily. She is no longer taking this medication as it caused severe myalgia even when she took it every other day  Lab Results  Component Value Date   CHOL 234 (H) 06/16/2017   HDL 54.00 06/16/2017   LDLCALC 163 (H) 06/16/2017   TRIG 87.0 06/16/2017   CHOLHDL 4 06/16/2017   GERD - takes prilosec 20 mg daily- feels controlled.   All immunizations and health maintenance protocols were reviewed with the patient and needed orders were placed. She is due for PPSV 23.   Appropriate screening laboratory values were ordered for the patient including screening of hyperlipidemia, renal function and hepatic function. If indicated by BPH, a PSA was ordered.  Medication reconciliation,  past medical history, social history, problem list and allergies were reviewed in detail with the patient  Goals were established with regard to weight loss, exercise, and  diet in compliance with medications Wt Readings from Last 3 Encounters:  03/15/19 222 lb (100.7 kg)  11/08/17 214 lb (97.1 kg)  10/13/17 222 lb (100.7 kg)   She is going to schedule her mammogram and is overdue for colonoscopy  He has no acute issues today  Review of Systems  Constitutional: Negative.   HENT: Negative.   Eyes: Negative.   Respiratory: Negative.   Cardiovascular: Negative.     Gastrointestinal: Negative.   Endocrine: Negative.   Genitourinary: Negative.   Musculoskeletal: Negative.   Skin: Negative.   Allergic/Immunologic: Negative.   Neurological: Negative.   Hematological: Negative.   Psychiatric/Behavioral: Negative.    Past Medical History:  Diagnosis Date  . Constipation   . GERD (gastroesophageal reflux disease)   . Hyperlipidemia   . Hypertension     Social History   Socioeconomic History  . Marital status: Married    Spouse name: Not on file  . Number of children: Not on file  . Years of education: Not on file  . Highest education level: Not on file  Occupational History  . Not on file  Tobacco Use  . Smoking status: Never Smoker  . Smokeless tobacco: Never Used  Substance and Sexual Activity  . Alcohol use: No  . Drug use: No  . Sexual activity: Not Currently    Birth control/protection: Post-menopausal  Other Topics Concern  . 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 Difficulty of Paying Living Expenses: Not on file  Food Insecurity:   . Worried About Charity fundraiser in the Last Year: Not on file  . Ran Out of Food in the Last Year: Not on file  Transportation Needs:   . Lack of Transportation (Medical): Not on file  . Lack of Transportation (Non-Medical): Not  on file  Physical Activity:   . Days of Exercise per Week: Not on file  . Minutes of Exercise per Session: Not on file  Stress:   . Feeling of Stress : Not on file  Social Connections:   . Frequency of Communication with Friends and Family: Not on file  . Frequency of Social Gatherings with Friends and Family: Not on file  . Attends Religious Services: Not on file  . Active Member of Clubs or Organizations: Not on file  . Attends Archivist Meetings: Not on  file  . Marital Status: Not on file  Intimate Partner Violence:   . Fear of Current or Ex-Partner: Not on file  . Emotionally Abused: Not on file  . Physically Abused: Not on file  . Sexually Abused: Not on file    Past Surgical History:  Procedure Laterality Date  . ABDOMINAL HYSTERECTOMY    . COLPORRHAPHY    . PROLAPSED UTERINE FIBROID LIGATION     x2  . SALPINGOOPHORECTOMY Right     Family History  Problem Relation Age of Onset  . Breast cancer Mother   . Hypertension Mother   . Schizophrenia Mother   . Hypertension Maternal Grandmother   . Diabetes Maternal Grandmother   . Breast cancer Maternal Grandmother        ?    Allergies  Allergen Reactions  . Crestor [Rosuvastatin] Other (See Comments)    Myalgia   . Eggs Or Egg-Derived Products     Years ago dx- eats egg now without any problems  . Latex Swelling  . Milk-Related Compounds     Cannot have large amt of milk products  . Peanut-Containing Drug Products     Cannot have large amts of  . Shellfish Allergy Itching    Current Outpatient Medications on File Prior to Visit  Medication Sig Dispense Refill  . acetaminophen (TYLENOL) 325 MG tablet Take 650 mg by mouth every 6 (six) hours as needed.    Marland Kitchen amLODipine (NORVASC) 10 MG tablet TAKE 1 TABLET EVERY DAY 90 tablet 0  . diclofenac sodium (VOLTAREN) 1 % GEL Apply 2 g topically 4 (four) times daily as needed. 100 g 3  . lisinopril-hydrochlorothiazide (ZESTORETIC) 20-25 MG tablet TAKE 1 TABLET EVERY DAY 90 tablet 0   No current facility-administered medications on file prior to visit.    BP 130/86   Temp 97.8 F (36.6 C) (Temporal)   Wt 222 lb (100.7 kg)   BMI 37.52 kg/m       Objective:   Physical Exam Vitals and nursing note reviewed.  Constitutional:      General: She is not in acute distress.    Appearance: Normal appearance. She is well-developed. She is not ill-appearing.  HENT:     Head: Normocephalic and atraumatic.     Right Ear:  Tympanic membrane, ear canal and external ear normal. There is no impacted cerumen.     Left Ear: Tympanic membrane, ear canal and external ear normal. There is no impacted cerumen.     Nose: Nose normal. No congestion or rhinorrhea.     Mouth/Throat:     Mouth: Mucous membranes are moist.     Pharynx: Oropharynx is clear. No oropharyngeal exudate or posterior oropharyngeal erythema.  Eyes:     General:        Right eye: No discharge.        Left eye: No discharge.     Extraocular Movements: Extraocular movements intact.  Conjunctiva/sclera: Conjunctivae normal.     Pupils: Pupils are equal, round, and reactive to light.  Neck:     Thyroid: No thyromegaly.     Vascular: No carotid bruit.     Trachea: No tracheal deviation.  Cardiovascular:     Rate and Rhythm: Normal rate and regular rhythm.     Pulses: Normal pulses.     Heart sounds: Normal heart sounds. No murmur. No friction rub. No gallop.   Pulmonary:     Effort: Pulmonary effort is normal. No respiratory distress.     Breath sounds: Normal breath sounds. No stridor. No wheezing, rhonchi or rales.  Chest:     Chest wall: No tenderness.  Abdominal:     General: Abdomen is flat. Bowel sounds are normal. There is no distension.     Palpations: Abdomen is soft. There is no mass.     Tenderness: There is no abdominal tenderness. There is no right CVA tenderness, left CVA tenderness, guarding or rebound.     Hernia: No hernia is present.  Musculoskeletal:        General: No swelling, tenderness, deformity or signs of injury. Normal range of motion.     Cervical back: Normal range of motion and neck supple.     Right lower leg: No edema.     Left lower leg: No edema.  Lymphadenopathy:     Cervical: No cervical adenopathy.  Skin:    General: Skin is warm and dry.     Coloration: Skin is not jaundiced or pale.     Findings: No bruising, erythema, lesion or rash.  Neurological:     General: No focal deficit present.      Mental Status: She is alert and oriented to person, place, and time.     Cranial Nerves: No cranial nerve deficit.     Sensory: No sensory deficit.     Motor: No weakness.     Coordination: Coordination normal.     Gait: Gait normal.     Deep Tendon Reflexes: Reflexes normal.  Psychiatric:        Mood and Affect: Mood normal.        Behavior: Behavior normal.        Thought Content: Thought content normal.        Judgment: Judgment normal.        Assessment & Plan:  1. Routine general medical examination at a health care facility -Courage to start exercising and continue to eat healthy. -Follow-up in 1 year or sooner if needed - CBC with Differential/Platelet - Comprehensive metabolic panel - Lipid panel - TSH  2. Hyperlipidemia, unspecified hyperlipidemia type - Will trial switching her to pravastatin every other day  - pravastatin (PRAVACHOL) 20 MG tablet; Take 1 tablet (20 mg total) by mouth every other day.  Dispense: 45 tablet; Refill: 3 - CBC with Differential/Platelet - Comprehensive metabolic panel - Lipid panel - TSH  3. Essential hypertension - Well controlled. No change in medication  - CBC with Differential/Platelet - Comprehensive metabolic panel - Lipid panel - TSH  4. Gastroesophageal reflux disease  - omeprazole (PRILOSEC) 20 MG capsule; TAKE 1 CAPSULE(20 MG) BY MOUTH DAILY  Dispense: 90 capsule; Refill: 3  5. Colon cancer screening  - Ambulatory referral to Gastroenterology  Dorothyann Peng, NP

## 2019-03-15 NOTE — Patient Instructions (Signed)
It was great seeing you today   Someone will call you to schedule your colonoscopy   We will follow up with you regarding your blood work   Please call and schedule your mammogram   Start walking and eating healthy to help lose weight

## 2019-03-21 ENCOUNTER — Ambulatory Visit: Payer: Medicare HMO | Attending: Internal Medicine

## 2019-03-21 DIAGNOSIS — Z23 Encounter for immunization: Secondary | ICD-10-CM

## 2019-03-21 NOTE — Progress Notes (Signed)
   Covid-19 Vaccination Clinic  Name:  Heather Gibson    MRN: [id number] DOB: 16-Mar-1950  03/21/2019  Heather Gibson was observed post Covid-19 immunization for 15 minutes without incident. She was provided with Vaccine Information Sheet and instruction to access the V-Safe system.   Heather Gibson was instructed to call 911 with any severe reactions post vaccine: Marland Kitchen Difficulty breathing  . Swelling of face and throat  . A fast heartbeat  . A bad rash all over body  . Dizziness and weakness   Immunizations Administered    Name Date Dose VIS Date Route   Pfizer COVID-19 Vaccine 03/21/2019  1:12 PM 0.3 mL 12/16/2018 Intramuscular   Manufacturer: [place]   Lot: [id number]   [place]: [id number]

## 2019-04-18 ENCOUNTER — Other Ambulatory Visit: Payer: Self-pay

## 2019-04-18 ENCOUNTER — Ambulatory Visit (AMBULATORY_SURGERY_CENTER): Payer: Self-pay

## 2019-04-18 VITALS — Temp 97.4°F | Ht 67.0 in | Wt 226.6 lb

## 2019-04-18 DIAGNOSIS — Z86010 Personal history of colonic polyps: Secondary | ICD-10-CM

## 2019-04-18 MED ORDER — NA SULFATE-K SULFATE-MG SULF 17.5-3.13-1.6 GM/177ML PO SOLN: 1.0000 | Freq: Once | ORAL | 0 refills | Status: AC

## 2019-04-18 NOTE — Progress Notes (Signed)
No allergies to soy or  But had some hx issues with eggs  States she eats them now and in products.  Pt is not on blood thinners or diet pills  Denies issues with sedation/intubation Denies atrial flutter/fib Denies constipation uses miralax if needed.  Pt is aware of Covid safety and care partner requirements.

## 2019-05-01 ENCOUNTER — Encounter: Payer: Self-pay | Admitting: Internal Medicine

## 2019-05-01 ENCOUNTER — Other Ambulatory Visit: Payer: Self-pay

## 2019-05-01 ENCOUNTER — Ambulatory Visit (AMBULATORY_SURGERY_CENTER): Payer: Medicare HMO | Admitting: Internal Medicine

## 2019-05-01 VITALS — BP 110/60 | HR 98 | Temp 97.8°F | Resp 12 | Ht 67.0 in | Wt 226.6 lb

## 2019-05-01 DIAGNOSIS — Z86010 Personal history of colonic polyps: Secondary | ICD-10-CM | POA: Diagnosis not present

## 2019-05-01 DIAGNOSIS — D123 Benign neoplasm of transverse colon: Secondary | ICD-10-CM | POA: Diagnosis not present

## 2019-05-01 DIAGNOSIS — Z1211 Encounter for screening for malignant neoplasm of colon: Secondary | ICD-10-CM | POA: Diagnosis not present

## 2019-05-01 DIAGNOSIS — D122 Benign neoplasm of ascending colon: Secondary | ICD-10-CM

## 2019-05-01 DIAGNOSIS — D124 Benign neoplasm of descending colon: Secondary | ICD-10-CM | POA: Diagnosis not present

## 2019-05-01 MED ORDER — SODIUM CHLORIDE 0.9 % IV SOLN: 500.0000 mL | Freq: Once | INTRAVENOUS | Status: DC

## 2019-05-01 NOTE — Patient Instructions (Signed)
Information on polyps and hemorrhoids given to you today. ° °Await pathology results. ° °YOU HAD AN ENDOSCOPIC PROCEDURE TODAY AT THE [facility]:   Refer to the procedure report that was given to you for any specific questions about what was found during the examination.  If the procedure report does not answer your questions, please call your gastroenterologist to clarify.  If you requested that your care partner not be given the details of your procedure findings, then the procedure report has been included in a sealed envelope for you to review at your convenience later. ° °YOU SHOULD EXPECT: Some feelings of bloating in the abdomen. Passage of more gas than usual.  Walking can help get rid of the air that was put into your GI tract during the procedure and reduce the bloating. If you had a lower endoscopy (such as a colonoscopy or flexible sigmoidoscopy) you may notice spotting of blood in your stool or on the toilet paper. If you underwent a bowel prep for your procedure, you may not have a normal bowel movement for a few days. ° °Please Note:  You might notice some irritation and congestion in your nose or some drainage.  This is from the oxygen used during your procedure.  There is no need for concern and it should clear up in a day or so. ° °SYMPTOMS TO REPORT IMMEDIATELY: ° °· Following lower endoscopy (colonoscopy or flexible sigmoidoscopy): ° Excessive amounts of blood in the stool ° Significant tenderness or worsening of abdominal pains ° Swelling of the abdomen that is new, acute ° Fever of 100°F or higher ° ° °For urgent or emergent issues, a gastroenterologist can be reached at any hour by calling [phone]. °Do not use MyChart messaging for urgent concerns.  ° ° °DIET:  We do recommend a small meal at first, but then you may proceed to your regular diet.  Drink plenty of fluids but you should avoid alcoholic beverages for 24 hours. ° °ACTIVITY:  You should plan to take it easy for  the rest of today and you should NOT DRIVE or use heavy machinery until tomorrow (because of the sedation medicines used during the test).   ° °FOLLOW UP: °Our staff will call the number listed on your records 48-72 hours following your procedure to check on you and address any questions or concerns that you may have regarding the information given to you following your procedure. If we do not reach you, we will leave a message.  We will attempt to reach you two times.  During this call, we will ask if you have developed any symptoms of COVID 19. If you develop any symptoms (ie: fever, flu-like symptoms, shortness of breath, cough etc.) before then, please call [phone].  If you test positive for Covid 19 in the 2 weeks post procedure, please call and report this information to us.   ° °If any biopsies were taken you will be contacted by phone or by letter within the next 1-3 weeks.  Please call us at [phone] if you have not heard about the biopsies in 3 weeks.  ° ° °SIGNATURES/CONFIDENTIALITY: °You and/or your care partner have signed paperwork which will be entered into your electronic medical record.  These signatures attest to the fact that that the information above on your After Visit Summary has been reviewed and is understood.  Full responsibility of the confidentiality of this discharge information lies with you and/or your care-partner. °

## 2019-05-01 NOTE — Op Note (Signed)
[facility] Patient Name: Heather Gibson Procedure Date: 05/01/2019 11:17 AM MRN: [id number] Endoscopist: Jerene Bears , MD Age: 69 Referring MD:  Date of Birth: January 04, 1951 Gender: Female Account #: [id number] Procedure:                Colonoscopy Indications:              High risk colon cancer surveillance: Personal                            history of multiple (4) adenomas, Last colonoscopy:                            April 2017 Medicines:                Monitored Anesthesia Care Procedure:                Pre-Anesthesia Assessment:                           - Prior to the procedure, a History and Physical                            was performed, and patient medications and                            allergies were reviewed. The patient's tolerance of                            previous anesthesia was also reviewed. The risks                            and benefits of the procedure and the sedation                            options and risks were discussed with the patient.                            All questions were answered, and informed consent                            was obtained. Prior Anticoagulants: The patient has                            taken no previous anticoagulant or antiplatelet                            agents. ASA Grade Assessment: II - A patient with                            mild systemic disease. After reviewing the risks                            and benefits, the patient was deemed in  satisfactory condition to undergo the procedure.                           After obtaining informed consent, the colonoscope                            was passed under direct vision. Throughout the                            procedure, the patient's blood pressure, pulse, and                            oxygen saturations were monitored continuously. The                            Colonoscope was introduced through the anus and                             advanced to the cecum, identified by appendiceal                            orifice and ileocecal valve. The colonoscopy was                            performed without difficulty. The patient tolerated                            the procedure well. The quality of the bowel                            preparation was good. The ileocecal valve,                            appendiceal orifice, and rectum were photographed. Scope In: 11:30:24 AM Scope Out: 11:45:25 AM Scope Withdrawal Time: 0 hours 11 minutes 38 seconds  Total Procedure Duration: 0 hours 15 minutes 1 second  Findings:                 The digital rectal exam was normal.                           Two sessile polyps were found in the ascending                            colon. The polyps were 2 to 4 mm in size. These                            polyps were removed with a cold biopsy forceps.                            Resection and retrieval were complete.                           A 6 mm polyp was found in the  transverse colon. The                            polyp was sessile. The polyp was removed with a                            cold snare. Resection and retrieval were complete.                           A 4 mm polyp was found in the descending colon. The                            polyp was sessile. The polyp was removed with a                            cold snare. Resection and retrieval were complete.                           External and internal hemorrhoids were found during                            retroflexion and during endoscopy. The hemorrhoids                            were medium-sized. Complications:            No immediate complications. Estimated Blood Loss:     Estimated blood loss was minimal. Impression:               - Two 2 to 4 mm polyps in the ascending colon,                            removed with a cold biopsy forceps. Resected and                             retrieved.                           - One 6 mm polyp in the transverse colon, removed                            with a cold snare. Resected and retrieved.                           - One 4 mm polyp in the descending colon, removed                            with a cold snare. Resected and retrieved.                           - External and internal hemorrhoids. Recommendation:           - Patient has a contact number available for  emergencies. The signs and symptoms of potential                            delayed complications were discussed with the                            patient. Return to normal activities tomorrow.                            Written discharge instructions were provided to the                            patient.                           - Resume previous diet.                           - Continue present medications.                           - Await pathology results.                           - Repeat colonoscopy is recommended for                            surveillance. The colonoscopy date will be                            determined after pathology results from today's                            exam become available for review. Jerene Bears, MD 05/01/2019 11:48:24 AM This report has been signed electronically.

## 2019-05-01 NOTE — Progress Notes (Signed)
Called to room to assist during endoscopic procedure.  Patient ID and intended procedure confirmed with present staff. Received instructions for my participation in the procedure from the performing physician.  

## 2019-05-01 NOTE — Progress Notes (Signed)
A and O x3. Report to RN. Tolerated MAC anesthesia well.

## 2019-05-01 NOTE — Progress Notes (Signed)
Pt's states no medical or surgical changes since previsit or office visit.  Temp JB  Vitals KA

## 2019-05-02 ENCOUNTER — Telehealth: Payer: Self-pay | Admitting: Adult Health

## 2019-05-02 NOTE — Telephone Encounter (Signed)
Pt is requesting an appt to have injection in her knee for pain. Please follow up if ok to schedule.

## 2019-05-02 NOTE — Telephone Encounter (Signed)
Please schedule. thanks

## 2019-05-03 ENCOUNTER — Telehealth: Payer: Self-pay

## 2019-05-03 ENCOUNTER — Telehealth: Payer: Self-pay | Admitting: *Deleted

## 2019-05-03 NOTE — Telephone Encounter (Signed)
  Follow up Call-  Call back number 05/01/2019  Post procedure Call Back phone  # [phone]  Permission to leave phone message Yes  Some recent data might be hidden     Patient questions:  Do you have a fever, pain , or abdominal swelling? No. Pain Score  0 *  Have you tolerated food without any problems? Yes.    Have you been able to return to your normal activities? Yes.    Do you have any questions about your discharge instructions: Diet   No. Medications  No. Follow up visit  No.  Do you have questions or concerns about your Care? No.  Actions: * If pain score is 4 or above: 1. No action needed, pain <4.Have you developed a fever since your procedure? no  2.   Have you had an respiratory symptoms (SOB or cough) since your procedure? no  3.   Have you tested positive for COVID 19 since your procedure no  4.   Have you had any family members/close contacts diagnosed with the COVID 19 since your procedure?  no   If yes to any of these questions please route to Joylene John, RN and Erenest Rasher, RN

## 2019-05-03 NOTE — Telephone Encounter (Signed)
No answer for post procedure call back. Left message for patient and will call back later this afternoon.

## 2019-05-04 ENCOUNTER — Other Ambulatory Visit: Payer: Self-pay

## 2019-05-04 ENCOUNTER — Encounter: Payer: Self-pay | Admitting: Adult Health

## 2019-05-04 ENCOUNTER — Encounter: Payer: Self-pay | Admitting: Internal Medicine

## 2019-05-04 ENCOUNTER — Ambulatory Visit (INDEPENDENT_AMBULATORY_CARE_PROVIDER_SITE_OTHER): Payer: Medicare HMO | Admitting: Adult Health

## 2019-05-04 VITALS — BP 152/82 | Temp 98.3°F | Wt 226.0 lb

## 2019-05-04 DIAGNOSIS — M25512 Pain in left shoulder: Secondary | ICD-10-CM

## 2019-05-04 DIAGNOSIS — M25561 Pain in right knee: Secondary | ICD-10-CM | POA: Diagnosis not present

## 2019-05-04 DIAGNOSIS — M25511 Pain in right shoulder: Secondary | ICD-10-CM

## 2019-05-04 DIAGNOSIS — T148XXA Other injury of unspecified body region, initial encounter: Secondary | ICD-10-CM

## 2019-05-04 MED ORDER — CYCLOBENZAPRINE HCL 10 MG PO TABS: 10.0000 mg | ORAL_TABLET | Freq: Every day | ORAL | 0 refills | Status: DC

## 2019-05-04 MED ORDER — METHYLPREDNISOLONE 4 MG PO TBPK: ORAL_TABLET | ORAL | 0 refills | Status: DC
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
## 2019-05-05 NOTE — Telephone Encounter (Signed)
Called pt to schedule, she has appt on 05/16/2019 would like to have injections done on that day as well.

## 2019-05-15 ENCOUNTER — Other Ambulatory Visit: Payer: Self-pay

## 2019-05-16 ENCOUNTER — Ambulatory Visit (INDEPENDENT_AMBULATORY_CARE_PROVIDER_SITE_OTHER): Payer: Medicare HMO | Admitting: Adult Health

## 2019-05-16 ENCOUNTER — Encounter: Payer: Self-pay | Admitting: Adult Health

## 2019-05-16 VITALS — BP 144/70 | Temp 98.0°F | Wt 221.0 lb

## 2019-05-16 DIAGNOSIS — I10 Essential (primary) hypertension: Secondary | ICD-10-CM | POA: Diagnosis not present

## 2019-05-16 DIAGNOSIS — E785 Hyperlipidemia, unspecified: Secondary | ICD-10-CM

## 2019-05-16 DIAGNOSIS — M1711 Unilateral primary osteoarthritis, right knee: Secondary | ICD-10-CM

## 2019-05-16 LAB — HEPATIC FUNCTION PANEL
ALT: 14 U/L (ref 0–35)
AST: 17 U/L (ref 0–37)
Albumin: 4.3 g/dL (ref 3.5–5.2)
Alkaline Phosphatase: 81 U/L (ref 39–117)
Bilirubin, Direct: 0.1 mg/dL (ref 0.0–0.3)
Total Bilirubin: 0.7 mg/dL (ref 0.2–1.2)
Total Protein: 7.4 g/dL (ref 6.0–8.3)

## 2019-05-16 LAB — LIPID PANEL
Cholesterol: 269 mg/dL — ABNORMAL HIGH (ref 0–200)
HDL: 58.7 mg/dL (ref >39.00)
LDL Cholesterol: 179 mg/dL — ABNORMAL HIGH (ref 0–99)
NonHDL: 210.62
Total CHOL/HDL Ratio: 5
Triglycerides: 160 mg/dL — ABNORMAL HIGH (ref 0.0–149.0)
VLDL: 32 mg/dL (ref 0.0–40.0)

## 2019-05-16 MED ORDER — AMLODIPINE BESYLATE 10 MG PO TABS: 10.0000 mg | ORAL_TABLET | Freq: Every day | ORAL | 3 refills | Status: DC

## 2019-05-16 MED ORDER — METHYLPREDNISOLONE ACETATE 80 MG/ML IJ SUSP
80.0000 mg | Freq: Once | INTRAMUSCULAR | Status: AC
  Administered 2019-05-16: 80 mg via INTRA_ARTICULAR

## 2019-05-16 MED ORDER — CYCLOBENZAPRINE HCL 10 MG PO TABS: 10.0000 mg | ORAL_TABLET | Freq: Every day | ORAL | 0 refills | Status: DC

## 2019-05-16 MED ORDER — LISINOPRIL-HYDROCHLOROTHIAZIDE 20-25 MG PO TABS: 1.0000 | ORAL_TABLET | Freq: Every day | ORAL | 3 refills | Status: DC

## 2019-05-16 NOTE — Progress Notes (Signed)
Subjective:    Patient ID: Heather Gibson, female    DOB: 1950-03-28, 69 y.o.   MRN: [id number]  HPI 69 year old female who is being evaluated today for follow-up regarding hyperlipidemia.  In 2 months ago she was switched from Crestor to pravastatin as Crestor was causing myalgia even when she took it every other day.  Today she reports that since the switch she has not had any issues with myalgia.  Lab Results  Component Value Date   CHOL 303 (H) 03/15/2019   HDL 50.80 03/15/2019   LDLCALC 224 (H) 03/15/2019   TRIG 139.0 03/15/2019   CHOLHDL 6 03/15/2019   Additionally she would like to have another right knee injection. Her last injection was in 2019 and responded well, over the last 3 months or so she noticed increase pain to the right knee with mild swelling.  Her most recent xray in November 2019 showed   Mild tricompartmental degenerative change of the knee.  She also needs refills of her blood pressure medication norvasc and hyzaar. She does monitor her BP at home and reports readings in the 120's/80's at home. She denies dizziness, lightheadedness, or blurred vision.    Review of Systems See HPI   Past Medical History:  Diagnosis Date  . Arthritis   . Constipation   . GERD (gastroesophageal reflux disease)   . Hyperlipidemia   . Hypertension     Social History   Socioeconomic History  . Marital status: Married    Spouse name: Not on file  . Number of children: Not on file  . Years of education: Not on file  . Highest education level: Not on file  Occupational History  . Not on file  Tobacco Use  . Smoking status: Never Smoker  . Smokeless tobacco: Never Used  Substance and Sexual Activity  . Alcohol use: No  . Drug use: No  . Sexual activity: Not Currently    Birth control/protection: Post-menopausal  Other Topics Concern  . 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 Difficulty of Paying Living Expenses:   Food Insecurity:   . Worried About Charity fundraiser in the Last Year:   . Arboriculturist in the Last Year:   Transportation Needs:   . Film/video editor (Medical):   Marland Kitchen Lack of Transportation (Non-Medical):   Physical Activity:   . Days of Exercise per Week:   . Minutes of Exercise per Session:   Stress:   . Feeling of Stress :   Social Connections:   . Frequency of Communication with Friends and Family:   . Frequency of Social Gatherings with Friends and Family:   . Attends Religious Services:   . Active Member of Clubs or Organizations:   . Attends Archivist Meetings:   Marland Kitchen Marital Status:   Intimate Partner Violence:   . Fear of Current or Ex-Partner:   . Emotionally Abused:   Marland Kitchen Physically Abused:   . Sexually Abused:     Past Surgical History:  Procedure Laterality Date  . ABDOMINAL HYSTERECTOMY    . COLONOSCOPY  2017  . COLPORRHAPHY    . PROLAPSED UTERINE FIBROID LIGATION     x2  . SALPINGOOPHORECTOMY Right  Family History  Problem Relation Age of Onset  . Breast cancer Mother   . Hypertension Mother   . Schizophrenia Mother   . Hypertension Maternal Grandmother   . Diabetes Maternal Grandmother   . Breast cancer Maternal Grandmother        ?  . Colon cancer Neg Hx   . Colon polyps Neg Hx   . Esophageal cancer Neg Hx   . Rectal cancer Neg Hx   . Stomach cancer Neg Hx     Allergies  Allergen Reactions  . Crestor [Rosuvastatin] Other (See Comments)    Myalgia   . Eggs Or Egg-Derived Products     Years ago dx- eats egg now without any problems  . Latex Swelling  . Milk-Related Compounds     Cannot have large amt of milk products  . Peanut-Containing Drug Products     Cannot have large amts of  . Shellfish Allergy Itching    Current Outpatient Medications on File Prior to Visit   Medication Sig Dispense Refill  . acetaminophen (TYLENOL) 325 MG tablet Take 650 mg by mouth every 6 (six) hours as needed.    . diclofenac sodium (VOLTAREN) 1 % GEL Apply 2 g topically 4 (four) times daily as needed. 100 g 3  . omeprazole (PRILOSEC) 20 MG capsule TAKE 1 CAPSULE(20 MG) BY MOUTH DAILY 90 capsule 3  . pravastatin (PRAVACHOL) 20 MG tablet Take 1 tablet (20 mg total) by mouth every other day. 45 tablet 3   No current facility-administered medications on file prior to visit.    BP (!) 144/70   Temp 98 F (36.7 C)   Wt 221 lb (100.2 kg)   BMI 34.61 kg/m       Objective:   Physical Exam Vitals and nursing note reviewed.  Constitutional:      Appearance: Normal appearance.  Cardiovascular:     Rate and Rhythm: Normal rate and regular rhythm.     Pulses: Normal pulses.     Heart sounds: Normal heart sounds.  Pulmonary:     Effort: Pulmonary effort is normal.     Breath sounds: Normal breath sounds.  Musculoskeletal:     Right knee: Swelling and bony tenderness present. No effusion, erythema or crepitus. Decreased range of motion.     Left knee: Normal.  Skin:    General: Skin is warm and dry.  Neurological:     General: No focal deficit present.     Mental Status: She is alert and oriented to person, place, and time.     Gait: Gait abnormal (limping gait ).  Psychiatric:        Mood and Affect: Mood normal.        Behavior: Behavior normal.        Thought Content: Thought content normal.        Judgment: Judgment normal.        Assessment & Plan:  1. Hyperlipidemia, unspecified hyperlipidemia type - Continue pravastatin as there are no side effects of this medication  - Lipid panel - Hepatic function panel  2. Primary osteoarthritis of right knee - Acute exacerbation - will do depo medrol injection today since she has responded well to this in the past.  Discussed risks and benefits of corticosteroid injection and patient consented.  After prepping  skin with betadine, injected 80 mg depomedrol and 2 cc of plain xylocaine with 22 gauge one and one half inch needle using anterolateral approach and pt tolerated well. -  methylPREDNISolone acetate (DEPO-MEDROL) injection 80 mg  3. Essential hypertension  - lisinopril-hydrochlorothiazide (ZESTORETIC) 20-25 MG tablet; Take 1 tablet by mouth daily.  Dispense: 90 tablet; Refill: 3 - amLODipine (NORVASC) 10 MG tablet; Take 1 tablet (10 mg total) by mouth daily.  Dispense: 90 tablet; Refill: 3   Dorothyann Peng, NP

## 2019-05-31 ENCOUNTER — Telehealth: Payer: Self-pay | Admitting: Adult Health

## 2019-05-31 DIAGNOSIS — T148XXA Other injury of unspecified body region, initial encounter: Secondary | ICD-10-CM

## 2019-05-31 DIAGNOSIS — M25511 Pain in right shoulder: Secondary | ICD-10-CM

## 2019-05-31 DIAGNOSIS — M25561 Pain in right knee: Secondary | ICD-10-CM

## 2019-05-31 NOTE — Telephone Encounter (Signed)
Is the pain getting any better?

## 2019-05-31 NOTE — Telephone Encounter (Signed)
Spoke to the pt.  She states the pain is not better or worse.  It is the same.  She continues to take the muscle relaxers.

## 2019-05-31 NOTE — Telephone Encounter (Signed)
LEFT A MESSAGE FOR A RETURN CALL

## 2019-05-31 NOTE — Telephone Encounter (Signed)
I would like to send her to PT

## 2019-05-31 NOTE — Telephone Encounter (Signed)
Left a message for a return call.
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
## 2019-05-31 NOTE — Telephone Encounter (Signed)
Pt want to leave a message for Tommi Rumps stating its personal and did not what to state the reason for call. [phone]

## 2019-06-01 NOTE — Telephone Encounter (Signed)
Called pt 2x on both numbers no answer. Will try again shortly.

## 2019-06-01 NOTE — Telephone Encounter (Signed)
Pt agreed to PT. Pt referral has been placed!

## 2019-06-01 NOTE — Telephone Encounter (Signed)
Spoke to pt and advise of Cory's annotation. Pt verbalized understanding. Pt stated that she has 3 pills left of the medication and will try to make them last until Tommi Rumps is back in the office to see if a refill is appropriate.   Upon returning [facility] for refills?

## 2019-06-06 ENCOUNTER — Other Ambulatory Visit: Payer: Self-pay

## 2019-06-06 MED ORDER — CYCLOBENZAPRINE HCL 10 MG PO TABS: 10.0000 mg | ORAL_TABLET | Freq: Three times a day (TID) | ORAL | 0 refills | Status: DC

## 2019-06-06 NOTE — Telephone Encounter (Signed)
[place] for 5 days TID

## 2019-06-06 NOTE — Telephone Encounter (Signed)
Rx sent to pharmacy for 5 day supply TID. Tried to call pt to advise of update no answer. Advised via [place]. No further action needed at this time.

## 2019-06-27 ENCOUNTER — Telehealth: Payer: Self-pay | Admitting: *Deleted

## 2019-06-27 DIAGNOSIS — Z0000 Encounter for general adult medical examination without abnormal findings: Secondary | ICD-10-CM

## 2019-06-27 DIAGNOSIS — K219 Gastro-esophageal reflux disease without esophagitis: Secondary | ICD-10-CM

## 2019-06-27 DIAGNOSIS — E785 Hyperlipidemia, unspecified: Secondary | ICD-10-CM

## 2019-06-27 MED ORDER — OMEPRAZOLE 20 MG PO CPDR: DELAYED_RELEASE_CAPSULE | ORAL | 2 refills | Status: DC

## 2019-06-27 MED ORDER — PRAVASTATIN SODIUM 20 MG PO TABS: 20.0000 mg | ORAL_TABLET | ORAL | 2 refills | Status: DC

## 2019-06-27 NOTE — Telephone Encounter (Signed)
Spoke to the pharmacy.  Pt needed omeprazole and pravastatin sent to mail order pharmacy.  Sent by e-scribe.  Nothing further needed.

## 2019-06-27 NOTE — Telephone Encounter (Signed)
[place] is from [employer] and calling in reference to a new prescription sent over

## 2019-07-04 ENCOUNTER — Telehealth: Payer: Self-pay | Admitting: Adult Health

## 2019-07-04 NOTE — Telephone Encounter (Signed)
Noted.  Pt can be evaluated for medication in appointment.  Nothing further needed.

## 2019-07-04 NOTE — Telephone Encounter (Signed)
Pt calling in wanting to have a medication sent in for pain all over pt was advised to make an appointment so that he will know what kind of medication to prescribe.  Pt has a virtual appointment scheduled.

## 2019-07-05 ENCOUNTER — Telehealth: Payer: Medicare HMO | Admitting: Adult Health

## 2019-07-11 ENCOUNTER — Other Ambulatory Visit: Payer: Self-pay

## 2019-07-11 ENCOUNTER — Ambulatory Visit: Payer: Medicare HMO | Attending: Adult Health

## 2019-07-11 DIAGNOSIS — G8929 Other chronic pain: Secondary | ICD-10-CM

## 2019-07-11 DIAGNOSIS — M546 Pain in thoracic spine: Secondary | ICD-10-CM | POA: Insufficient documentation

## 2019-07-11 DIAGNOSIS — M25511 Pain in right shoulder: Secondary | ICD-10-CM | POA: Insufficient documentation

## 2019-07-11 DIAGNOSIS — M542 Cervicalgia: Secondary | ICD-10-CM | POA: Diagnosis not present

## 2019-07-11 DIAGNOSIS — M25561 Pain in right knee: Secondary | ICD-10-CM | POA: Insufficient documentation

## 2019-07-11 DIAGNOSIS — M25512 Pain in left shoulder: Secondary | ICD-10-CM | POA: Diagnosis not present

## 2019-07-11 DIAGNOSIS — R252 Cramp and spasm: Secondary | ICD-10-CM | POA: Diagnosis not present
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
## 2019-07-11 NOTE — Patient Instructions (Signed)
Access Code: [email or web address] URL: [email or web address] Date: 07/11/2019 Prepared by: Claiborne Billings  Exercises Seated Cervical Flexion AROM - 3 x daily - 7 x weekly - 3 reps - 1 sets - 20 hold Seated Cervical Sidebending AROM - 3 x daily - 7 x weekly - 3 reps - 1 sets - 20 hold Seated Cervical Rotation AROM - 3 x daily - 7 x weekly - 3 reps - 1 sets - 20 hold Seated Correct Posture - 1 x daily - 7 x weekly - 10 reps - 3 sets Supine Suboccipital Release with Tennis Balls - 1 x daily - 7 x weekly - 3 sets - 10 reps Seated Hamstring Stretch - 3 x daily - 7 x weekly - 1 sets - 3 reps - 20 hold Seated Long Arc Quad - 2 x daily - 7 x weekly - 2 sets - 5 reps

## 2019-07-12 ENCOUNTER — Other Ambulatory Visit: Payer: Self-pay

## 2019-07-12 ENCOUNTER — Telehealth (INDEPENDENT_AMBULATORY_CARE_PROVIDER_SITE_OTHER): Payer: Medicare HMO | Admitting: Adult Health

## 2019-07-12 ENCOUNTER — Encounter: Payer: Self-pay | Admitting: Adult Health

## 2019-07-12 VITALS — BP 140/80 | Wt 214.0 lb

## 2019-07-12 DIAGNOSIS — G720 Drug-induced myopathy: Secondary | ICD-10-CM | POA: Diagnosis not present

## 2019-07-12 DIAGNOSIS — E785 Hyperlipidemia, unspecified: Secondary | ICD-10-CM

## 2019-07-12 DIAGNOSIS — T148XXA Other injury of unspecified body region, initial encounter: Secondary | ICD-10-CM

## 2019-07-12 MED ORDER — CYCLOBENZAPRINE HCL 10 MG PO TABS: 10.0000 mg | ORAL_TABLET | Freq: Three times a day (TID) | ORAL | 0 refills | Status: DC | PRN
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
## 2019-07-18 ENCOUNTER — Encounter: Payer: Self-pay | Admitting: Physical Therapy

## 2019-07-18 ENCOUNTER — Other Ambulatory Visit: Payer: Self-pay

## 2019-07-18 ENCOUNTER — Ambulatory Visit: Payer: Medicare HMO | Admitting: Physical Therapy

## 2019-07-18 DIAGNOSIS — M25511 Pain in right shoulder: Secondary | ICD-10-CM

## 2019-07-18 DIAGNOSIS — G8929 Other chronic pain: Secondary | ICD-10-CM

## 2019-07-18 DIAGNOSIS — M546 Pain in thoracic spine: Secondary | ICD-10-CM

## 2019-07-18 DIAGNOSIS — R252 Cramp and spasm: Secondary | ICD-10-CM

## 2019-07-18 DIAGNOSIS — M542 Cervicalgia: Secondary | ICD-10-CM

## 2019-07-18 DIAGNOSIS — M25512 Pain in left shoulder: Secondary | ICD-10-CM | POA: Diagnosis not present

## 2019-07-18 DIAGNOSIS — M25561 Pain in right knee: Secondary | ICD-10-CM | POA: Diagnosis not present

## 2019-07-18 NOTE — Patient Instructions (Signed)
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
## 2019-07-25 ENCOUNTER — Ambulatory Visit: Payer: Medicare HMO | Admitting: Physical Therapy

## 2019-07-27 ENCOUNTER — Encounter: Payer: Self-pay | Admitting: Physical Therapy

## 2019-07-27 ENCOUNTER — Ambulatory Visit: Payer: Medicare HMO | Admitting: Physical Therapy

## 2019-07-27 ENCOUNTER — Other Ambulatory Visit: Payer: Self-pay

## 2019-07-27 DIAGNOSIS — M542 Cervicalgia: Secondary | ICD-10-CM | POA: Diagnosis not present

## 2019-07-27 DIAGNOSIS — G8929 Other chronic pain: Secondary | ICD-10-CM

## 2019-07-27 DIAGNOSIS — M546 Pain in thoracic spine: Secondary | ICD-10-CM | POA: Diagnosis not present

## 2019-07-27 DIAGNOSIS — M25512 Pain in left shoulder: Secondary | ICD-10-CM

## 2019-07-27 DIAGNOSIS — R252 Cramp and spasm: Secondary | ICD-10-CM | POA: Diagnosis not present

## 2019-07-27 DIAGNOSIS — M25511 Pain in right shoulder: Secondary | ICD-10-CM | POA: Diagnosis not present

## 2019-07-27 DIAGNOSIS — M25561 Pain in right knee: Secondary | ICD-10-CM | POA: Diagnosis not present
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
## 2019-08-03 ENCOUNTER — Encounter: Payer: Self-pay | Admitting: Adult Health

## 2019-08-03 ENCOUNTER — Ambulatory Visit: Payer: Medicare HMO | Admitting: Physical Therapy

## 2019-08-03 ENCOUNTER — Other Ambulatory Visit: Payer: Self-pay

## 2019-08-03 ENCOUNTER — Ambulatory Visit (INDEPENDENT_AMBULATORY_CARE_PROVIDER_SITE_OTHER): Payer: Medicare HMO | Admitting: Adult Health

## 2019-08-03 VITALS — BP 132/82 | Temp 97.8°F | Wt 223.0 lb

## 2019-08-03 DIAGNOSIS — G8929 Other chronic pain: Secondary | ICD-10-CM

## 2019-08-03 DIAGNOSIS — M25561 Pain in right knee: Secondary | ICD-10-CM

## 2019-08-03 MED ORDER — METHYLPREDNISOLONE ACETATE 80 MG/ML IJ SUSP
80.0000 mg | Freq: Once | INTRAMUSCULAR | Status: AC
  Administered 2019-08-03: 80 mg via INTRA_ARTICULAR

## 2019-08-03 NOTE — Progress Notes (Signed)
Subjective:    Patient ID: Heather Gibson, female    DOB: 07-Jan-1950, 69 y.o.   MRN: [id number]  HPI 69 year old female who is being evaluated today for her chronic bilateral knee pain.  Pain is worse in the right.  She reports pain is more apparent in the morning when she gets out of bed and when she goes to bed at night but during the day when she gets moving the pain dissipates somewhat.  She is currently working through physical therapy for left shoulder, left upper back, and knee pain he reports improvement with physical therapy.  She is taking Tylenol Motrin which does not seem to help with the pain.  Pain is described as "sore and achy".  She had an MRI in 2019 of the right knee that showed mild tricompartmental osteoarthritis.   Review of Systems See HPI   Past Medical History:  Diagnosis Date  . Arthritis   . Constipation   . GERD (gastroesophageal reflux disease)   . Hyperlipidemia   . Hypertension     Social History   Socioeconomic History  . Marital status: Married    Spouse name: Not on file  . Number of children: Not on file  . Years of education: Not on file  . Highest education level: Not on file  Occupational History  . Not on file  Tobacco Use  . Smoking status: Never Smoker  . Smokeless tobacco: Never Used  Vaping Use  . Vaping Use: Never used  Substance and Sexual Activity  . Alcohol use: No  . Drug use: No  . Sexual activity: Not Currently    Birth control/protection: Post-menopausal  Other Topics Concern  . 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 Difficulty of Paying Living Expenses:   Food Insecurity:   . Worried About Charity fundraiser in the Last Year:   . Arboriculturist in the Last Year:   Transportation Needs:   . Lexicographer (Medical):   Marland Kitchen Lack of Transportation (Non-Medical):   Physical Activity:   . Days of Exercise per Week:   . Minutes of Exercise per Session:   Stress:   . Feeling of Stress :   Social Connections:   . Frequency of Communication with Friends and Family:   . Frequency of Social Gatherings with Friends and Family:   . Attends Religious Services:   . Active Member of Clubs or Organizations:   . Attends Archivist Meetings:   Marland Kitchen Marital Status:   Intimate Partner Violence:   . Fear of Current or Ex-Partner:   . Emotionally Abused:   Marland Kitchen Physically Abused:   . Sexually Abused:     Past Surgical History:  Procedure Laterality Date  . ABDOMINAL HYSTERECTOMY    . COLONOSCOPY  2017  . COLPORRHAPHY    . PROLAPSED UTERINE FIBROID LIGATION     x2  . SALPINGOOPHORECTOMY Right     Family History  Problem Relation Age of Onset  . Breast cancer Mother   . Hypertension Mother   . Schizophrenia Mother   . Hypertension Maternal Grandmother   . Diabetes Maternal Grandmother   . Breast cancer Maternal Grandmother        ?  Marland Kitchen  Colon cancer Neg Hx   . Colon polyps Neg Hx   . Esophageal cancer Neg Hx   . Rectal cancer Neg Hx   . Stomach cancer Neg Hx     Allergies  Allergen Reactions  . Crestor [Rosuvastatin] Other (See Comments)    Myalgia   . Eggs Or Egg-Derived Products     Years ago dx- eats egg now without any problems  . Latex Swelling  . Milk-Related Compounds     Cannot have large amt of milk products  . Peanut-Containing Drug Products     Cannot have large amts of  . Shellfish Allergy Itching    Current Outpatient Medications on File Prior to Visit  Medication Sig Dispense Refill  . acetaminophen (TYLENOL) 325 MG tablet Take 650 mg by mouth every 6 (six) hours as needed.    Marland Kitchen amLODipine (NORVASC) 10 MG tablet Take 1 tablet (10 mg total) by mouth daily. 90 tablet 3  . cyclobenzaprine (FLEXERIL) 10 MG tablet Take 1 tablet (10 mg total) by mouth 3  (three) times daily as needed for muscle spasms. 30 tablet 0  . lisinopril-hydrochlorothiazide (ZESTORETIC) 20-25 MG tablet Take 1 tablet by mouth daily. 90 tablet 3  . omeprazole (PRILOSEC) 20 MG capsule TAKE 1 CAPSULE(20 MG) BY MOUTH DAILY 90 capsule 2  . diclofenac sodium (VOLTAREN) 1 % GEL Apply 2 g topically 4 (four) times daily as needed. (Patient not taking: Reported on 08/03/2019) 100 g 3   No current facility-administered medications on file prior to visit.    BP (!) 132/82   Temp 97.8 F (36.6 C)   Wt (!) 223 lb (101.2 kg)   BMI 34.93 kg/m       Objective:   Physical Exam Vitals and nursing note reviewed.  Constitutional:      Appearance: Normal appearance. She is obese.  Musculoskeletal:        General: No swelling. Normal range of motion.     Right knee: No bony tenderness or crepitus. Normal range of motion. No tenderness.     Left knee: Normal. No bony tenderness or crepitus. Normal range of motion. No tenderness.  Skin:    General: Skin is warm and dry.  Neurological:     General: No focal deficit present.     Mental Status: She is alert and oriented to person, place, and time.  Psychiatric:        Mood and Affect: Mood normal.        Behavior: Behavior normal.        Thought Content: Thought content normal.        Judgment: Judgment normal.       Assessment & Plan:  1. Chronic pain of right knee -Pain from arthritis.  We discussed options including steroid injection and referral to orthopedic.  She is not keen on having surgery at this time.  He would like to try a steroid injection first.  Discussed risks and benefits of corticosteroid injection and patient consented.  After prepping skin with betadine, injected 80 mg depomedrol and 2 cc of plain xylocaine with 22 gauge one and one half inch needle using anterolateral approach and pt tolerated well.  Follow up instructions reviewed   - methylPREDNISolone acetate (DEPO-MEDROL) injection 80 mg  Dorothyann Peng, NP

## 2019-08-07 ENCOUNTER — Other Ambulatory Visit: Payer: Self-pay | Admitting: Adult Health

## 2019-08-07 DIAGNOSIS — T148XXA Other injury of unspecified body region, initial encounter: Secondary | ICD-10-CM

## 2019-08-08 ENCOUNTER — Other Ambulatory Visit: Payer: Self-pay

## 2019-08-08 ENCOUNTER — Encounter: Payer: Self-pay | Admitting: Physical Therapy

## 2019-08-08 ENCOUNTER — Ambulatory Visit: Payer: Medicare HMO | Attending: Adult Health | Admitting: Physical Therapy

## 2019-08-08 DIAGNOSIS — R252 Cramp and spasm: Secondary | ICD-10-CM | POA: Diagnosis not present

## 2019-08-08 DIAGNOSIS — M25512 Pain in left shoulder: Secondary | ICD-10-CM | POA: Diagnosis not present

## 2019-08-08 DIAGNOSIS — Z20822 Contact with and (suspected) exposure to covid-19: Secondary | ICD-10-CM | POA: Diagnosis not present

## 2019-08-08 DIAGNOSIS — G8929 Other chronic pain: Secondary | ICD-10-CM | POA: Diagnosis not present

## 2019-08-08 DIAGNOSIS — M546 Pain in thoracic spine: Secondary | ICD-10-CM | POA: Diagnosis not present

## 2019-08-08 DIAGNOSIS — M542 Cervicalgia: Secondary | ICD-10-CM

## 2019-08-08 DIAGNOSIS — M25511 Pain in right shoulder: Secondary | ICD-10-CM | POA: Insufficient documentation

## 2019-08-08 DIAGNOSIS — M25561 Pain in right knee: Secondary | ICD-10-CM | POA: Insufficient documentation
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
## 2019-08-11 ENCOUNTER — Encounter: Payer: Medicare HMO | Admitting: Physical Therapy

## 2019-08-17 ENCOUNTER — Ambulatory Visit: Payer: Medicare HMO | Admitting: Physical Therapy

## 2019-08-17 ENCOUNTER — Other Ambulatory Visit: Payer: Self-pay

## 2019-08-17 ENCOUNTER — Encounter: Payer: Self-pay | Admitting: Physical Therapy

## 2019-08-17 DIAGNOSIS — M25512 Pain in left shoulder: Secondary | ICD-10-CM | POA: Diagnosis not present

## 2019-08-17 DIAGNOSIS — R252 Cramp and spasm: Secondary | ICD-10-CM | POA: Diagnosis not present

## 2019-08-17 DIAGNOSIS — M542 Cervicalgia: Secondary | ICD-10-CM | POA: Diagnosis not present

## 2019-08-17 DIAGNOSIS — M546 Pain in thoracic spine: Secondary | ICD-10-CM

## 2019-08-17 DIAGNOSIS — M25511 Pain in right shoulder: Secondary | ICD-10-CM | POA: Diagnosis not present

## 2019-08-17 DIAGNOSIS — G8929 Other chronic pain: Secondary | ICD-10-CM | POA: Diagnosis not present

## 2019-08-17 DIAGNOSIS — M25561 Pain in right knee: Secondary | ICD-10-CM | POA: Diagnosis not present
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
## 2019-08-22 ENCOUNTER — Other Ambulatory Visit: Payer: Self-pay

## 2019-08-22 ENCOUNTER — Ambulatory Visit: Payer: Medicare HMO | Admitting: Physical Therapy

## 2019-08-22 DIAGNOSIS — G8929 Other chronic pain: Secondary | ICD-10-CM | POA: Diagnosis not present

## 2019-08-22 DIAGNOSIS — M25561 Pain in right knee: Secondary | ICD-10-CM | POA: Diagnosis not present

## 2019-08-22 DIAGNOSIS — R252 Cramp and spasm: Secondary | ICD-10-CM | POA: Diagnosis not present

## 2019-08-22 DIAGNOSIS — M25511 Pain in right shoulder: Secondary | ICD-10-CM | POA: Diagnosis not present

## 2019-08-22 DIAGNOSIS — M546 Pain in thoracic spine: Secondary | ICD-10-CM | POA: Diagnosis not present

## 2019-08-22 DIAGNOSIS — M25512 Pain in left shoulder: Secondary | ICD-10-CM | POA: Diagnosis not present

## 2019-08-22 DIAGNOSIS — M542 Cervicalgia: Secondary | ICD-10-CM | POA: Diagnosis not present
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
## 2019-08-22 NOTE — Patient Instructions (Signed)
Access Code: [email or web address] URL: [email or web address] Date: 08/22/2019 Prepared by: Ruben Im  Exercises Seated Cervical Flexion AROM - 3 x daily - 7 x weekly - 3 reps - 1 sets - 20 hold Seated Cervical Sidebending AROM - 3 x daily - 7 x weekly - 3 reps - 1 sets - 20 hold Seated Correct Posture - 1 x daily - 7 x weekly - 10 reps - 3 sets Supine Suboccipital Release with Tennis Balls - 1 x daily - 7 x weekly - 3 sets - 10 reps Seated Hamstring Stretch - 3 x daily - 7 x weekly - 1 sets - 3 reps - 20 hold Seated Long Arc Quad - 2 x daily - 7 x weekly - 2 sets - 5 reps Seated Assisted Cervical Rotation with Towel - 1 x daily - 7 x weekly - 2 sets - 10 reps Sit to Stand - 1 x daily - 7 x weekly - 1 sets - 10 reps Half Dead Lift with Kettlebell - 1 x daily - 7 x weekly - 1 sets - 10 reps Standing Isometric Cervical Retraction with Chin Tucks and Ball at [employer] - 1 x daily - 7 x weekly - 1 sets - 10 reps

## 2019-08-29 ENCOUNTER — Ambulatory Visit: Payer: Medicare HMO | Admitting: Physical Therapy

## 2019-08-29 ENCOUNTER — Other Ambulatory Visit: Payer: Self-pay

## 2019-08-29 ENCOUNTER — Encounter: Payer: Self-pay | Admitting: Physical Therapy

## 2019-08-29 DIAGNOSIS — M25511 Pain in right shoulder: Secondary | ICD-10-CM

## 2019-08-29 DIAGNOSIS — M546 Pain in thoracic spine: Secondary | ICD-10-CM

## 2019-08-29 DIAGNOSIS — G8929 Other chronic pain: Secondary | ICD-10-CM | POA: Diagnosis not present

## 2019-08-29 DIAGNOSIS — R252 Cramp and spasm: Secondary | ICD-10-CM | POA: Diagnosis not present

## 2019-08-29 DIAGNOSIS — M542 Cervicalgia: Secondary | ICD-10-CM

## 2019-08-29 DIAGNOSIS — M25561 Pain in right knee: Secondary | ICD-10-CM

## 2019-08-29 DIAGNOSIS — M25512 Pain in left shoulder: Secondary | ICD-10-CM | POA: Diagnosis not present

## 2019-08-29 NOTE — Therapy (Signed)
[facility] [address] [place], [place], [address] Phone: [phone]   Fax:  [phone]  Physical Therapy Treatment  Patient Details  Name: Heather Gibson MRN: [id number] Date of Birth: [birth year] Referring Provider (PT): Dorothyann Peng, MD   Encounter Date: 08/29/2019   PT End of Session - 08/29/19 0932    Visit Number 7    Date for PT Re-Evaluation 09/05/19    Authorization Type [employer]here  12 visits approved until 09/05/19    PT Start Time 0934    PT Stop Time 1015    PT Time Calculation (min) 41 min    Activity Tolerance Patient tolerated treatment well;No increased pain    Behavior During Therapy WFL for tasks assessed/performed           Past Medical History:  Diagnosis Date   Arthritis    Constipation    GERD (gastroesophageal reflux disease)    Hyperlipidemia    Hypertension     Past Surgical History:  Procedure Laterality Date   ABDOMINAL HYSTERECTOMY     COLONOSCOPY  2017   COLPORRHAPHY     PROLAPSED UTERINE FIBROID LIGATION     x2   SALPINGOOPHORECTOMY Right     There were no vitals filed for this visit.   Subjective Assessment - 08/29/19 1102    Subjective Pt denies pain just neck stiffness. Pt states she is feeling better and thinks she would like to discharge from PT today.    Patient Stated Goals reduce pain, return to walking    Currently in Pain? No/denies                             [facility] Adult PT Treatment/Exercise - 08/29/19 0001      Neck Exercises: Seated   Other Seated Exercise thoracic extension    Other Seated Exercise cervical retraction      Knee/Hip Exercises: Standing   Other Standing Knee Exercises holding 5# weight dead lifts to knee level       Knee/Hip Exercises: Seated   Sit to Sand 2 sets;5 reps   black foam     Manual Therapy   Soft tissue mobilization STM Lt/Rt upper trap  bilat cervical parapsinals and  suboccipitals            Trigger Point Dry Needling - 08/29/19 0001    Consent Given? Yes    Education Handout Provided Previously provided    Muscles Treated Head and Neck Cervical multifidi;Suboccipitals    Upper Trapezius Response Twitch reponse elicited;Palpable increased muscle length   Lt    Suboccipitals Response Twitch response elicited;Palpable increased muscle length    Cervical multifidi Response Twitch reponse elicited;Palpable increased muscle length                  PT Short Term Goals - 08/22/19 0936      PT SHORT TERM GOAL #1   Title be independent in initial HEP    Status Achieved      PT SHORT TERM GOAL #2   Title report a 30% reduction in the frequency and intensity of headaches    Status Achieved      PT SHORT TERM GOAL #3   Title demonstrate 60 degrees of bil cervical a/ROM rotation to improve safety with driving    Status On-going      PT SHORT TERM GOAL #4   Title report < or =  to 6/10 max Rt knee pain to improve symmetry with gait    Status Achieved             PT Long Term Goals - 08/29/19 4599      PT LONG TERM GOAL #1   Title be independent in advanced HEP    Status Achieved      PT LONG TERM GOAL #2   Title reduce FOTO to < or = to 41% limitation    Baseline 37%    Status Achieved      PT LONG TERM GOAL #3   Title demonstrate bil cervical rotation to > or = to 75 degrees A/ROM to improve safety with driving    Baseline Lt 70 deg; Rt 65 deg    Status Partially Met      PT LONG TERM GOAL #4   Title report < or = to 3/10 Rt knee pain with standing and walking    Baseline 3/10    Status Achieved      PT LONG TERM GOAL #5   Title report a 70% reduction in the frequency and intensity of headaches    Baseline 70% better    Status Achieved      PT LONG TERM GOAL #6   Title report a 70% reduction in neck/thoracic/shoulder pain with ADLs and self-care    Baseline 60%    Status Achieved                 Plan -  08/29/19 1109    Clinical Impression Statement Pt did well with exercises today.  Pt feels good with HEP at this time . She has met most of her goals and is doing well with pain management.  Today's session focused on dry needling and STM for improved cervical ROM and review and adding to final HEP. Dischrge with HEP today    PT Treatment/Interventions ADLs/Self Care Home Management;Cryotherapy;Electrical Stimulation;Moist Heat;Traction;Gait training;Stair training;Functional mobility training;Therapeutic activities;Therapeutic exercise;Neuromuscular re-education;Manual techniques;Patient/family education;Passive range of motion;Dry needling;Taping    PT Next Visit Plan d/c today           Patient will benefit from skilled therapeutic intervention in order to improve the following deficits and impairments:  Abnormal gait, Decreased activity tolerance, Decreased strength, Improper body mechanics, Impaired flexibility, Pain, Impaired UE functional use, Increased muscle spasms, Decreased endurance, Difficulty walking, Decreased range of motion  Visit Diagnosis: Chronic pain of right knee  Chronic left shoulder pain  Chronic right shoulder pain  Pain in thoracic spine  Cervicalgia  Cramp and spasm     Problem List Patient Active Problem List   Diagnosis Date Noted   Essential hypertension 09/28/2006   PROLAPSE, UTEROVAGINAL NOS 09/28/2006   KNEE PAIN, BILATERAL 09/28/2006   FIBROIDS, UTERUS 09/24/2006   Hyperlipidemia 09/24/2006    Jule Ser, PT 08/29/2019, 11:44 AM  [facility] [address] [place], [place], [address] Phone: [phone]   Fax:  [phone]  Name: Heather Gibson MRN: [id number] Date of Birth: August 19, 1950  PHYSICAL THERAPY DISCHARGE SUMMARY  Visits from Start of Care: 6  Current functional level related to goals / functional outcomes: see above goals Remaining  deficits: See above   Education / Equipment: HEP  Plan: Patient agrees to discharge.  Patient goals were not met. Patient is being discharged due to meeting the stated rehab goals.  ?????

## 2019-09-04 ENCOUNTER — Other Ambulatory Visit: Payer: Self-pay | Admitting: Adult Health

## 2019-09-04 DIAGNOSIS — T148XXA Other injury of unspecified body region, initial encounter: Secondary | ICD-10-CM

## 2019-09-12 ENCOUNTER — Other Ambulatory Visit: Payer: Self-pay

## 2019-09-13 ENCOUNTER — Ambulatory Visit (INDEPENDENT_AMBULATORY_CARE_PROVIDER_SITE_OTHER): Payer: Medicare HMO | Admitting: Adult Health

## 2019-09-13 ENCOUNTER — Encounter: Payer: Self-pay | Admitting: Adult Health

## 2019-09-13 VITALS — BP 144/82 | HR 72 | Temp 98.2°F | Wt 223.8 lb

## 2019-09-13 DIAGNOSIS — M25552 Pain in left hip: Secondary | ICD-10-CM | POA: Diagnosis not present

## 2019-09-13 MED ORDER — TRAMADOL HCL 50 MG PO TABS: 50.0000 mg | ORAL_TABLET | Freq: Every day | ORAL | 0 refills | Status: AC

## 2019-09-13 NOTE — Progress Notes (Signed)
Subjective:    Patient ID: Heather Gibson, female    DOB: 08-Sep-1950, 69 y.o.   MRN: [id number]  HPI  69 year old female who  has a past medical history of Arthritis, Constipation, GERD (gastroesophageal reflux disease), Hyperlipidemia, and Hypertension.  She presents to the office today for an acute issue of " sharp pulling" left sided hip pain. Her pain started yesterday after she got out of work. She denies trauma or aggravating factor. Pain is worse with changing positions such as sitting to standing.   She has used Aleve and Tylenol within the last 24 hours and reports that this did not help much.    Review of Systems See HPI   Past Medical History:  Diagnosis Date   Arthritis    Constipation    GERD (gastroesophageal reflux disease)    Hyperlipidemia    Hypertension     Social History   Socioeconomic History   Marital status: Married    Spouse name: Not on file   Number of children: Not on file   Years of education: Not on file   Highest education level: Not on file  Occupational History   Not on file  Tobacco Use   Smoking status: Never Smoker   Smokeless tobacco: Never Used  Vaping Use   Vaping Use: Never used  Substance and Sexual Activity   Alcohol use: No   Drug use: No   Sexual activity: Not Currently    Birth control/protection: Post-menopausal  Other Topics Concern   Not on file  Social History Narrative   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   Married for 14 years   Three Children ( all live locally)    Social Determinants of Radio broadcast assistant Strain:    Difficulty of Paying Living Expenses: Not on file  Food Insecurity:    Worried About Charity fundraiser in the Last Year: Not on file   [place] in the Last Year: Not on file  Transportation Needs:    Film/video editor (Medical): Not on file   Lack of Transportation (Non-Medical): Not on file  Physical  Activity:    Days of Exercise per Week: Not on file   Minutes of Exercise per Session: Not on file  Stress:    Feeling of Stress : Not on file  Social Connections:    Frequency of Communication with Friends and Family: Not on file   Frequency of Social Gatherings with Friends and Family: Not on file   Attends Religious Services: Not on file   Active Member of Clubs or Organizations: Not on file   Attends Archivist Meetings: Not on file   Marital Status: Not on file  Intimate Partner Violence:    Fear of Current or Ex-Partner: Not on file   Emotionally Abused: Not on file   Physically Abused: Not on file   Sexually Abused: Not on file    Past Surgical History:  Procedure Laterality Date   ABDOMINAL HYSTERECTOMY     COLONOSCOPY  2017   COLPORRHAPHY     PROLAPSED UTERINE FIBROID LIGATION     x2   SALPINGOOPHORECTOMY Right     Family History  Problem Relation Age of Onset   Breast cancer Mother    Hypertension Mother    Schizophrenia Mother    Hypertension Maternal Grandmother    Diabetes Maternal Grandmother    Breast cancer Maternal Grandmother        ?  Colon cancer Neg Hx    Colon polyps Neg Hx    Esophageal cancer Neg Hx    Rectal cancer Neg Hx    Stomach cancer Neg Hx     Allergies  Allergen Reactions   Crestor [Rosuvastatin] Other (See Comments)    Myalgia    Eggs Or Egg-Derived Products     Years ago dx- eats egg now without any problems   Latex Swelling   Milk-Related Compounds     Cannot have large amt of milk products   Peanut-Containing Drug Products     Cannot have large amts of   Shellfish Allergy Itching    Current Outpatient Medications on File Prior to Visit  Medication Sig Dispense Refill   acetaminophen (TYLENOL) 325 MG tablet Take 650 mg by mouth every 6 (six) hours as needed.     amLODipine (NORVASC) 10 MG tablet Take 1 tablet (10 mg total) by mouth daily. 90 tablet 3   diclofenac sodium  (VOLTAREN) 1 % GEL Apply 2 g topically 4 (four) times daily as needed. 100 g 3   lisinopril-hydrochlorothiazide (ZESTORETIC) 20-25 MG tablet Take 1 tablet by mouth daily. 90 tablet 3   omeprazole (PRILOSEC) 20 MG capsule TAKE 1 CAPSULE(20 MG) BY MOUTH DAILY 90 capsule 2   cyclobenzaprine (FLEXERIL) 10 MG tablet TAKE 1 TABLET(10 MG) BY MOUTH THREE TIMES DAILY AS NEEDED FOR MUSCLE SPASMS (Patient not taking: Reported on 09/13/2019) 30 tablet 0   No current facility-administered medications on file prior to visit.    BP (!) 144/82 (BP Location: Left Arm, Patient Position: Sitting, Cuff Size: Large)    Pulse 72    Temp 98.2 F (36.8 C) (Oral)    Wt 223 lb 12.8 oz (101.5 kg)    SpO2 96%    BMI 35.05 kg/m       Objective:   Physical Exam Vitals and nursing note reviewed.  Constitutional:      Appearance: Normal appearance.  Musculoskeletal:        General: Tenderness present. No swelling, deformity or signs of injury.     Right hip: Normal.     Left hip: Bony tenderness present. No deformity, tenderness or crepitus. Normal range of motion. Normal strength.     Comments: No pain with straight leg raise, knee to chest or external rotation. She had mild discomfort with internal knee rotation as well as changing positions from sitting to standing.   Skin:    General: Skin is warm and dry.     Capillary Refill: Capillary refill takes less than 2 seconds.  Neurological:     General: No focal deficit present.     Mental Status: She is alert and oriented to person, place, and time.       Assessment & Plan:  1. Left hip pain - Possible soft tissue strain. Cannot r/o labral tear. Will start with xray to look for bony abnormality. Consider MRI in the future or referral to sports medicine  - DG Hip Unilat W OR W/O Pelvis 2-3 Views Left; Future - traMADol (ULTRAM) 50 MG tablet; Take 1 tablet (50 mg total) by mouth at bedtime for 5 days.  Dispense: 10 tablet; Refill: 0   Dorothyann Peng, NP

## 2019-09-14 ENCOUNTER — Ambulatory Visit (INDEPENDENT_AMBULATORY_CARE_PROVIDER_SITE_OTHER)
Admission: RE | Admit: 2019-09-14 | Discharge: 2019-09-14 | Disposition: A | Discharge status: Home / Self Care | Payer: Medicare HMO | Source: Ambulatory Visit | Attending: Adult Health | Admitting: Adult Health

## 2019-09-14 ENCOUNTER — Other Ambulatory Visit: Payer: Self-pay

## 2019-09-14 DIAGNOSIS — M25552 Pain in left hip: Secondary | ICD-10-CM | POA: Diagnosis not present

## 2019-09-19 ENCOUNTER — Telehealth: Payer: Self-pay | Admitting: Adult Health

## 2019-09-19 NOTE — Telephone Encounter (Signed)
Spoke to patient and informed her of her x-ray of the left hip which was negative.  She does feel as though she is improving and has less pain in her left hip at this point in time.  She will continue to use Tylenol and heating pad as well as do gentle stretching exercises.  She was advised to keep me updated in the next week or 2 if she continues to have pain

## 2019-10-06 ENCOUNTER — Other Ambulatory Visit: Payer: Self-pay | Admitting: Adult Health

## 2019-10-06 DIAGNOSIS — T148XXA Other injury of unspecified body region, initial encounter: Secondary | ICD-10-CM

## 2019-10-09 MED ORDER — CYCLOBENZAPRINE HCL 10 MG PO TABS: ORAL_TABLET | ORAL | 0 refills | Status: DC

## 2019-10-09 NOTE — Addendum Note (Signed)
Addended by: Nathanial Millman E on: 10/09/2019 12:40 PM   Modules accepted: Orders

## 2019-10-20 ENCOUNTER — Ambulatory Visit (INDEPENDENT_AMBULATORY_CARE_PROVIDER_SITE_OTHER): Payer: Medicare HMO | Admitting: Internal Medicine

## 2019-10-20 ENCOUNTER — Other Ambulatory Visit: Payer: Self-pay

## 2019-10-20 ENCOUNTER — Encounter: Payer: Self-pay | Admitting: Internal Medicine

## 2019-10-20 VITALS — BP 136/80 | HR 91 | Ht 66.5 in | Wt 224.0 lb

## 2019-10-20 DIAGNOSIS — I10 Essential (primary) hypertension: Secondary | ICD-10-CM

## 2019-10-20 DIAGNOSIS — M791 Myalgia, unspecified site: Secondary | ICD-10-CM | POA: Diagnosis not present

## 2019-10-20 DIAGNOSIS — E785 Hyperlipidemia, unspecified: Secondary | ICD-10-CM | POA: Diagnosis not present

## 2019-10-20 DIAGNOSIS — T466X5A Adverse effect of antihyperlipidemic and antiarteriosclerotic drugs, initial encounter: Secondary | ICD-10-CM | POA: Diagnosis not present

## 2019-10-20 DIAGNOSIS — E782 Mixed hyperlipidemia: Secondary | ICD-10-CM

## 2019-10-20 MED ORDER — ATORVASTATIN CALCIUM 40 MG PO TABS: 40.0000 mg | ORAL_TABLET | Freq: Every day | ORAL | 3 refills | Status: DC

## 2019-10-20 NOTE — Progress Notes (Signed)
LIPID CLINIC CONSULT NOTE  Chief Complaint:  Manage dyslipidemia  Primary Care Physician: Dorothyann Peng, NP  Primary Cardiologist:  No primary care provider on file.  HPI:  Heather Gibson is a 69 y.o. female who is being seen today for the evaluation of dyslipidemia at the request of Dorothyann Peng, NP. This is a pleasant 68 year old female kindly referred for management of dyslipidemia.  She has no family history of heart disease in her mother died of pneumonia at 25 and her father had no heart disease that she is aware of.  There is hypertension in the family and she is well treated with hypertension.  She has a history of longstanding dyslipidemia with LDL cholesterol times over 200 although most simply her LDL was 179, total cholesterol 269, HDL of 59 and triglycerides of 160.  In the past she has been tried on statins including rosuvastatin which caused some myalgias and therefore she discontinued it.  She is currently on no therapies.  She reports a varied but mostly [place] diet with some fried foods although does like vegetables and tries to avoid certain fats and uses all of oil.  Overall she is pretty sedentary.  She has no known history of coronary disease.  PMHx:  Past Medical History:  Diagnosis Date   Arthritis    Constipation    GERD (gastroesophageal reflux disease)    Hyperlipidemia    Hypertension     Past Surgical History:  Procedure Laterality Date   ABDOMINAL HYSTERECTOMY     COLONOSCOPY  2017   COLPORRHAPHY     PROLAPSED UTERINE FIBROID LIGATION     x2   SALPINGOOPHORECTOMY Right     FAMHx:  Family History  Problem Relation Age of Onset   Breast cancer Mother    Hypertension Mother    Schizophrenia Mother    Hypertension Maternal Grandmother    Diabetes Maternal Grandmother    Breast cancer Maternal Grandmother        ?   Colon cancer Neg Hx    Colon polyps Neg Hx    Esophageal cancer Neg Hx    Rectal cancer Neg  Hx    Stomach cancer Neg Hx     SOCHx:   reports that she has never smoked. She has never used smokeless tobacco. She reports that she does not drink alcohol and does not use drugs.  ALLERGIES:  Allergies  Allergen Reactions   Crestor [Rosuvastatin] Other (See Comments)    Myalgia    Eggs Or Egg-Derived Products     Years ago dx- eats egg now without any problems   Latex Swelling   Milk-Related Compounds     Cannot have large amt of milk products   Peanut-Containing Drug Products     Cannot have large amts of   Shellfish Allergy Itching    ROS: Pertinent items noted in HPI and remainder of comprehensive ROS otherwise negative.  HOME MEDS: Current Outpatient Medications on File Prior to Visit  Medication Sig Dispense Refill   acetaminophen (TYLENOL) 325 MG tablet Take 650 mg by mouth every 6 (six) hours as needed.     amLODipine (NORVASC) 10 MG tablet Take 1 tablet (10 mg total) by mouth daily. 90 tablet 3   diclofenac sodium (VOLTAREN) 1 % GEL Apply 2 g topically 4 (four) times daily as needed. 100 g 3   lisinopril-hydrochlorothiazide (ZESTORETIC) 20-25 MG tablet Take 1 tablet by mouth daily. 90 tablet 3   omeprazole (PRILOSEC) 20 MG  capsule TAKE 1 CAPSULE(20 MG) BY MOUTH DAILY 90 capsule 2   cyclobenzaprine (FLEXERIL) 10 MG tablet TAKE 1 TABLET(10 MG) BY MOUTH THREE TIMES DAILY AS NEEDED FOR MUSCLE SPASMS (Patient not taking: Reported on 10/20/2019) 30 tablet 0   No current facility-administered medications on file prior to visit.    LABS/IMAGING: No results found for this or any previous visit (from the past 48 hour(s)). No results found.  LIPID PANEL:    Component Value Date/Time   CHOL 269 (H) 05/16/2019 0942   TRIG 160.0 (H) 05/16/2019 0942   HDL 58.70 05/16/2019 0942   CHOLHDL 5 05/16/2019 0942   VLDL 32.0 05/16/2019 0942   LDLCALC 179 (H) 05/16/2019 0942    WEIGHTS: Wt Readings from Last 3 Encounters:  10/20/19 224 lb (101.6 kg)  09/13/19  223 lb 12.8 oz (101.5 kg)  08/03/19 (!) 223 lb (101.2 kg)    VITALS: BP 136/80    Pulse 91    Ht 5' 6.5" (1.689 m)    Wt 224 lb (101.6 kg)    SpO2 96%    BMI 35.61 kg/m   EXAM: General appearance: alert and no distress Neck: no carotid bruit, no JVD and thyroid not enlarged, symmetric, no tenderness/mass/nodules Lungs: clear to auscultation bilaterally Heart: regular rate and rhythm Abdomen: soft, non-tender; bowel sounds normal; no masses,  no organomegaly Extremities: extremities normal, atraumatic, no cyanosis or edema Pulses: 2+ and symmetric Skin: Skin color, texture, turgor normal. No rashes or lesions Neurologic: Grossly normal Psych: Pleasant  EKG: Deferred  ASSESSMENT: 1. Mixed dyslipidemia,?  Familial hyperlipidemia 2. Hypertension-controlled 3. Myalgia to Crestor  PLAN: 1.   [name] has a mixed dyslipidemia and questionable familial hyperlipidemia with high LDL cholesterol although this may be primarily dietary.  We provided additional information and recommendations for lowering saturated fats and cholesterol in her diet.  She will likely need therapy and I would recommend a atorvastatin 40 mg at night.  I have also recommended calcium scoring to further risk stratify her as currently I would put her at low to intermediate risk for hypertension and age.  Hopefully this will help [place] further define her LDL targets.  Thanks for the kind referral.  Plan follow-up with me and repeat lipids in about 3 months.  Pixie Casino, MD, [facility], [place] Director of the Advanced Lipid Disorders &  Cardiovascular Risk Reduction Clinic Diplomate of the American Board of Clinical Lipidology Attending Cardiologist  Direct Dial: [phone]   Fax: [phone]  Website:  [email or web address] 10/20/2019, 12:14 PM

## 2019-10-20 NOTE — Patient Instructions (Signed)
Medication Instructions:  START atorvastatin once daily in the evening (for cholesterol) -- avoid grapefruit!  *If you need a refill on your cardiac medications before your next appointment, please call your pharmacy*   Lab Work: FASTING lipid panel in 3-4 months - to be completed before your next visit with Dr. Debara Pickett   If you have labs (blood work) drawn today and your tests are completely normal, you will receive your results only by: Marland Kitchen MyChart Message (if you have MyChart) OR . A paper copy in the mail If you have any lab test that is abnormal or we need to change your treatment, we will call you to review the results.   Testing/Procedures: Dr. Debara Pickett has ordered a CT coronary calcium score. This test is done at [address]. This is $150 out of pocket.   Coronary CalciumScan A coronary calcium scan is an imaging test used to look for deposits of calcium and other fatty materials (plaques) in the inner lining of the blood vessels of the heart (coronary arteries). These deposits of calcium and plaques can partly clog and narrow the coronary arteries without producing any symptoms or warning signs. This puts a person at risk for a heart attack. This test can detect these deposits before symptoms develop. Tell a health care provider about:  Any allergies you have.  All medicines you are taking, including vitamins, herbs, eye drops, creams, and over-the-counter medicines.  Any problems you or family members have had with anesthetic medicines.  Any blood disorders you have.  Any surgeries you have had.  Any medical conditions you have.  Whether you are pregnant or may be pregnant. What are the risks? Generally, this is a safe procedure. However, problems may occur, including:  Harm to a pregnant woman and her unborn baby. This test involves the use of radiation. Radiation exposure can be dangerous to a pregnant woman and her unborn baby. If you are pregnant,  you generally should not have this procedure done.  Slight increase in the risk of cancer. This is because of the radiation involved in the test. What happens before the procedure? No preparation is needed for this procedure. What happens during the procedure?  You will undress and remove any jewelry around your neck or chest.  You will put on a hospital gown.  Sticky electrodes will be placed on your chest. The electrodes will be connected to an electrocardiogram (ECG) machine to record a tracing of the electrical activity of your heart.  A CT scanner will take pictures of your heart. During this time, you will be asked to lie still and hold your breath for 2-3 seconds while a picture of your heart is being taken. The procedure may vary among health care providers and hospitals. What happens after the procedure?  You can get dressed.  You can return to your normal activities.  It is up to you to get the results of your test. Ask your health care provider, or the department that is doing the test, when your results will be ready. Summary  A coronary calcium scan is an imaging test used to look for deposits of calcium and other fatty materials (plaques) in the inner lining of the blood vessels of the heart (coronary arteries).  Generally, this is a safe procedure. Tell your health care provider if you are pregnant or may be pregnant.  No preparation is needed for this procedure.  A CT scanner will take pictures of your heart.  You can return to your normal activities after the scan is done. This information is not intended to replace advice given to you by your health care provider. Make sure you discuss any questions you have with your health care provider. Document Released: 06/20/2007 Document Revised: 11/11/2015 Document Reviewed: 11/11/2015 Elsevier Interactive Patient Education  2017 [place]: At [facility], you and your health needs are our  priority.  As part of our continuing mission to provide you with exceptional heart care, we have created designated Provider Care Teams.  These Care Teams include your primary Cardiologist (physician) and Advanced Practice Providers (APPs -  Physician Assistants and Nurse Practitioners) who all work together to provide you with the care you need, when you need it.  We recommend signing up for the patient portal called "MyChart".  Sign up information is provided on this After Visit Summary.  MyChart is used to connect with patients for Virtual Visits (Telemedicine).  Patients are able to view lab/test results, encounter notes, upcoming appointments, etc.  Non-urgent messages can be sent to your provider as well.   To learn more about what you can do with MyChart, go to [email or web address].    Your next appointment:   3-4 month(s) - lipid clinic  The format for your next appointment:   In Person  Provider:   K. Mali Hilty, MD   Other Instructions

## 2019-10-25 ENCOUNTER — Ambulatory Visit (INDEPENDENT_AMBULATORY_CARE_PROVIDER_SITE_OTHER): Payer: Medicare HMO

## 2019-10-25 ENCOUNTER — Ambulatory Visit (INDEPENDENT_AMBULATORY_CARE_PROVIDER_SITE_OTHER): Payer: Medicare HMO | Admitting: Adult Health

## 2019-10-25 ENCOUNTER — Encounter: Payer: Self-pay | Admitting: Adult Health

## 2019-10-25 ENCOUNTER — Other Ambulatory Visit: Payer: Self-pay

## 2019-10-25 VITALS — BP 138/70 | HR 78 | Temp 98.4°F | Ht 66.5 in | Wt 225.0 lb

## 2019-10-25 DIAGNOSIS — G8929 Other chronic pain: Secondary | ICD-10-CM | POA: Diagnosis not present

## 2019-10-25 DIAGNOSIS — Z23 Encounter for immunization: Secondary | ICD-10-CM

## 2019-10-25 DIAGNOSIS — Z1239 Encounter for other screening for malignant neoplasm of breast: Secondary | ICD-10-CM | POA: Diagnosis not present

## 2019-10-25 DIAGNOSIS — M25561 Pain in right knee: Secondary | ICD-10-CM

## 2019-10-25 DIAGNOSIS — M25761 Osteophyte, right knee: Secondary | ICD-10-CM | POA: Diagnosis not present

## 2019-10-25 DIAGNOSIS — M1711 Unilateral primary osteoarthritis, right knee: Secondary | ICD-10-CM | POA: Diagnosis not present

## 2019-10-25 MED ORDER — ZOSTER VAC RECOMB ADJUVANTED 50 MCG/0.5ML IM SUSR: 0.5000 mL | Freq: Once | INTRAMUSCULAR | 1 refills | Status: AC

## 2019-10-25 NOTE — Progress Notes (Signed)
Acute Office Visit  Subjective:    Patient ID: Heather Gibson, female    DOB: 18-Aug-1950, 69 y.o.   MRN: [id number]  No chief complaint on file.   HPI Patient is in today for worsening chronic pain in the right knee.  She has gone through physical therapy and felt as though this helped but she continues to have chronic right knee pain that seems to be getting slightly worse.  Pain is worse after she sits for an extended period of time while is with ambulation.  Last x-ray of the right knee was in November 2019 which showed mild tricompartmental degenerative changes of the knee.  She does not want to have a knee replacement but is open to nonsurgical options.  She is using Voltaren gel as well as Motrin Tylenol which helped dull the pain but did not relieve it.  Have prescribed her tramadol in the past but this caused itching throughout her body    Past Medical History:  Diagnosis Date  . Arthritis   . Constipation   . GERD (gastroesophageal reflux disease)   . Hyperlipidemia   . Hypertension     Past Surgical History:  Procedure Laterality Date  . ABDOMINAL HYSTERECTOMY    . COLONOSCOPY  2017  . COLPORRHAPHY    . PROLAPSED UTERINE FIBROID LIGATION     x2  . SALPINGOOPHORECTOMY Right     Family History  Problem Relation Age of Onset  . Breast cancer Mother   . Hypertension Mother   . Schizophrenia Mother   . Hypertension Maternal Grandmother   . Diabetes Maternal Grandmother   . Breast cancer Maternal Grandmother        ?  . Colon cancer Neg Hx   . Colon polyps Neg Hx   . Esophageal cancer Neg Hx   . Rectal cancer Neg Hx   . Stomach cancer Neg Hx     Social History   Socioeconomic History  . Marital status: Married    Spouse name: Not on file  . Number of children: Not on file  . Years of education: Not on file  . Highest education level: Not on file  Occupational History  . Not on file  Tobacco Use  . Smoking status: Never Smoker  .  Smokeless tobacco: Never Used  Vaping Use  . Vaping Use: Never used  Substance and Sexual Activity  . Alcohol use: No  . Drug use: No  . Sexual activity: Not Currently    Birth control/protection: Post-menopausal  Other Topics Concern  . 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 Difficulty of Paying Living Expenses: Not on file  Food Insecurity:   . Worried About Charity fundraiser in the Last Year: Not on file  . Ran Out of Food in the Last Year: Not on file  Transportation Needs:   . Lack of Transportation (Medical): Not on file  . Lack of Transportation (Non-Medical): Not on file  Physical Activity:   . Days of Exercise per Week: Not on file  . Minutes of Exercise per Session: Not on file  Stress:   . Feeling of Stress : Not on file  Social Connections:   . Frequency of Communication with Friends and Family: Not  on file  . Frequency of Social Gatherings with Friends and Family: Not on file  . Attends Religious Services: Not on file  . Active Member of Clubs or Organizations: Not on file  . Attends Archivist Meetings: Not on file  . Marital Status: Not on file  Intimate Partner Violence:   . Fear of Current or Ex-Partner: Not on file  . Emotionally Abused: Not on file  . Physically Abused: Not on file  . Sexually Abused: Not on file    Outpatient Medications Prior to Visit  Medication Sig Dispense Refill  . acetaminophen (TYLENOL) 325 MG tablet Take 650 mg by mouth every 6 (six) hours as needed.    Marland Kitchen amLODipine (NORVASC) 10 MG tablet Take 1 tablet (10 mg total) by mouth daily. 90 tablet 3  . atorvastatin (LIPITOR) 40 MG tablet Take 1 tablet (40 mg total) by mouth daily. 90 tablet 3  . cyclobenzaprine (FLEXERIL) 10 MG tablet TAKE 1 TABLET(10 MG) BY MOUTH THREE TIMES DAILY  AS NEEDED FOR MUSCLE SPASMS 30 tablet 0  . diclofenac sodium (VOLTAREN) 1 % GEL Apply 2 g topically 4 (four) times daily as needed. 100 g 3  . lisinopril-hydrochlorothiazide (ZESTORETIC) 20-25 MG tablet Take 1 tablet by mouth daily. 90 tablet 3  . omeprazole (PRILOSEC) 20 MG capsule TAKE 1 CAPSULE(20 MG) BY MOUTH DAILY 90 capsule 2   No facility-administered medications prior to visit.    Allergies  Allergen Reactions  . Crestor [Rosuvastatin] Other (See Comments)    Myalgia   . Eggs Or Egg-Derived Products     Years ago dx- eats egg now without any problems  . Latex Swelling  . Milk-Related Compounds     Cannot have large amt of milk products  . Peanut-Containing Drug Products     Cannot have large amts of  . Shellfish Allergy Itching    Review of Systems See HPI   Past Medical History:  Diagnosis Date  . Arthritis   . Constipation   . GERD (gastroesophageal reflux disease)   . Hyperlipidemia   . Hypertension     Social History   Socioeconomic History  . Marital status: Married    Spouse name: Not on file  . Number of children: Not on file  . Years of education: Not on file  . Highest education level: Not on file  Occupational History  . Not on file  Tobacco Use  . Smoking status: Never Smoker  . Smokeless tobacco: Never Used  Vaping Use  . Vaping Use: Never used  Substance and Sexual Activity  . Alcohol use: No  . Drug use: No  . Sexual activity: Not Currently    Birth control/protection: Post-menopausal  Other Topics Concern  . 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 Difficulty of Paying Living Expenses: Not on file  Food Insecurity:   . Worried About Charity fundraiser in the Last Year: Not on file  . Ran Out of Food in the Last Year: Not on file   Transportation Needs:   . Lack of Transportation (Medical): Not on file  . Lack of Transportation (Non-Medical): Not on file  Physical Activity:   . Days of Exercise per Week: Not on file  . Minutes  of Exercise per Session: Not on file  Stress:   . Feeling of Stress : Not on file  Social Connections:   . Frequency of Communication with Friends and Family: Not on file  . Frequency of Social Gatherings with Friends and Family: Not on file  . Attends Religious Services: Not on file  . Active Member of Clubs or Organizations: Not on file  . Attends Archivist Meetings: Not on file  . Marital Status: Not on file  Intimate Partner Violence:   . Fear of Current or Ex-Partner: Not on file  . Emotionally Abused: Not on file  . Physically Abused: Not on file  . Sexually Abused: Not on file    Past Surgical History:  Procedure Laterality Date  . ABDOMINAL HYSTERECTOMY    . COLONOSCOPY  2017  . COLPORRHAPHY    . PROLAPSED UTERINE FIBROID LIGATION     x2  . SALPINGOOPHORECTOMY Right     Family History  Problem Relation Age of Onset  . Breast cancer Mother   . Hypertension Mother   . Schizophrenia Mother   . Hypertension Maternal Grandmother   . Diabetes Maternal Grandmother   . Breast cancer Maternal Grandmother        ?  . Colon cancer Neg Hx   . Colon polyps Neg Hx   . Esophageal cancer Neg Hx   . Rectal cancer Neg Hx   . Stomach cancer Neg Hx     Allergies  Allergen Reactions  . Crestor [Rosuvastatin] Other (See Comments)    Myalgia   . Eggs Or Egg-Derived Products     Years ago dx- eats egg now without any problems  . Latex Swelling  . Milk-Related Compounds     Cannot have large amt of milk products  . Peanut-Containing Drug Products     Cannot have large amts of  . Shellfish Allergy Itching    Current Outpatient Medications on File Prior to Visit  Medication Sig Dispense Refill  . acetaminophen (TYLENOL) 325 MG tablet Take 650 mg by mouth every 6  (six) hours as needed.    Marland Kitchen amLODipine (NORVASC) 10 MG tablet Take 1 tablet (10 mg total) by mouth daily. 90 tablet 3  . atorvastatin (LIPITOR) 40 MG tablet Take 1 tablet (40 mg total) by mouth daily. 90 tablet 3  . cyclobenzaprine (FLEXERIL) 10 MG tablet TAKE 1 TABLET(10 MG) BY MOUTH THREE TIMES DAILY AS NEEDED FOR MUSCLE SPASMS 30 tablet 0  . diclofenac sodium (VOLTAREN) 1 % GEL Apply 2 g topically 4 (four) times daily as needed. 100 g 3  . lisinopril-hydrochlorothiazide (ZESTORETIC) 20-25 MG tablet Take 1 tablet by mouth daily. 90 tablet 3  . omeprazole (PRILOSEC) 20 MG capsule TAKE 1 CAPSULE(20 MG) BY MOUTH DAILY 90 capsule 2   No current facility-administered medications on file prior to visit.    BP (!) 158/70 (BP Location: Left Arm, Patient Position: Sitting, Cuff Size: Large)   Pulse 78   Temp 98.4 F (36.9 C) (Oral)   Ht 5' 6.5" (1.689 m)   Wt 225 lb (102.1 kg)   SpO2 95%   BMI 35.77 kg/m       Objective:    Physical Exam Vitals and nursing note reviewed.  Constitutional:      Appearance: Normal appearance. She is obese.  Cardiovascular:     Rate and Rhythm: Normal rate and regular rhythm.     Pulses: Normal pulses.     Heart sounds: Normal heart  sounds.  Pulmonary:     Effort: Pulmonary effort is normal.     Breath sounds: Normal breath sounds.  Musculoskeletal:        General: Swelling and tenderness present.     Comments: Her right knee is slightly swollen but does not feel as an effusion.  She has tenderness throughout the right knee with palpation.  Skin:    General: Skin is warm and dry.  Neurological:     Mental Status: She is alert.  Psychiatric:        Mood and Affect: Mood normal.        Behavior: Behavior normal.        Thought Content: Thought content normal.        Judgment: Judgment normal.     BP (!) 158/70 (BP Location: Left Arm, Patient Position: Sitting, Cuff Size: Large)   Pulse 78   Temp 98.4 F (36.9 C) (Oral)   Ht 5' 6.5" (1.689 m)    Wt 225 lb (102.1 kg)   SpO2 95%   BMI 35.77 kg/m  Wt Readings from Last 3 Encounters:  10/25/19 225 lb (102.1 kg)  10/20/19 224 lb (101.6 kg)  09/13/19 223 lb 12.8 oz (101.5 kg)    Health Maintenance Due  Topic Date Due  . PNA vac Low Risk Adult (2 of 2 - PPSV23) 06/17/2018  . MAMMOGRAM  05/28/2019  . INFLUENZA VACCINE  08/06/2019    There are no preventive care reminders to display for this patient.   Lab Results  Component Value Date   TSH 0.51 03/15/2019   Lab Results  Component Value Date   WBC 8.0 03/15/2019   HGB 13.2 03/15/2019   HCT 39.3 03/15/2019   MCV 89.1 03/15/2019   PLT 426.0 (H) 03/15/2019   Lab Results  Component Value Date   NA 139 03/15/2019   K 3.7 03/15/2019   CO2 31 03/15/2019   GLUCOSE 94 03/15/2019   BUN 14 03/15/2019   CREATININE 0.77 03/15/2019   BILITOT 0.7 05/16/2019   ALKPHOS 81 05/16/2019   AST 17 05/16/2019   ALT 14 05/16/2019   PROT 7.4 05/16/2019   ALBUMIN 4.3 05/16/2019   CALCIUM 9.7 03/15/2019   GFR 90.37 03/15/2019   Lab Results  Component Value Date   CHOL 269 (H) 05/16/2019   Lab Results  Component Value Date   HDL 58.70 05/16/2019   Lab Results  Component Value Date   LDLCALC 179 (H) 05/16/2019   Lab Results  Component Value Date   TRIG 160.0 (H) 05/16/2019   Lab Results  Component Value Date   CHOLHDL 5 05/16/2019   No results found for: HGBA1C     Assessment & Plan:   1. Chronic pain of right knee -Update x-ray of the knee today.  Referral to orthopedics for nonsurgical options due to osteoarthritis.  She can continue to use her over-the-counter modalities to help relieve the pain. - DG Knee 1-2 Views Right; Future - AMB referral to orthopedics  2. Encounter for screening for malignant neoplasm of breast, unspecified screening modality  - MM Digital Screening; Future  3. Need for influenza vaccination  - Flu Vaccine QUAD High Dose(Fluad)  4. Need for pneumococcal vaccination  -  Pneumococcal polysaccharide vaccine 23-valent greater than or equal to 2yo subcutaneous/IM  5. Need for shingles vaccine  - Zoster Vaccine Adjuvanted [facility]) injection; Inject 0.5 mLs into the muscle once for 1 dose.  Dispense: 0.5 mL; Refill: 1   No  orders of the defined types were placed in this encounter.    Dorothyann Peng, NP

## 2019-10-25 NOTE — Patient Instructions (Signed)
It was great seeing you today   All that pain is coming from the arthritis in your knee. I am going to update the xray today   I have also referred you to Orthopedics to see if there are any non surgical options for you

## 2019-10-26 ENCOUNTER — Telehealth: Payer: Self-pay

## 2019-10-26 NOTE — Telephone Encounter (Signed)
See results note. 

## 2019-10-26 NOTE — Telephone Encounter (Signed)
Patient called in wanting to know if the result to her X Ray were back and letting Dr and nurse know if they were to give her a call with the results   Please advise

## 2019-10-27 ENCOUNTER — Ambulatory Visit (INDEPENDENT_AMBULATORY_CARE_PROVIDER_SITE_OTHER): Payer: Medicare HMO

## 2019-10-27 DIAGNOSIS — Z0000 Encounter for general adult medical examination without abnormal findings: Secondary | ICD-10-CM | POA: Diagnosis not present

## 2019-10-27 NOTE — Patient Instructions (Signed)
[name] , Thank you for taking time to come for your Medicare Wellness Visit. I appreciate your ongoing commitment to your health goals. Please review the following plan we discussed and let me know if I can assist you in the future.   Screening recommendations/referrals: Colonoscopy: Up to date, next due 05/01/2022 Mammogram: Currently due, orders placed on 10/25/2019 for the [place] Density: No longer required  Recommended yearly ophthalmology/optometry visit for glaucoma screening and checkup Recommended yearly dental visit for hygiene and checkup  Vaccinations: Influenza vaccine: Up to date, next due fall 2022 Pneumococcal vaccine: Completed series Tdap vaccine: Up to date, next due 04/16/2026 Shingles vaccine: Currently due for Shingrix, you may receive this vaccine at your pharmacy,    Advanced directives: Advance directive discussed with you today. Even though you declined this today please call our office should you change your mind and we can give you the proper paperwork for you to fill out.   Conditions/risks identified: None   Next appointment: 10/28/2020 @ 9:00 am via Telephone with [place] 65 Years and Older, Female Preventive care refers to lifestyle choices and visits with your health care provider that can promote health and wellness. What does preventive care include?  A yearly physical exam. This is also called an annual well check.  Dental exams once or twice a year.  Routine eye exams. Ask your health care provider how often you should have your eyes checked.  Personal lifestyle choices, including:  Daily care of your teeth and gums.  Regular physical activity.  Eating a healthy diet.  Avoiding tobacco and drug use.  Limiting alcohol use.  Practicing safe sex.  Taking low-dose aspirin every day.  Taking vitamin and mineral supplements as recommended by your health care  provider. What happens during an annual well check? The services and screenings done by your health care provider during your annual well check will depend on your age, overall health, lifestyle risk factors, and family history of disease. Counseling  Your health care provider may ask you questions about your:  Alcohol use.  Tobacco use.  Drug use.  Emotional well-being.  Home and relationship well-being.  Sexual activity.  Eating habits.  History of falls.  Memory and ability to understand (cognition).  Work and work Statistician.  Reproductive health. Screening  You may have the following tests or measurements:  Height, weight, and BMI.  Blood pressure.  Lipid and cholesterol levels. These may be checked every 5 years, or more frequently if you are over 46 years old.  Skin check.  Lung cancer screening. You may have this screening every year starting at age 75 if you have a 30-pack-year history of smoking and currently smoke or have quit within the past 15 years.  Fecal occult blood test (FOBT) of the stool. You may have this test every year starting at age 29.  Flexible sigmoidoscopy or colonoscopy. You may have a sigmoidoscopy every 5 years or a colonoscopy every 10 years starting at age 23.  Hepatitis C blood test.  Hepatitis B blood test.  Sexually transmitted disease (STD) testing.  Diabetes screening. This is done by checking your blood sugar (glucose) after you have not eaten for a while (fasting). You may have this done every 1-3 years.  Bone density scan. This is done to screen for osteoporosis. You may have this done starting at age 41.  Mammogram. This may be done every 1-2 years.  Talk to your health care provider about how often you should have regular mammograms. Talk with your health care provider about your test results, treatment options, and if necessary, the need for more tests. Vaccines  Your health care provider may recommend certain  vaccines, such as:  Influenza vaccine. This is recommended every year.  Tetanus, diphtheria, and acellular pertussis (Tdap, Td) vaccine. You may need a Td booster every 10 years.  Zoster vaccine. You may need this after age 23.  Pneumococcal 13-valent conjugate (PCV13) vaccine. One dose is recommended after age 22.  Pneumococcal polysaccharide (PPSV23) vaccine. One dose is recommended after age 70. Talk to your health care provider about which screenings and vaccines you need and how often you need them. This information is not intended to replace advice given to you by your health care provider. Make sure you discuss any questions you have with your health care provider. Document Released: 01/18/2015 Document Revised: 09/11/2015 Document Reviewed: 10/23/2014 Elsevier Interactive Patient Education  2017 [place] Prevention in the Home Falls can cause injuries. They can happen to people of all ages. There are many things you can do to make your home safe and to help prevent falls. What can I do on the outside of my home?  Regularly fix the edges of walkways and driveways and fix any cracks.  Remove anything that might make you trip as you walk through a door, such as a raised step or threshold.  Trim any bushes or trees on the path to your home.  Use bright outdoor lighting.  Clear any walking paths of anything that might make someone trip, such as rocks or tools.  Regularly check to see if handrails are loose or broken. Make sure that both sides of any steps have handrails.  Any raised decks and porches should have guardrails on the edges.  Have any leaves, snow, or ice cleared regularly.  Use sand or salt on walking paths during winter.  Clean up any spills in your garage right away. This includes oil or grease spills. What can I do in the bathroom?  Use night lights.  Install grab bars by the toilet and in the tub and shower. Do not use towel bars as grab  bars.  Use non-skid mats or decals in the tub or shower.  If you need to sit down in the shower, use a plastic, non-slip stool.  Keep the floor dry. Clean up any water that spills on the floor as soon as it happens.  Remove soap buildup in the tub or shower regularly.  Attach bath mats securely with double-sided non-slip rug tape.  Do not have throw rugs and other things on the floor that can make you trip. What can I do in the bedroom?  Use night lights.  Make sure that you have a light by your bed that is easy to reach.  Do not use any sheets or blankets that are too big for your bed. They should not hang down onto the floor.  Have a firm chair that has side arms. You can use this for support while you get dressed.  Do not have throw rugs and other things on the floor that can make you trip. What can I do in the kitchen?  Clean up any spills right away.  Avoid walking on wet floors.  Keep items that you use a lot in easy-to-reach places.  If you need to reach something above you, use a strong step stool  that has a grab bar.  Keep electrical cords out of the way.  Do not use floor polish or wax that makes floors slippery. If you must use wax, use non-skid floor wax.  Do not have throw rugs and other things on the floor that can make you trip. What can I do with my stairs?  Do not leave any items on the stairs.  Make sure that there are handrails on both sides of the stairs and use them. Fix handrails that are broken or loose. Make sure that handrails are as long as the stairways.  Check any carpeting to make sure that it is firmly attached to the stairs. Fix any carpet that is loose or worn.  Avoid having throw rugs at the top or bottom of the stairs. If you do have throw rugs, attach them to the floor with carpet tape.  Make sure that you have a light switch at the top of the stairs and the bottom of the stairs. If you do not have them, ask someone to add them for  you. What else can I do to help prevent falls?  Wear shoes that:  Do not have high heels.  Have rubber bottoms.  Are comfortable and fit you well.  Are closed at the toe. Do not wear sandals.  If you use a stepladder:  Make sure that it is fully opened. Do not climb a closed stepladder.  Make sure that both sides of the stepladder are locked into place.  Ask someone to hold it for you, if possible.  Clearly mark and make sure that you can see:  Any grab bars or handrails.  First and last steps.  Where the edge of each step is.  Use tools that help you move around (mobility aids) if they are needed. These include:  Canes.  Walkers.  Scooters.  Crutches.  Turn on the lights when you go into a dark area. Replace any light bulbs as soon as they burn out.  Set up your furniture so you have a clear path. Avoid moving your furniture around.  If any of your floors are uneven, fix them.  If there are any pets around you, be aware of where they are.  Review your medicines with your doctor. Some medicines can make you feel dizzy. This can increase your chance of falling. Ask your doctor what other things that you can do to help prevent falls. This information is not intended to replace advice given to you by your health care provider. Make sure you discuss any questions you have with your health care provider. Document Released: 10/18/2008 Document Revised: 05/30/2015 Document Reviewed: 01/26/2014 Elsevier Interactive Patient Education  2017 [employer].

## 2019-10-27 NOTE — Progress Notes (Signed)
Subjective:   Heather Gibson is a 69 y.o. female who presents for an Initial Medicare Annual Wellness Visit.  I connected with [name] today by telephone and verified that I am speaking with the correct person using two identifiers. Location patient: home Location provider: work Persons participating in the virtual visit: patient, provider.   I discussed the limitations, risks, security and privacy concerns of performing an evaluation and management service by telephone and the availability of in person appointments. I also discussed with the patient that there may be a patient responsible charge related to this service. The patient expressed understanding and verbally consented to this telephonic visit.    Interactive audio and video telecommunications were attempted between this provider and patient, however failed, due to patient having technical difficulties OR patient did not have access to video capability.  We continued and completed visit with audio only.      Review of Systems    N/A  Cardiac Risk Factors include: advanced age (>32men, >65 women);dyslipidemia;hypertension     Objective:    Today's Vitals   10/27/19 0859  PainSc: 10-Worst pain ever   There is no height or weight on file to calculate BMI.  Advanced Directives 10/27/2019 07/11/2019 04/05/2015  Does Patient Have a Medical Advance Directive? No No No  Would patient like information on creating a medical advance directive? No - Patient declined No - Patient declined -    Current Medications (verified) Outpatient Encounter Medications as of 10/27/2019  Medication Sig  . acetaminophen (TYLENOL) 325 MG tablet Take 650 mg by mouth every 6 (six) hours as needed.  Marland Kitchen amLODipine (NORVASC) 10 MG tablet Take 1 tablet (10 mg total) by mouth daily.  Marland Kitchen atorvastatin (LIPITOR) 40 MG tablet Take 1 tablet (40 mg total) by mouth daily.  . cyclobenzaprine (FLEXERIL) 10 MG tablet TAKE 1 TABLET(10 MG) BY MOUTH  THREE TIMES DAILY AS NEEDED FOR MUSCLE SPASMS  . diclofenac sodium (VOLTAREN) 1 % GEL Apply 2 g topically 4 (four) times daily as needed.  Marland Kitchen lisinopril-hydrochlorothiazide (ZESTORETIC) 20-25 MG tablet Take 1 tablet by mouth daily.  Marland Kitchen omeprazole (PRILOSEC) 20 MG capsule TAKE 1 CAPSULE(20 MG) BY MOUTH DAILY   No facility-administered encounter medications on file as of 10/27/2019.    Allergies (verified) Crestor [rosuvastatin], Eggs or egg-derived products, Latex, Milk-related compounds, Peanut-containing drug products, Tramadol, and Shellfish allergy   History: Past Medical History:  Diagnosis Date  . Arthritis   . Constipation   . GERD (gastroesophageal reflux disease)   . Hyperlipidemia   . Hypertension    Past Surgical History:  Procedure Laterality Date  . ABDOMINAL HYSTERECTOMY    . COLONOSCOPY  2017  . COLPORRHAPHY    . PROLAPSED UTERINE FIBROID LIGATION     x2  . SALPINGOOPHORECTOMY Right    Family History  Problem Relation Age of Onset  . Breast cancer Mother   . Hypertension Mother   . Schizophrenia Mother   . Hypertension Maternal Grandmother   . Diabetes Maternal Grandmother   . Breast cancer Maternal Grandmother        ?  . Colon cancer Neg Hx   . Colon polyps Neg Hx   . Esophageal cancer Neg Hx   . Rectal cancer Neg Hx   . Stomach cancer Neg Hx    Social History   Socioeconomic History  . Marital status: Married    Spouse name: Not on file  . Number of children: Not on file  .  Years of education: Not on file  . Highest education level: Not on file  Occupational History  . Not on file  Tobacco Use  . Smoking status: Never Smoker  . Smokeless tobacco: Never Used  Vaping Use  . Vaping Use: Never used  Substance and Sexual Activity  . Alcohol use: No  . Drug use: No  . Sexual activity: Not Currently    Birth control/protection: Post-menopausal  Other Topics Concern  . 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 Difficulty of Paying Living Expenses: Not hard at all  Food Insecurity: No Food Insecurity  . Worried About Charity fundraiser in the Last Year: Never true  . Ran Out of Food in the Last Year: Never true  Transportation Needs: No Transportation Needs  . Lack of Transportation (Medical): No  . Lack of Transportation (Non-Medical): No  Physical Activity: Insufficiently Active  . Days of Exercise per Week: 7 days  . Minutes of Exercise per Session: 20 min  Stress: No Stress Concern Present  . Feeling of Stress : Not at all  Social Connections: Moderately Integrated  . Frequency of Communication with Friends and Family: More than three times a week  . Frequency of Social Gatherings with Friends and Family: More than three times a week  . Attends Religious Services: More than 4 times per year  . Active Member of Clubs or Organizations: Yes  . Attends Archivist Meetings: More than 4 times per year  . Marital Status: Separated    Tobacco Counseling Counseling given: Not Answered   Clinical Intake:  Pre-visit preparation completed: Yes  Pain : 0-10 Pain Score: 10-Worst pain ever Pain Type: Chronic pain Pain Location: Knee Pain Orientation: Right Pain Descriptors / Indicators: Aching, Other (Comment) (Stiff aching pain) Pain Onset: More than a month ago Pain Frequency: Constant Pain Relieving Factors: Moving around  Pain Relieving Factors: Moving around  Nutritional Risks: None Diabetes: No  How often do you need to have someone help you when you read instructions, pamphlets, or other written materials from your doctor or pharmacy?: 1 - Never What is the last grade level you completed in school?: 12th grade  Diabetic?No  Interpreter Needed?: No  Information entered by ::  [place] of Daily Living In your present state of health, do you have any difficulty performing the following activities: 10/27/2019  Hearing? N  Vision? N  Difficulty concentrating or making decisions? N  Walking or climbing stairs? Y  Comment Has some difficulties climbing stairs due to knee pain has to go slow  Dressing or bathing? N  Doing errands, shopping? N  Preparing Food and eating ? N  Using the Toilet? N  In the past six months, have you accidently leaked urine? N  Do you have problems with loss of bowel control? N  Managing your Medications? N  Managing your Finances? N  Housekeeping or managing your Housekeeping? N  Some recent data might be hidden    Patient Care Team: Dorothyann Peng, NP as PCP - General (Family Medicine)  Indicate any recent Medical Services you may have received from other than [facility] providers in the past year (date may be approximate).     Assessment:  This is a routine wellness examination for [place].  Hearing/Vision screen  Hearing Screening   125Hz  250Hz  500Hz  1000Hz  2000Hz  3000Hz  4000Hz  6000Hz  8000Hz   Right ear:           Left ear:           Vision Screening Comments: Patient states has not had eyes examined but one time in her life   Dietary issues and exercise activities discussed: Current Exercise Habits: Home exercise routine, Type of exercise: walking, Time (Minutes): 20, Frequency (Times/Week): 7, Weekly Exercise (Minutes/Week): 140, Intensity: Mild, Exercise limited by: orthopedic condition(s)  Goals    . Exercise 3x per week (30 min per time)    . Patient Stated     I would like to retire soon       Depression Screen PHQ 2/9 Scores 10/27/2019 09/13/2019 06/16/2017  PHQ - 2 Score 0 0 0  PHQ- 9 Score 0 - -    Fall Risk Fall Risk  10/27/2019 09/13/2019 06/16/2017 06/16/2017  Falls in the past year? 0 0 Yes Yes  Number falls in past yr: 0 0 1 1  Injury with Fall? 0 0 Yes -  Comment - - right shoulder injury   -  Risk for fall due to : No Fall Risks - - -  Follow up Falls evaluation completed;Falls prevention discussed Falls evaluation completed Falls evaluation completed;Falls prevention discussed;Education provided;Follow up appointment -    Any stairs in or around the home? No  If so, are there any without handrails? No  Home free of loose throw rugs in walkways, pet beds, electrical cords, etc? Yes  Adequate lighting in your home to reduce risk of falls? Yes   ASSISTIVE DEVICES UTILIZED TO PREVENT FALLS:  Life alert? No  Use of a cane, walker or w/c? No  Grab bars in the bathroom? Yes  Shower chair or bench in shower? No  Elevated toilet seat or a handicapped toilet? No     Cognitive Function:  Cognitive screening not indicated based on direct observation       Immunizations Immunization History  Administered Date(s) Administered  . Fluad Quad(high Dose 65+) 12/28/2018, 10/25/2019  . Influenza, High Dose Seasonal PF 10/13/2017  . Influenza,inj,Quad PF,6+ Mos 01/20/2016, 10/22/2016  . PFIZER SARS-COV-2 Vaccination 02/25/2019, 03/21/2019  . Pneumococcal Conjugate-13 06/16/2017  . Pneumococcal Polysaccharide-23 08/11/2005, 10/25/2019  . Td 05/09/2003  . Tdap 04/15/2016    TDAP status: Up to date Flu Vaccine status: Up to date Pneumococcal vaccine status: Up to date Covid-19 vaccine status: Completed vaccines  Qualifies for Shingles Vaccine? Yes   Zostavax completed No   Shingrix Completed?: No.    Education has been provided regarding the importance of this vaccine. Patient has been advised to call insurance company to determine out of pocket expense if they have not yet received this vaccine. Advised may also receive vaccine at local pharmacy or Health Dept. Verbalized acceptance and understanding.  Screening Tests Health Maintenance  Topic Date Due  . MAMMOGRAM  05/28/2019  . COLONOSCOPY  05/01/2022  . TETANUS/TDAP  04/16/2026  . INFLUENZA VACCINE  Completed  . DEXA  SCAN  Completed  . COVID-19 Vaccine  Completed  . Hepatitis C Screening  Completed  . PNA vac Low Risk Adult  Completed    Health Maintenance  Health Maintenance Due  Topic Date Due  . MAMMOGRAM  05/28/2019    Colorectal cancer screening: Completed 05/01/2019. Repeat every 3 years Mammogram status: Ordered 10/27/2019. Pt provided with contact  info and advised to call to schedule appt.  Bone Density status: Completed 05/20/2015. Results reflect: Bone density results: NORMAL. Repeat every 0 years.  Lung Cancer Screening: (Low Dose CT Chest recommended if Age 78-80 years, 30 pack-year currently smoking OR have quit w/in 15years.) does not qualify.   Lung Cancer Screening Referral: N/A   Additional Screening:  Hepatitis C Screening: does qualify; Completed 06/16/2017   Vision Screening: Recommended annual ophthalmology exams for early detection of glaucoma and other disorders of the eye. Is the patient up to date with their annual eye exam?  No  Who is the provider or what is the name of the office in which the patient attends annual eye exams? Does not have an eye doctor currently  If pt is not established with a provider, would they like to be referred to a provider to establish care? Yes .   Dental Screening: Recommended annual dental exams for proper oral hygiene  Community Resource Referral / Chronic Care Management: CRR required this visit?  No   CCM required this visit?  No      Plan:     I have personally reviewed and noted the following in the patient's chart:   . Medical and social history . Use of alcohol, tobacco or illicit drugs  . Current medications and supplements . Functional ability and status . Nutritional status . Physical activity . Advanced directives . List of other physicians . Hospitalizations, surgeries, and ER visits in previous 12 months . Vitals . Screenings to include cognitive, depression, and falls . Referrals and appointments  In  addition, I have reviewed and discussed with patient certain preventive protocols, quality metrics, and best practice recommendations. A written personalized care plan for preventive services as well as general preventive health recommendations were provided to patient.     Ofilia Neas, LPN   [id number]   Nurse Notes: None

## 2019-11-06 ENCOUNTER — Inpatient Hospital Stay: Admission: RE | Admit: 2019-11-06 | Payer: Medicare HMO | Source: Ambulatory Visit

## 2019-11-27 ENCOUNTER — Inpatient Hospital Stay: Admission: RE | Admit: 2019-11-27 | Payer: Medicare HMO | Source: Ambulatory Visit

## 2020-01-30 ENCOUNTER — Ambulatory Visit: Payer: Medicare HMO | Attending: Internal Medicine

## 2020-01-30 DIAGNOSIS — Z23 Encounter for immunization: Secondary | ICD-10-CM

## 2020-01-30 NOTE — Progress Notes (Signed)
   Covid-19 Vaccination Clinic  Name:  Heather Gibson    MRN: [id number] DOB: 1950-07-08  01/30/2020  [name] was observed post Covid-19 immunization for 15 minutes without incident. She was provided with Vaccine Information Sheet and instruction to access the V-Safe system.   [name] was instructed to call 911 with any severe reactions post vaccine: Marland Kitchen Difficulty breathing  . Swelling of face and throat  . A fast heartbeat  . A bad rash all over body  . Dizziness and weakness   Immunizations Administered    Name Date Dose VIS Date Route   PFIZER Comrnaty(Gray TOP) Covid-19 Vaccine 01/30/2020  1:23 PM 0.3 mL 12/14/2019 Intramuscular   Manufacturer: [place]   Lot: [id number]   NDC: [phone]

## 2020-01-31 ENCOUNTER — Ambulatory Visit: Payer: Medicare HMO | Admitting: Internal Medicine

## 2020-02-28 ENCOUNTER — Telehealth: Payer: Self-pay | Admitting: Adult Health
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
## 2020-02-28 NOTE — Telephone Encounter (Signed)
This request would have went to medical records.  Would not have come to [facility] or myself.

## 2020-04-01 ENCOUNTER — Other Ambulatory Visit: Payer: Self-pay | Admitting: Adult Health

## 2020-04-01 DIAGNOSIS — I10 Essential (primary) hypertension: Secondary | ICD-10-CM

## 2020-04-11 ENCOUNTER — Other Ambulatory Visit: Payer: Self-pay

## 2020-04-11 NOTE — Progress Notes (Signed)
Subjective:    Patient ID: Heather Gibson, female    DOB: 06-07-1950, 70 y.o.   MRN: [id number]  HPI  Patient presents for yearly preventative medicine examination. She is a pleasant 70 year old female who  has a past medical history of Arthritis, Constipation, GERD (gastroesophageal reflux disease), Hyperlipidemia, and Hypertension.  She plans on retiring in October 2022.   Essential Hypertension - Controlled with Norvasc 10 mg and lisinopril/HCTZ 20-25 mg. She denies dizziness, lightheadedness, chest pain, or shortness of breath  BP Readings from Last 3 Encounters:  04/12/20 140/80  10/25/19 138/70  10/20/19 136/80   GERD - takes Prilosec 20 mg daily  Hyperlipidemia - has been trialed on Crestor and Pravastatin in the past. Each caused myalgia. She was seen at the lipid clinic in October 2021who recommended Lipitor 40 mg QHS. She reports that she has only had two cramps with this medication and has been taking it every night.  Lab Results  Component Value Date   CHOL 269 (H) 05/16/2019   HDL 58.70 05/16/2019   LDLCALC 179 (H) 05/16/2019   TRIG 160.0 (H) 05/16/2019   CHOLHDL 5 05/16/2019   Urinary Incontinence - Reports urgency and frequency with dribbling. Denies dysuria or hematuria   All immunizations and health maintenance protocols were reviewed with the patient and needed orders were placed.  Appropriate screening laboratory values were ordered for the patient including screening of hyperlipidemia, renal function and hepatic function.   Medication reconciliation,  past medical history, social history, problem list and allergies were reviewed in detail with the patient  Goals were established with regard to weight loss, exercise, and  diet in compliance with medications  Wt Readings from Last 3 Encounters:  04/12/20 224 lb 9.6 oz (101.9 kg)  10/25/19 225 lb (102.1 kg)  10/20/19 224 lb (101.6 kg)   She is up to date on routine colon cancer screening.     Review of Systems  Constitutional: Negative.   HENT: Negative.   Eyes: Negative.   Respiratory: Negative.   Cardiovascular: Negative.   Gastrointestinal: Negative.   Endocrine: Negative.   Genitourinary: Positive for frequency and urgency.  Musculoskeletal: Positive for arthralgias (chronic right knee).  Skin: Negative.   Allergic/Immunologic: Negative.   Neurological: Negative.   Hematological: Negative.   Psychiatric/Behavioral: Negative.    Past Medical History:  Diagnosis Date  . Arthritis   . Constipation   . GERD (gastroesophageal reflux disease)   . Hyperlipidemia   . Hypertension     Social History   Socioeconomic History  . Marital status: Married    Spouse name: Not on file  . Number of children: Not on file  . Years of education: Not on file  . Highest education level: Not on file  Occupational History  . Not on file  Tobacco Use  . Smoking status: Never Smoker  . Smokeless tobacco: Never Used  Vaping Use  . Vaping Use: Never used  Substance and Sexual Activity  . Alcohol use: No  . Drug use: No  . Sexual activity: Not Currently    Birth control/protection: Post-menopausal  Other Topics Concern  . 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 Difficulty of Paying Living Expenses: Not hard at all  Food Insecurity: No Food Insecurity  . Worried About Charity fundraiser in the Last Year: Never true  . Ran Out of Food in the Last Year: Never true  Transportation Needs: No Transportation Needs  . Lack of Transportation (Medical): No  . Lack of Transportation (Non-Medical): No  Physical Activity: Insufficiently Active  . Days of Exercise per Week: 7 days  . Minutes of Exercise per Session: 20 min  Stress: No Stress Concern Present  . Feeling of Stress  : Not at all  Social Connections: Moderately Integrated  . Frequency of Communication with Friends and Family: More than three times a week  . Frequency of Social Gatherings with Friends and Family: More than three times a week  . Attends Religious Services: More than 4 times per year  . Active Member of Clubs or Organizations: Yes  . Attends Archivist Meetings: More than 4 times per year  . Marital Status: Separated  Intimate Partner Violence: Not At Risk  . Fear of Current or Ex-Partner: No  . Emotionally Abused: No  . Physically Abused: No  . Sexually Abused: No    Past Surgical History:  Procedure Laterality Date  . ABDOMINAL HYSTERECTOMY    . COLONOSCOPY  2017  . COLPORRHAPHY    . PROLAPSED UTERINE FIBROID LIGATION     x2  . SALPINGOOPHORECTOMY Right     Family History  Problem Relation Age of Onset  . Breast cancer Mother   . Hypertension Mother   . Schizophrenia Mother   . Hypertension Maternal Grandmother   . Diabetes Maternal Grandmother   . Breast cancer Maternal Grandmother        ?  . Colon cancer Neg Hx   . Colon polyps Neg Hx   . Esophageal cancer Neg Hx   . Rectal cancer Neg Hx   . Stomach cancer Neg Hx     Allergies  Allergen Reactions  . Crestor [Rosuvastatin] Other (See Comments)    Myalgia   . Eggs Or Egg-Derived Products     Years ago dx- eats egg now without any problems  . Latex Swelling  . Milk-Related Compounds     Cannot have large amt of milk products  . Peanut-Containing Drug Products     Cannot have large amts of  . Tramadol Itching  . Shellfish Allergy Itching    Current Outpatient Medications on File Prior to Visit  Medication Sig Dispense Refill  . acetaminophen (TYLENOL) 325 MG tablet Take 650 mg by mouth every 6 (six) hours as needed.    Marland Kitchen amLODipine (NORVASC) 10 MG tablet TAKE 1 TABLET EVERY DAY 90 tablet 3  . diclofenac sodium (VOLTAREN) 1 % GEL Apply 2 g topically 4 (four) times daily as needed. 100 g 3  .  lisinopril-hydrochlorothiazide (ZESTORETIC) 20-25 MG tablet TAKE 1 TABLET EVERY DAY 90 tablet 3  . omeprazole (PRILOSEC) 20 MG capsule TAKE 1 CAPSULE(20 MG) BY MOUTH DAILY 90 capsule 2  . atorvastatin (LIPITOR) 40 MG tablet Take 1 tablet (40 mg total) by mouth daily. 90 tablet 3   No current facility-administered medications on file prior to visit.    BP 140/80 (BP Location: Left Arm, Patient Position: Sitting, Cuff Size: Large)   Pulse 60   Temp 98.3 F (36.8 C) (Oral)   Ht 5\' 3"  (1.6 m)   Wt 224 lb 9.6 oz (101.9 kg)   SpO2 95%   BMI 39.79 kg/m       Objective:  Physical Exam Vitals and nursing note reviewed.  Constitutional:      General: She is not in acute distress.    Appearance: Normal appearance. She is well-developed. She is obese. She is not ill-appearing.  HENT:     Head: Normocephalic and atraumatic.     Right Ear: Tympanic membrane, ear canal and external ear normal. There is no impacted cerumen.     Left Ear: Tympanic membrane, ear canal and external ear normal. There is no impacted cerumen.     Nose: Nose normal. No congestion or rhinorrhea.     Mouth/Throat:     Mouth: Mucous membranes are moist.     Pharynx: Oropharynx is clear. No oropharyngeal exudate or posterior oropharyngeal erythema.  Eyes:     General:        Right eye: No discharge.        Left eye: No discharge.     Extraocular Movements: Extraocular movements intact.     Conjunctiva/sclera: Conjunctivae normal.     Pupils: Pupils are equal, round, and reactive to light.  Neck:     Thyroid: No thyromegaly.     Vascular: No carotid bruit.     Trachea: No tracheal deviation.  Cardiovascular:     Rate and Rhythm: Normal rate and regular rhythm.     Pulses: Normal pulses.     Heart sounds: Normal heart sounds. No murmur heard. No friction rub. No gallop.   Pulmonary:     Effort: Pulmonary effort is normal. No respiratory distress.     Breath sounds: Normal breath sounds. No stridor. No  wheezing, rhonchi or rales.  Chest:     Chest wall: No tenderness.  Abdominal:     General: Abdomen is flat. Bowel sounds are normal. There is no distension.     Palpations: Abdomen is soft. There is no mass.     Tenderness: There is no abdominal tenderness. There is right CVA tenderness and left CVA tenderness. There is no guarding or rebound.     Hernia: No hernia is present.  Musculoskeletal:        General: No swelling, tenderness, deformity or signs of injury.     Cervical back: Normal range of motion and neck supple.     Right knee: Bony tenderness and crepitus present. No swelling. Decreased range of motion.     Right lower leg: No edema.     Left lower leg: No edema.  Lymphadenopathy:     Cervical: No cervical adenopathy.  Skin:    General: Skin is warm and dry.     Capillary Refill: Capillary refill takes less than 2 seconds.     Coloration: Skin is not jaundiced or pale.     Findings: No bruising, erythema, lesion or rash.  Neurological:     General: No focal deficit present.     Mental Status: She is alert and oriented to person, place, and time.     Cranial Nerves: No cranial nerve deficit.     Sensory: No sensory deficit.     Motor: No weakness.     Coordination: Coordination normal.     Gait: Gait normal.     Deep Tendon Reflexes: Reflexes normal.  Psychiatric:        Mood and Affect: Mood normal.        Behavior: Behavior normal.        Thought Content: Thought content normal.        Judgment: Judgment normal.  Assessment & Plan:  1. Routine general medical examination at a health care facility - Encouraged weight loss through lifestyle modifications - Follow up in one year or sooner if needed - CBC with Differential/Platelet; Future - Comprehensive metabolic panel; Future - Hemoglobin A1c; Future - Lipid panel; Future - TSH; Future  2. Essential hypertension - Controlled. No change  - CBC with Differential/Platelet; Future - Comprehensive  metabolic panel; Future - Hemoglobin A1c; Future - Lipid panel; Future - TSH; Future  3. Mixed hyperlipidemia - Continue with Atorvastatin  - CBC with Differential/Platelet; Future - Comprehensive metabolic panel; Future - Hemoglobin A1c; Future - Lipid panel; Future - TSH; Future  4. Gastroesophageal reflux disease without esophagitis - Continue with Prilosec - CBC with Differential/Platelet; Future - Comprehensive metabolic panel; Future - Hemoglobin A1c; Future - Lipid panel; Future - TSH; Future  5. Urge incontinence of urine - Information given on pelvic floor exercises - Follow up PRN  - POC Urinalysis Dipstick; Future  Dorothyann Peng, NP

## 2020-04-12 ENCOUNTER — Encounter: Payer: Self-pay | Admitting: Adult Health

## 2020-04-12 ENCOUNTER — Other Ambulatory Visit: Payer: Self-pay | Admitting: Adult Health

## 2020-04-12 ENCOUNTER — Ambulatory Visit (INDEPENDENT_AMBULATORY_CARE_PROVIDER_SITE_OTHER): Payer: Medicare HMO | Admitting: Adult Health

## 2020-04-12 VITALS — BP 140/80 | HR 60 | Temp 98.3°F | Ht 63.0 in | Wt 224.6 lb

## 2020-04-12 DIAGNOSIS — N3941 Urge incontinence: Secondary | ICD-10-CM | POA: Diagnosis not present

## 2020-04-12 DIAGNOSIS — I10 Essential (primary) hypertension: Secondary | ICD-10-CM

## 2020-04-12 DIAGNOSIS — Z0000 Encounter for general adult medical examination without abnormal findings: Secondary | ICD-10-CM | POA: Diagnosis not present

## 2020-04-12 DIAGNOSIS — E782 Mixed hyperlipidemia: Secondary | ICD-10-CM | POA: Diagnosis not present

## 2020-04-12 DIAGNOSIS — K219 Gastro-esophageal reflux disease without esophagitis: Secondary | ICD-10-CM

## 2020-04-12 LAB — LIPID PANEL
Cholesterol: 253 mg/dL — ABNORMAL HIGH (ref 0–200)
HDL: 49 mg/dL (ref >39.00)
LDL Cholesterol: 171 mg/dL — ABNORMAL HIGH (ref 0–99)
NonHDL: 203.83
Total CHOL/HDL Ratio: 5
Triglycerides: 164 mg/dL — ABNORMAL HIGH (ref 0.0–149.0)
VLDL: 32.8 mg/dL (ref 0.0–40.0)

## 2020-04-12 LAB — POC URINALSYSI DIPSTICK (AUTOMATED)
Bilirubin, UA: NEGATIVE
Blood, UA: NEGATIVE
Glucose, UA: NEGATIVE
Ketones, UA: NEGATIVE
Nitrite, UA: POSITIVE
Protein, UA: NEGATIVE
Spec Grav, UA: 1.015 (ref 1.010–1.025)
Urobilinogen, UA: 0.2 E.U./dL
pH, UA: 7.5 (ref 5.0–8.0)

## 2020-04-12 LAB — CBC WITH DIFFERENTIAL/PLATELET
Basophils Absolute: 0 10*3/uL (ref 0.0–0.1)
Basophils Relative: 0.5 % (ref 0.0–3.0)
Eosinophils Absolute: 0.2 10*3/uL (ref 0.0–0.7)
Eosinophils Relative: 2.4 % (ref 0.0–5.0)
HCT: 42.3 % (ref 36.0–46.0)
Hemoglobin: 14.1 g/dL (ref 12.0–15.0)
Lymphocytes Relative: 26.3 % (ref 12.0–46.0)
Lymphs Abs: 2.3 10*3/uL (ref 0.7–4.0)
MCHC: 33.3 g/dL (ref 30.0–36.0)
MCV: 88.2 fl (ref 78.0–100.0)
Monocytes Absolute: 0.6 10*3/uL (ref 0.1–1.0)
Monocytes Relative: 7.2 % (ref 3.0–12.0)
Neutro Abs: 5.7 10*3/uL (ref 1.4–7.7)
Neutrophils Relative %: 63.6 % (ref 43.0–77.0)
Platelets: 421 10*3/uL — ABNORMAL HIGH (ref 150.0–400.0)
RBC: 4.8 Mil/uL (ref 3.87–5.11)
RDW: 14.6 % (ref 11.5–15.5)
WBC: 8.9 10*3/uL (ref 4.0–10.5)

## 2020-04-12 LAB — COMPREHENSIVE METABOLIC PANEL
ALT: 17 U/L (ref 0–35)
AST: 17 U/L (ref 0–37)
Albumin: 4.2 g/dL (ref 3.5–5.2)
Alkaline Phosphatase: 70 U/L (ref 39–117)
BUN: 17 mg/dL (ref 6–23)
CO2: 29 mEq/L (ref 19–32)
Calcium: 9.8 mg/dL (ref 8.4–10.5)
Chloride: 100 mEq/L (ref 96–112)
Creatinine, Ser: 0.74 mg/dL (ref 0.40–1.20)
GFR: 83.12 mL/min (ref >60.00)
Glucose, Bld: 98 mg/dL (ref 70–99)
Potassium: 3.4 mEq/L — ABNORMAL LOW (ref 3.5–5.1)
Sodium: 138 mEq/L (ref 135–145)
Total Bilirubin: 0.4 mg/dL (ref 0.2–1.2)
Total Protein: 7.5 g/dL (ref 6.0–8.3)

## 2020-04-12 LAB — TSH: TSH: 0.29 u[IU]/mL — ABNORMAL LOW (ref 0.35–4.50)

## 2020-04-12 LAB — HEMOGLOBIN A1C: Hgb A1c MFr Bld: 5.4 % (ref 4.6–6.5)

## 2020-04-12 NOTE — Addendum Note (Signed)
Addended by: Nilda Riggs on: 04/12/2020 10:15 AM   Modules accepted: Orders

## 2020-04-12 NOTE — Addendum Note (Signed)
Addended by: Tessie Fass D on: 04/12/2020 10:22 AM   Modules accepted: Orders

## 2020-04-12 NOTE — Patient Instructions (Signed)
It was great seeing you today   We will follow up with you regarding your blood work   Please start walking more   I would be happy to remove those cysts on your head - schedule the last appointment of the day

## 2020-04-12 NOTE — Progress Notes (Signed)
Order for urine culture. Patient with + nit and Leuks on POC urinalysis

## 2020-04-14 LAB — URINE CULTURE
MICRO NUMBER:: 11748460
SPECIMEN QUALITY:: ADEQUATE

## 2020-04-15 ENCOUNTER — Telehealth: Payer: Self-pay | Admitting: Adult Health

## 2020-04-15 DIAGNOSIS — E785 Hyperlipidemia, unspecified: Secondary | ICD-10-CM

## 2020-04-15 DIAGNOSIS — E782 Mixed hyperlipidemia: Secondary | ICD-10-CM

## 2020-04-15 NOTE — Telephone Encounter (Signed)
Patient is calling and requesting a call back regarding lab results, please advise. CB is [phone]

## 2020-04-16 ENCOUNTER — Other Ambulatory Visit: Payer: Self-pay | Admitting: Adult Health

## 2020-04-16 DIAGNOSIS — R7989 Other specified abnormal findings of blood chemistry: Secondary | ICD-10-CM

## 2020-04-16 MED ORDER — AMOXICILLIN-POT CLAVULANATE 875-125 MG PO TABS: 1.0000 | ORAL_TABLET | Freq: Two times a day (BID) | ORAL | 0 refills | Status: DC

## 2020-04-16 NOTE — Telephone Encounter (Signed)
Spoke with patient about lab results, is aware Rx is at local pharmacy.  Lab appointment made to recheck Thyroid. Referral to Lipid clinic placed.

## 2020-04-18 ENCOUNTER — Other Ambulatory Visit: Payer: Self-pay

## 2020-04-18 ENCOUNTER — Ambulatory Visit
Admission: RE | Admit: 2020-04-18 | Discharge: 2020-04-18 | Disposition: A | Discharge status: Home / Self Care | Payer: Medicare HMO | Source: Ambulatory Visit | Attending: Adult Health | Admitting: Adult Health

## 2020-04-18 DIAGNOSIS — Z1239 Encounter for other screening for malignant neoplasm of breast: Secondary | ICD-10-CM

## 2020-04-18 DIAGNOSIS — Z1231 Encounter for screening mammogram for malignant neoplasm of breast: Secondary | ICD-10-CM | POA: Diagnosis not present

## 2020-05-06 ENCOUNTER — Telehealth: Payer: Self-pay | Admitting: Adult Health

## 2020-05-06 DIAGNOSIS — E785 Hyperlipidemia, unspecified: Secondary | ICD-10-CM

## 2020-05-06 DIAGNOSIS — Z0000 Encounter for general adult medical examination without abnormal findings: Secondary | ICD-10-CM

## 2020-05-07 ENCOUNTER — Other Ambulatory Visit: Payer: Self-pay

## 2020-05-07 ENCOUNTER — Telehealth: Payer: Self-pay | Admitting: Adult Health

## 2020-05-07 ENCOUNTER — Other Ambulatory Visit (INDEPENDENT_AMBULATORY_CARE_PROVIDER_SITE_OTHER): Payer: Medicare HMO

## 2020-05-07 DIAGNOSIS — R7989 Other specified abnormal findings of blood chemistry: Secondary | ICD-10-CM

## 2020-05-07 LAB — T3, FREE: T3, Free: 3.5 pg/mL (ref 2.3–4.2)

## 2020-05-07 LAB — TSH: TSH: 0.52 u[IU]/mL (ref 0.35–4.50)

## 2020-05-07 LAB — T4, FREE: Free T4: 0.63 ng/dL (ref 0.60–1.60)

## 2020-05-07 NOTE — Telephone Encounter (Signed)
Patient is returning the call, please advise. CB is [phone]

## 2020-05-07 NOTE — Telephone Encounter (Signed)
See results note. 

## 2020-05-09 NOTE — Telephone Encounter (Signed)
Patient needs a PA for this medication

## 2020-05-10 NOTE — Telephone Encounter (Signed)
Spoke with the pt and informed her Pravastatin is not on her current medication list.  Advised the pt Atorvastatin is on her list which was given by Dr Debara Pickett and advised she contact her cardiologist with questions.

## 2020-05-13 ENCOUNTER — Telehealth: Payer: Self-pay | Admitting: Internal Medicine

## 2020-05-13 MED ORDER — ATORVASTATIN CALCIUM 40 MG PO TABS: 40.0000 mg | ORAL_TABLET | Freq: Every day | ORAL | 3 refills | Status: DC

## 2020-05-13 NOTE — Telephone Encounter (Signed)
*  STAT* If patient is at the pharmacy, call can be transferred to refill team.   1. Which medications need to be refilled? (please list name of each medication and dose if known)  A new prescriptiono Atorvastatin  2. Which pharmacy/location (including street and city if local pharmacy) is medication to be sent to? [place],  [place]  3. Do they need a 30 day or 90 day supply?90 days

## 2020-05-13 NOTE — Telephone Encounter (Signed)
Patient called wanting a refill on her PRAVASTATIN SODIUM 20 MG Tablet. I advised the patient that she is not taking that medication any longer that she went to a lipid clinic to see Dr. Lyman Bishop and he prescribed her atorvastatin (LIPITOR) 40 MG tablet. I advised the patient to contact Dr. Debara Pickett and gave her their office number to contact them.  She stated that Dr,. Hilty didn't prescribe her any medication and that Tommi Rumps is her doctor and she wants all of her medications prescribed by [facility].  Please advise

## 2020-05-14 NOTE — Telephone Encounter (Signed)
Rx was sent in yesterday by Dr. Lysbeth Penner office.

## 2020-05-14 NOTE — Telephone Encounter (Signed)
Noted  

## 2020-06-20 ENCOUNTER — Other Ambulatory Visit: Payer: Self-pay | Admitting: Adult Health

## 2020-06-20 DIAGNOSIS — K219 Gastro-esophageal reflux disease without esophagitis: Secondary | ICD-10-CM

## 2020-07-11 DIAGNOSIS — Z20822 Contact with and (suspected) exposure to covid-19: Secondary | ICD-10-CM | POA: Diagnosis not present

## 2020-08-23 ENCOUNTER — Encounter: Payer: Self-pay | Admitting: Internal Medicine

## 2020-08-23 ENCOUNTER — Other Ambulatory Visit: Payer: Self-pay

## 2020-08-23 ENCOUNTER — Ambulatory Visit: Payer: Medicare HMO | Admitting: Internal Medicine

## 2020-08-23 VITALS — BP 144/80 | HR 57 | Ht 66.0 in | Wt 219.0 lb

## 2020-08-23 DIAGNOSIS — E782 Mixed hyperlipidemia: Secondary | ICD-10-CM

## 2020-08-23 DIAGNOSIS — I10 Essential (primary) hypertension: Secondary | ICD-10-CM

## 2020-08-23 DIAGNOSIS — M791 Myalgia, unspecified site: Secondary | ICD-10-CM | POA: Diagnosis not present

## 2020-08-23 DIAGNOSIS — T466X5A Adverse effect of antihyperlipidemic and antiarteriosclerotic drugs, initial encounter: Secondary | ICD-10-CM

## 2020-08-23 DIAGNOSIS — Z79899 Other long term (current) drug therapy: Secondary | ICD-10-CM | POA: Diagnosis not present

## 2020-08-23 DIAGNOSIS — T466X5D Adverse effect of antihyperlipidemic and antiarteriosclerotic drugs, subsequent encounter: Secondary | ICD-10-CM

## 2020-08-23 LAB — LIPID PANEL
Chol/HDL Ratio: 4.3 ratio (ref 0.0–4.4)
Cholesterol, Total: 219 mg/dL — ABNORMAL HIGH (ref 100–199)
HDL: 51 mg/dL (ref >39)
LDL Chol Calc (NIH): 139 mg/dL — ABNORMAL HIGH (ref 0–99)
Triglycerides: 162 mg/dL — ABNORMAL HIGH (ref 0–149)
VLDL Cholesterol Cal: 29 mg/dL (ref 5–40)

## 2020-08-23 NOTE — Patient Instructions (Signed)
Medication Instructions:  Your Physician recommend you continue on your current medication as directed.    *If you need a refill on your cardiac medications before your next appointment, please call your pharmacy*   Lab Work: Your physician recommends lab work today (Lipid).  If you have labs (blood work) drawn today and your tests are completely normal, you will receive your results only by: [place] (if you have MyChart) OR A paper copy in the mail If you have any lab test that is abnormal or we need to change your treatment, we will call you to review the results.   Testing/Procedures: Dr. Debara Pickett has ordered a CT coronary calcium score. This test is done at [address]. This is $99 out of pocket.   Coronary CalciumScan A coronary calcium scan is an imaging test used to look for deposits of calcium and other fatty materials (plaques) in the inner lining of the blood vessels of the heart (coronary arteries). These deposits of calcium and plaques can partly clog and narrow the coronary arteries without producing any symptoms or warning signs. This puts a person at risk for a heart attack. This test can detect these deposits before symptoms develop. Tell a health care provider about: Any allergies you have. All medicines you are taking, including vitamins, herbs, eye drops, creams, and over-the-counter medicines. Any problems you or family members have had with anesthetic medicines. Any blood disorders you have. Any surgeries you have had. Any medical conditions you have. Whether you are pregnant or may be pregnant. What are the risks? Generally, this is a safe procedure. However, problems may occur, including: Harm to a pregnant woman and her unborn baby. This test involves the use of radiation. Radiation exposure can be dangerous to a pregnant woman and her unborn baby. If you are pregnant, you generally should not have this procedure done. Slight increase  in the risk of cancer. This is because of the radiation involved in the test. What happens before the procedure? No preparation is needed for this procedure. What happens during the procedure? You will undress and remove any jewelry around your neck or chest. You will put on a hospital gown. Sticky electrodes will be placed on your chest. The electrodes will be connected to an electrocardiogram (ECG) machine to record a tracing of the electrical activity of your heart. A CT scanner will take pictures of your heart. During this time, you will be asked to lie still and hold your breath for 2-3 seconds while a picture of your heart is being taken. The procedure may vary among health care providers and hospitals. What happens after the procedure? You can get dressed. You can return to your normal activities. It is up to you to get the results of your test. Ask your health care provider, or the department that is doing the test, when your results will be ready. Summary A coronary calcium scan is an imaging test used to look for deposits of calcium and other fatty materials (plaques) in the inner lining of the blood vessels of the heart (coronary arteries). Generally, this is a safe procedure. Tell your health care provider if you are pregnant or may be pregnant. No preparation is needed for this procedure. A CT scanner will take pictures of your heart. You can return to your normal activities after the scan is done. This information is not intended to replace advice given to you by your health care provider. Make sure you discuss  any questions you have with your health care provider. Document Released: 06/20/2007 Document Revised: 11/11/2015 Document Reviewed: 11/11/2015 Elsevier Interactive Patient Education  2017 [place]: At [facility], you and your health needs are our priority.  As part of our continuing mission to provide you with exceptional heart care, we have  created designated Provider Care Teams.  These Care Teams include your primary Cardiologist (physician) and Advanced Practice Providers (APPs -  Physician Assistants and Nurse Practitioners) who all work together to provide you with the care you need, when you need it.  We recommend signing up for the patient portal called "MyChart".  Sign up information is provided on this After Visit Summary.  MyChart is used to connect with patients for Virtual Visits (Telemedicine).  Patients are able to view lab/test results, encounter notes, upcoming appointments, etc.  Non-urgent messages can be sent to your provider as well.   To learn more about what you can do with MyChart, go to [email or web address].    Your next appointment:   6 month(s)  The format for your next appointment:   In Person  Provider:   K. Mali Hilty, MD

## 2020-08-23 NOTE — Progress Notes (Signed)
LIPID CLINIC CONSULT NOTE  Chief Complaint:  Manage dyslipidemia  Primary Care Physician: Dorothyann Peng, NP  Primary Cardiologist:  None  HPI:  Heather Gibson is a 70 y.o. female who is being seen today for the evaluation of dyslipidemia at the request of Dorothyann Peng, NP. This is a pleasant 70 year old female kindly referred for management of dyslipidemia.  She has no family history of heart disease in her mother died of pneumonia at 13 and her father had no heart disease that she is aware of.  There is hypertension in the family and she is well treated with hypertension.  She has a history of longstanding dyslipidemia with LDL cholesterol times over 200 although most simply her LDL was 179, total cholesterol 269, HDL of 59 and triglycerides of 160.  In the past she has been tried on statins including rosuvastatin which caused some myalgias and therefore she discontinued it.  She is currently on no therapies.  She reports a varied but mostly [place] diet with some fried foods although does like vegetables and tries to avoid certain fats and uses all of oil.  Overall she is pretty sedentary.  She has no known history of coronary disease.  08/23/2020  [name] returns today for follow-up.  She has been taking atorvastatin 40 mg nightly for the past 90 days.  She recently refilled her medicine.  She says she is tolerating it well without any significant side effects.  She has not had her cholesterol retested and we will plan to do that today.  I had also ordered a calcium score however that was not obtained either.  Is not clear what happened but she is interested in the study and we will go ahead and try to schedule that today.   PMHx:  Past Medical History:  Diagnosis Date   Arthritis    Constipation    GERD (gastroesophageal reflux disease)    Hyperlipidemia    Hypertension     Past Surgical History:  Procedure Laterality Date   ABDOMINAL HYSTERECTOMY      COLONOSCOPY  2017   COLPORRHAPHY     PROLAPSED UTERINE FIBROID LIGATION     x2   SALPINGOOPHORECTOMY Right     FAMHx:  Family History  Problem Relation Age of Onset   Breast cancer Mother    Hypertension Mother    Schizophrenia Mother    Hypertension Maternal Grandmother    Diabetes Maternal Grandmother    Breast cancer Maternal Grandmother        ?   Colon cancer Neg Hx    Colon polyps Neg Hx    Esophageal cancer Neg Hx    Rectal cancer Neg Hx    Stomach cancer Neg Hx     SOCHx:   reports that she has never smoked. She has never used smokeless tobacco. She reports that she does not drink alcohol and does not use drugs.  ALLERGIES:  Allergies  Allergen Reactions   Crestor [Rosuvastatin] Other (See Comments)    Myalgia    Eggs Or Egg-Derived Products     Years ago dx- eats egg now without any problems   Latex Swelling   Milk-Related Compounds     Cannot have large amt of milk products   Peanut-Containing Drug Products     Cannot have large amts of   Tramadol Itching   Shellfish Allergy Itching    ROS: Pertinent items noted in HPI and remainder of comprehensive ROS otherwise negative.  HOME  MEDS: Current Outpatient Medications on File Prior to Visit  Medication Sig Dispense Refill   acetaminophen (TYLENOL) 325 MG tablet Take 650 mg by mouth every 6 (six) hours as needed.     amLODipine (NORVASC) 10 MG tablet TAKE 1 TABLET EVERY DAY 90 tablet 3   atorvastatin (LIPITOR) 40 MG tablet Take 1 tablet (40 mg total) by mouth daily. 90 tablet 3   diclofenac sodium (VOLTAREN) 1 % GEL Apply 2 g topically 4 (four) times daily as needed. 100 g 3   lisinopril-hydrochlorothiazide (ZESTORETIC) 20-25 MG tablet TAKE 1 TABLET EVERY DAY 90 tablet 3   omeprazole (PRILOSEC) 20 MG capsule TAKE 1 CAPSULE EVERY DAY 90 capsule 2   No current facility-administered medications on file prior to visit.    LABS/IMAGING: No results found for this or any previous visit (from the past 48  hour(s)). No results found.  LIPID PANEL:    Component Value Date/Time   CHOL 253 (H) 04/12/2020 0855   TRIG 164.0 (H) 04/12/2020 0855   HDL 49.00 04/12/2020 0855   CHOLHDL 5 04/12/2020 0855   VLDL 32.8 04/12/2020 0855   LDLCALC 171 (H) 04/12/2020 0855    WEIGHTS: Wt Readings from Last 3 Encounters:  08/23/20 219 lb (99.3 kg)  04/12/20 224 lb 9.6 oz (101.9 kg)  10/25/19 225 lb (102.1 kg)    VITALS: BP (!) 144/80   Pulse (!) 57   Ht '5\' 6"'$  (1.676 m)   Wt 219 lb (99.3 kg)   SpO2 98%   BMI 35.35 kg/m   EXAM: Deferred  EKG: Deferred  ASSESSMENT: Mixed dyslipidemia,?  Familial hyperlipidemia Hypertension-controlled Myalgia to Crestor  PLAN: 1.   [name] has been taking her statin for the past 90 days.  She will need repeat lipids today.  She is still interested in getting a calcium score which we will try to schedule.  We will plan a follow-up in about 6 months.  I will contact her with the results of her labs to see if she has had some improvement with her numbers.  So far she seems to be tolerating atorvastatin.  Pixie Casino, MD, [facility], [place] Director of the Advanced Lipid Disorders &  Cardiovascular Risk Reduction Clinic Diplomate of the American Board of Clinical Lipidology Attending Cardiologist  Direct Dial: [phone]  Fax: [phone]  Website:  [email or web address] [place] 08/23/2020, 9:26 AM

## 2020-08-26 ENCOUNTER — Telehealth: Payer: Self-pay | Admitting: Internal Medicine

## 2020-08-26 NOTE — Telephone Encounter (Signed)
Pixie Casino, MD  Fidel Levy, RN  LDL down to 139 from 171 - less than 50% expected with atorvastatin 40 mg daily - she reports more than 90 days compliance - continue with this medication and diet. Await calcium score results.   Dr. Lemmie Evens

## 2020-08-26 NOTE — Telephone Encounter (Signed)
Follow Up:   Pt wants to know if her lab results are ready from Friday(08-23-20)?

## 2020-08-26 NOTE — Telephone Encounter (Signed)
Patient called w/results Calcium score scheduled 09/11/20

## 2020-09-10 ENCOUNTER — Telehealth: Payer: Self-pay | Admitting: Adult Health

## 2020-09-10 ENCOUNTER — Other Ambulatory Visit: Payer: Self-pay | Admitting: Adult Health

## 2020-09-10 MED ORDER — METHYLPREDNISOLONE 4 MG PO TBPK: ORAL_TABLET | ORAL | 0 refills | Status: DC

## 2020-09-10 NOTE — Telephone Encounter (Signed)
Spoke to pt and she stated that the itching just came about yesterday.Pt stated that she does not know the cause but skin shows no rash.  Pt stated she has a hx of itchy skin and Cory prescribed her something for it in the past. Pt advised that she may need to make an appt. Pt verbalized understanding.

## 2020-09-10 NOTE — Telephone Encounter (Signed)
PT called to advise that they are having itchy skin and have been unable to sleep well. They stated in the past they got a med that help and would like to get that again. They could not recall the name of it. Please advise.

## 2020-09-11 ENCOUNTER — Other Ambulatory Visit: Payer: Self-pay

## 2020-09-11 ENCOUNTER — Telehealth: Payer: Self-pay | Admitting: Adult Health

## 2020-09-11 ENCOUNTER — Ambulatory Visit (INDEPENDENT_AMBULATORY_CARE_PROVIDER_SITE_OTHER)
Admission: RE | Admit: 2020-09-11 | Discharge: 2020-09-11 | Disposition: A | Discharge status: Home / Self Care | Payer: Self-pay | Source: Ambulatory Visit | Attending: Internal Medicine | Admitting: Internal Medicine

## 2020-09-11 DIAGNOSIS — E785 Hyperlipidemia, unspecified: Secondary | ICD-10-CM

## 2020-09-11 NOTE — Telephone Encounter (Signed)
Called pt to let her know that [facility] sent in the [place]aff[place] for the itchy skin to her pharmacy yesterday.

## 2020-09-11 NOTE — Telephone Encounter (Signed)
Patient is returning a call to [place].  Please advise.

## 2020-09-11 NOTE — Telephone Encounter (Signed)
Noted  

## 2020-09-11 NOTE — Telephone Encounter (Signed)
Patient called back and was told that the Zpack was sent in for her itchy skin to her pharmacy.  Patient says that she is also needing a medication to help her sleep and would like something sent in for that.  She also wanted to let Tommi Rumps know that she completed the appointment for the referral he placed for her and that she was told that notes would be sent to this office for [facility] to review.  Please advise.

## 2020-09-11 NOTE — Telephone Encounter (Signed)
Tried to call pt and update of message below no answer.

## 2020-09-12 ENCOUNTER — Telehealth: Payer: Self-pay | Admitting: Internal Medicine

## 2020-09-12 ENCOUNTER — Other Ambulatory Visit: Payer: Self-pay

## 2020-09-12 MED ORDER — ATORVASTATIN CALCIUM 40 MG PO TABS: 40.0000 mg | ORAL_TABLET | Freq: Every day | ORAL | 3 refills | Status: DC

## 2020-09-12 NOTE — Telephone Encounter (Signed)
Pt reaching out for ct scan results... please advise

## 2020-09-12 NOTE — Telephone Encounter (Signed)
Patient called in to get results from her Calcium Score she completed yesterday. I made patient aware that results were sent to Dr. Debara Pickett to review and out office will contact her with results once he has reviewed the report. She agreed and no additional questions at this time.

## 2020-09-12 NOTE — Telephone Encounter (Signed)
Pt has been scheduled for a visit for insomnia.

## 2020-09-13 NOTE — Telephone Encounter (Signed)
Patient calling back to see if dr hilty review her results

## 2020-09-13 NOTE — Telephone Encounter (Signed)
I spoke with patient and let her know we would call once reviewed by Dr Debara Pickett

## 2020-09-18 ENCOUNTER — Encounter: Payer: Self-pay | Admitting: Adult Health

## 2020-09-18 ENCOUNTER — Telehealth (INDEPENDENT_AMBULATORY_CARE_PROVIDER_SITE_OTHER): Payer: Medicare HMO | Admitting: Adult Health

## 2020-09-18 VITALS — Ht 66.0 in | Wt 219.0 lb

## 2020-09-18 DIAGNOSIS — F5101 Primary insomnia: Secondary | ICD-10-CM

## 2020-09-18 MED ORDER — TRAZODONE HCL 50 MG PO TABS: 25.0000 mg | ORAL_TABLET | Freq: Every evening | ORAL | 0 refills | Status: DC | PRN

## 2020-09-18 NOTE — Progress Notes (Signed)
.Virtual Visit via Video Note  I connected with Heather Gibson on 09/18/20 at 10:30 AM EDT by a video enabled telemedicine application and verified that I am speaking with the correct person using two identifiers.  Location patient: home Location provider:work or home office Persons participating in the virtual visit: patient, provider  I discussed the limitations of evaluation and management by telemedicine and the availability of in person appointments. The patient expressed understanding and agreed to proceed.   HPI: 70 year old female who is being evaluated today for a chronic issue of insomnia.  She has a self-reported longtime history of having trouble with sleeping.  She is only used Tylenol PM, which she is currently using, has no trouble falling asleep with medication but after 2 to 3 hours she is up and finds it difficult to fall back asleep.  She is retiring in 1 week and would like to be able to get some decent sleep and retirement since she will have to be waking up early to go to work.  He does practice good sleep hygiene, does not drink alcohol, denies anxiety or depression.   ROS: See pertinent positives and negatives per HPI.  Past Medical History:  Diagnosis Date   Arthritis    Constipation    GERD (gastroesophageal reflux disease)    Hyperlipidemia    Hypertension     Past Surgical History:  Procedure Laterality Date   ABDOMINAL HYSTERECTOMY     COLONOSCOPY  2017   COLPORRHAPHY     PROLAPSED UTERINE FIBROID LIGATION     x2   SALPINGOOPHORECTOMY Right     Family History  Problem Relation Age of Onset   Breast cancer Mother    Hypertension Mother    Schizophrenia Mother    Hypertension Maternal Grandmother    Diabetes Maternal Grandmother    Breast cancer Maternal Grandmother        ?   Colon cancer Neg Hx    Colon polyps Neg Hx    Esophageal cancer Neg Hx    Rectal cancer Neg Hx    Stomach cancer Neg Hx        Current Outpatient Medications:     acetaminophen (TYLENOL) 325 MG tablet, Take 650 mg by mouth every 6 (six) hours as needed., Disp: , Rfl:    amLODipine (NORVASC) 10 MG tablet, TAKE 1 TABLET EVERY DAY, Disp: 90 tablet, Rfl: 3   atorvastatin (LIPITOR) 40 MG tablet, Take 1 tablet (40 mg total) by mouth daily., Disp: 90 tablet, Rfl: 3   diclofenac sodium (VOLTAREN) 1 % GEL, Apply 2 g topically 4 (four) times daily as needed., Disp: 100 g, Rfl: 3   lisinopril-hydrochlorothiazide (ZESTORETIC) 20-25 MG tablet, TAKE 1 TABLET EVERY DAY, Disp: 90 tablet, Rfl: 3   omeprazole (PRILOSEC) 20 MG capsule, TAKE 1 CAPSULE EVERY DAY, Disp: 90 capsule, Rfl: 2  EXAM:  VITALS per patient if applicable:  GENERAL: alert, oriented, appears well and in no acute distress  HEENT: atraumatic, conjunttiva clear, no obvious abnormalities on inspection of external nose and ears  NECK: normal movements of the head and neck  LUNGS: on inspection no signs of respiratory distress, breathing rate appears normal, no obvious gross SOB, gasping or wheezing  CV: no obvious cyanosis  MS: moves all visible extremities without noticeable abnormality  PSYCH/NEURO: pleasant and cooperative, no obvious depression or anxiety, speech and thought processing grossly intact  ASSESSMENT AND PLAN:  Discussed the following assessment and plan:  1. Primary insomnia -Have her stop  Tylenol PM.  We will trial her with trazodone, take half tab for a few days to see if that works and if no improvement then can increase to a full tab.  If she continues to not be able to sleep well she will follow-up. - traZODone (DESYREL) 50 MG tablet; Take 0.5-1 tablets (25-50 mg total) by mouth at bedtime as needed for sleep.  Dispense: 90 tablet; Refill: 0       I discussed the assessment and treatment plan with the patient. The patient was provided an opportunity to ask questions and all were answered. The patient agreed with the plan and demonstrated an understanding of the  instructions.   The patient was advised to call back or seek an in-person evaluation if the symptoms worsen or if the condition fails to improve as anticipated.   Dorothyann Peng, NP

## 2020-09-19 ENCOUNTER — Telehealth: Payer: Self-pay | Admitting: Internal Medicine

## 2020-09-19 NOTE — Telephone Encounter (Signed)
PA for repatha submitted via CMM (Key: [id number])  Will need to call patient when approved, send Rx to preferred pharmacy

## 2020-09-19 NOTE — Telephone Encounter (Signed)
PA Case: [id number], Status: Approved, Coverage Starts on: 09/19/2020 12:00:00 AM, Coverage Ends on: 03/18/2021 12:00:00 AM

## 2020-09-20 MED ORDER — REPATHA SURECLICK 140 MG/ML ~~LOC~~ SOAJ: 1.0000 | SUBCUTANEOUS | 11 refills | Status: DC

## 2020-09-20 NOTE — Addendum Note (Signed)
Addended by: Fidel Levy on: 09/20/2020 03:26 PM   Modules accepted: Orders

## 2020-09-20 NOTE — Telephone Encounter (Signed)
Patient called. Notified med is approved. Rx(s) sent to pharmacy electronically. Reviewed possible SE with patient. She will discuss administration with pharmacy but advised to contact office if other assistance is needed.

## 2020-10-21 ENCOUNTER — Telehealth: Payer: Self-pay | Admitting: Internal Medicine

## 2020-10-21 DIAGNOSIS — E782 Mixed hyperlipidemia: Secondary | ICD-10-CM

## 2020-10-21 NOTE — Telephone Encounter (Signed)
*  STAT* If patient is at the pharmacy, call can be transferred to refill team.   1. Which medications need to be refilled? (please list name of each medication and dose if known) Evolocumab (REPATHA SURECLICK) 217 MG/ML SOAJ  2. Which pharmacy/location (including street and city if local pharmacy) is medication to be sent to? [place], [place] - [address]  3. Do they need a 30 day or 90 day supply? 30 day

## 2020-10-22 NOTE — Telephone Encounter (Signed)
Patient called - notified that I called [facility] and med was out of stock previously but is in stock now and they will get it ready for patient.

## 2020-10-22 NOTE — Telephone Encounter (Signed)
Medication refilled to this pharmacy on 09/20/20

## 2020-10-22 NOTE — Telephone Encounter (Signed)
Lipid panel ordered - to be done after about 3 months on repatha. Lab slip mailed

## 2020-10-22 NOTE — Addendum Note (Signed)
Addended by: Fidel Levy on: 10/22/2020 01:47 PM   Modules accepted: Orders

## 2020-10-24 ENCOUNTER — Ambulatory Visit: Payer: Medicare HMO

## 2020-10-28 ENCOUNTER — Ambulatory Visit: Payer: Medicare HMO

## 2020-11-01 ENCOUNTER — Ambulatory Visit (INDEPENDENT_AMBULATORY_CARE_PROVIDER_SITE_OTHER): Payer: Medicare HMO

## 2020-11-01 DIAGNOSIS — Z0000 Encounter for general adult medical examination without abnormal findings: Secondary | ICD-10-CM | POA: Diagnosis not present

## 2020-11-01 DIAGNOSIS — Z0100 Encounter for examination of eyes and vision without abnormal findings: Secondary | ICD-10-CM | POA: Diagnosis not present

## 2020-11-01 NOTE — Patient Instructions (Signed)
Heather Gibson , Thank you for taking time to come for your Medicare Wellness Visit. I appreciate your ongoing commitment to your health goals. Please review the following plan we discussed and let me know if I can assist you in the future.   Screening recommendations/referrals: Colonoscopy: 05/01/2019 Mammogram: 04/18/2020 Bone Density: 05/20/2015 Recommended yearly ophthalmology/optometry visit for glaucoma screening and checkup Recommended yearly dental visit for hygiene and checkup  Vaccinations: Influenza vaccine: completed  Pneumococcal vaccine: completed series  Tdap vaccine: 04/15/2016 Shingles vaccine: will consider     Advanced directives: none   Conditions/risks identified: none   Next appointment: none    Preventive Care 69 Years and Older, Female Preventive care refers to lifestyle choices and visits with your health care provider that can promote health and wellness. What does preventive care include? A yearly physical exam. This is also called an annual well check. Dental exams once or twice a year. Routine eye exams. Ask your health care provider how often you should have your eyes checked. Personal lifestyle choices, including: Daily care of your teeth and gums. Regular physical activity. Eating a healthy diet. Avoiding tobacco and drug use. Limiting alcohol use. Practicing safe sex. Taking low-dose aspirin every day. Taking vitamin and mineral supplements as recommended by your health care provider. What happens during an annual well check? The services and screenings done by your health care provider during your annual well check will depend on your age, overall health, lifestyle risk factors, and family history of disease. Counseling  Your health care provider may ask you questions about your: Alcohol use. Tobacco use. Drug use. Emotional well-being. Home and relationship well-being. Sexual activity. Eating habits. History of falls. Memory and  ability to understand (cognition). Work and work Statistician. Reproductive health. Screening  You may have the following tests or measurements: Height, weight, and BMI. Blood pressure. Lipid and cholesterol levels. These may be checked every 5 years, or more frequently if you are over 17 years old. Skin check. Lung cancer screening. You may have this screening every year starting at age 77 if you have a 30-pack-year history of smoking and currently smoke or have quit within the past 15 years. Fecal occult blood test (FOBT) of the stool. You may have this test every year starting at age 57. Flexible sigmoidoscopy or colonoscopy. You may have a sigmoidoscopy every 5 years or a colonoscopy every 10 years starting at age 57. Hepatitis C blood test. Hepatitis B blood test. Sexually transmitted disease (STD) testing. Diabetes screening. This is done by checking your blood sugar (glucose) after you have not eaten for a while (fasting). You may have this done every 1-3 years. Bone density scan. This is done to screen for osteoporosis. You may have this done starting at age 91. Mammogram. This may be done every 1-2 years. Talk to your health care provider about how often you should have regular mammograms. Talk with your health care provider about your test results, treatment options, and if necessary, the need for more tests. Vaccines  Your health care provider may recommend certain vaccines, such as: Influenza vaccine. This is recommended every year. Tetanus, diphtheria, and acellular pertussis (Tdap, Td) vaccine. You may need a Td booster every 10 years. Zoster vaccine. You may need this after age 72. Pneumococcal 13-valent conjugate (PCV13) vaccine. One dose is recommended after age 26. Pneumococcal polysaccharide (PPSV23) vaccine. One dose is recommended after age 18. Talk to your health care provider about which screenings and vaccines you need and how  often you need them. This information is  not intended to replace advice given to you by your health care provider. Make sure you discuss any questions you have with your health care provider. Document Released: 01/18/2015 Document Revised: 09/11/2015 Document Reviewed: 10/23/2014 Elsevier Interactive Patient Education  2017 [place] Prevention in the Home Falls can cause injuries. They can happen to people of all ages. There are many things you can do to make your home safe and to help prevent falls. What can I do on the outside of my home? Regularly fix the edges of walkways and driveways and fix any cracks. Remove anything that might make you trip as you walk through a door, such as a raised step or threshold. Trim any bushes or trees on the path to your home. Use bright outdoor lighting. Clear any walking paths of anything that might make someone trip, such as rocks or tools. Regularly check to see if handrails are loose or broken. Make sure that both sides of any steps have handrails. Any raised decks and porches should have guardrails on the edges. Have any leaves, snow, or ice cleared regularly. Use sand or salt on walking paths during winter. Clean up any spills in your garage right away. This includes oil or grease spills. What can I do in the bathroom? Use night lights. Install grab bars by the toilet and in the tub and shower. Do not use towel bars as grab bars. Use non-skid mats or decals in the tub or shower. If you need to sit down in the shower, use a plastic, non-slip stool. Keep the floor dry. Clean up any water that spills on the floor as soon as it happens. Remove soap buildup in the tub or shower regularly. Attach bath mats securely with double-sided non-slip rug tape. Do not have throw rugs and other things on the floor that can make you trip. What can I do in the bedroom? Use night lights. Make sure that you have a light by your bed that is easy to reach. Do not use any sheets or blankets that  are too big for your bed. They should not hang down onto the floor. Have a firm chair that has side arms. You can use this for support while you get dressed. Do not have throw rugs and other things on the floor that can make you trip. What can I do in the kitchen? Clean up any spills right away. Avoid walking on wet floors. Keep items that you use a lot in easy-to-reach places. If you need to reach something above you, use a strong step stool that has a grab bar. Keep electrical cords out of the way. Do not use floor polish or wax that makes floors slippery. If you must use wax, use non-skid floor wax. Do not have throw rugs and other things on the floor that can make you trip. What can I do with my stairs? Do not leave any items on the stairs. Make sure that there are handrails on both sides of the stairs and use them. Fix handrails that are broken or loose. Make sure that handrails are as long as the stairways. Check any carpeting to make sure that it is firmly attached to the stairs. Fix any carpet that is loose or worn. Avoid having throw rugs at the top or bottom of the stairs. If you do have throw rugs, attach them to the floor with carpet tape. Make sure that you have a  light switch at the top of the stairs and the bottom of the stairs. If you do not have them, ask someone to add them for you. What else can I do to help prevent falls? Wear shoes that: Do not have high heels. Have rubber bottoms. Are comfortable and fit you well. Are closed at the toe. Do not wear sandals. If you use a stepladder: Make sure that it is fully opened. Do not climb a closed stepladder. Make sure that both sides of the stepladder are locked into place. Ask someone to hold it for you, if possible. Clearly mark and make sure that you can see: Any grab bars or handrails. First and last steps. Where the edge of each step is. Use tools that help you move around (mobility aids) if they are needed. These  include: Canes. Walkers. Scooters. Crutches. Turn on the lights when you go into a dark area. Replace any light bulbs as soon as they burn out. Set up your furniture so you have a clear path. Avoid moving your furniture around. If any of your floors are uneven, fix them. If there are any pets around you, be aware of where they are. Review your medicines with your doctor. Some medicines can make you feel dizzy. This can increase your chance of falling. Ask your doctor what other things that you can do to help prevent falls. This information is not intended to replace advice given to you by your health care provider. Make sure you discuss any questions you have with your health care provider. Document Released: 10/18/2008 Document Revised: 05/30/2015 Document Reviewed: 01/26/2014 Elsevier Interactive Patient Education  2017 [employer].

## 2020-11-01 NOTE — Progress Notes (Signed)
Subjective:   Heather Gibson is a 70 y.o. female who presents for Medicare Annual (Subsequent) preventive examination.  I connected with [name] today by telephone and verified that I am speaking with the correct person using two identifiers. Location patient: home Location provider: work Persons participating in the virtual visit: patient, provider.   I discussed the limitations, risks, security and privacy concerns of performing an evaluation and management service by telephone and the availability of in person appointments. I also discussed with the patient that there may be a patient responsible charge related to this service. The patient expressed understanding and verbally consented to this telephonic visit.    Interactive audio and video telecommunications were attempted between this provider and patient, however failed, due to patient having technical difficulties OR patient did not have access to video capability.  We continued and completed visit with audio only.    Review of Systems     Cardiac Risk Factors include: advanced age (>77men, >45 women);dyslipidemia;hypertension     Objective:    Today's Vitals   There is no height or weight on file to calculate BMI.  Advanced Directives 11/01/2020 10/27/2019 07/11/2019 04/05/2015  Does Patient Have a Medical Advance Directive? No No No No  Would patient like information on creating a medical advance directive? No - Patient declined No - Patient declined No - Patient declined -    Current Medications (verified) Outpatient Encounter Medications as of 11/01/2020  Medication Sig   acetaminophen (TYLENOL) 325 MG tablet Take 650 mg by mouth every 6 (six) hours as needed.   amLODipine (NORVASC) 10 MG tablet TAKE 1 TABLET EVERY DAY   atorvastatin (LIPITOR) 40 MG tablet Take 1 tablet (40 mg total) by mouth daily.   diclofenac sodium (VOLTAREN) 1 % GEL Apply 2 g topically 4 (four) times daily as needed.   Evolocumab  (REPATHA SURECLICK) 979 MG/ML SOAJ Inject 1 Dose into the skin every 14 (fourteen) days.   lisinopril-hydrochlorothiazide (ZESTORETIC) 20-25 MG tablet TAKE 1 TABLET EVERY DAY   omeprazole (PRILOSEC) 20 MG capsule TAKE 1 CAPSULE EVERY DAY   traZODone (DESYREL) 50 MG tablet Take 0.5-1 tablets (25-50 mg total) by mouth at bedtime as needed for sleep.   No facility-administered encounter medications on file as of 11/01/2020.    Allergies (verified) Crestor [rosuvastatin], Eggs or egg-derived products, Latex, Milk-related compounds, Peanut-containing drug products, Tramadol, and Shellfish allergy   History: Past Medical History:  Diagnosis Date   Arthritis    Constipation    GERD (gastroesophageal reflux disease)    Hyperlipidemia    Hypertension    Past Surgical History:  Procedure Laterality Date   ABDOMINAL HYSTERECTOMY     COLONOSCOPY  2017   COLPORRHAPHY     PROLAPSED UTERINE FIBROID LIGATION     x2   SALPINGOOPHORECTOMY Right    Family History  Problem Relation Age of Onset   Breast cancer Mother    Hypertension Mother    Schizophrenia Mother    Hypertension Maternal Grandmother    Diabetes Maternal Grandmother    Breast cancer Maternal Grandmother        ?   Colon cancer Neg Hx    Colon polyps Neg Hx    Esophageal cancer Neg Hx    Rectal cancer Neg Hx    Stomach cancer Neg Hx    Social History   Socioeconomic History   Marital status: Married    Spouse name: Not on file   Number of children: Not  on file   Years of education: Not on file   Highest education level: Not on file  Occupational History   Not on file  Tobacco Use   Smoking status: Never   Smokeless tobacco: Never  Vaping Use   Vaping Use: Never used  Substance and Sexual Activity   Alcohol use: No   Drug use: No   Sexual activity: Not Currently    Birth control/protection: Post-menopausal  Other Topics Concern   Not on file  Social History Narrative   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   Married for 14 years   Three Children ( all live locally)    Social Determinants of Radio broadcast assistant Strain: Low Risk    Difficulty of Paying Living Expenses: Not hard at all  Food Insecurity: No Food Insecurity   Worried About Charity fundraiser in the Last Year: Never true   Arboriculturist in the Last Year: Never true  Transportation Needs: No Transportation Needs   Lack of Transportation (Medical): No   Lack of Transportation (Non-Medical): No  Physical Activity: Insufficiently Active   Days of Exercise per Week: 5 days   Minutes of Exercise per Session: 10 min  Stress: No Stress Concern Present   Feeling of Stress : Not at all  Social Connections: Moderately Isolated   Frequency of Communication with Friends and Family: Twice a week   Frequency of Social Gatherings with Friends and Family: Twice a week   Attends Religious Services: More than 4 times per year   Active Member of [employer] or Organizations: No   Attends Music therapist: Never   Marital Status: Separated    Tobacco Counseling Counseling given: Not Answered   Clinical Intake:  Pre-visit preparation completed: Yes  Pain : No/denies pain     Nutritional Risks: None Diabetes: No  How often do you need to have someone help you when you read instructions, pamphlets, or other written materials from your doctor or pharmacy?: 1 - Never What is the last grade level you completed in school?: [place]  Interpreter Needed?: No  Information entered by :: L.Frisco Cordts,LPN   Activities of Daily Living In your present state of health, do you have any difficulty performing the following activities: 11/01/2020  Hearing? N  Vision? N  Difficulty concentrating or making decisions? N  Walking or climbing stairs? N  Dressing or bathing? N  Doing errands, shopping? N  Preparing Food and eating ? N  Using the Toilet? N  In the past six months, have  you accidently leaked urine? N  Do you have problems with loss of bowel control? N  Managing your Medications? N  Managing your Finances? N  Housekeeping or managing your Housekeeping? N  Some recent data might be hidden    Patient Care Team: Dorothyann Peng, NP as PCP - General (Family Medicine)  Indicate any recent Medical Services you may have received from other than [facility] providers in the past year (date may be approximate).     Assessment:   This is a routine wellness examination for [place].  Hearing/Vision screen Vision Screening - Comments:: Referral 11/01/2020  Dietary issues and exercise activities discussed: Current Exercise Habits: Home exercise routine, Type of exercise: walking, Time (Minutes): 10, Frequency (Times/Week): 3, Weekly Exercise (Minutes/Week): 30, Intensity: Mild, Exercise limited by: orthopedic condition(s)   Goals Addressed  This Visit's Progress    Exercise 3x per week (30 min per time)   On track      Depression Screen PHQ 2/9 Scores 11/01/2020 11/01/2020 10/27/2019 09/13/2019 06/16/2017  PHQ - 2 Score 0 0 0 0 0  PHQ- 9 Score - - 0 - -    Fall Risk Fall Risk  11/01/2020 10/27/2019 09/13/2019 06/16/2017 06/16/2017  Falls in the past year? 0 0 0 Yes Yes  Number falls in past yr: 0 0 0 1 1  Injury with Fall? 0 0 0 Yes -  Comment - - - right shoulder injury  -  Risk for fall due to : - No Fall Risks - - -  Follow up Falls evaluation completed Falls evaluation completed;Falls prevention discussed Falls evaluation completed Falls evaluation completed;Falls prevention discussed;Education provided;Follow up appointment -    FALL RISK PREVENTION PERTAINING TO THE HOME:  Any stairs in or around the home? No  If so, are there any without handrails? No  Home free of loose throw rugs in walkways, pet beds, electrical cords, etc? Yes  Adequate lighting in your home to reduce risk of falls? Yes   ASSISTIVE DEVICES UTILIZED TO PREVENT  FALLS:  Life alert? No  Use of a cane, walker or w/c? Yes  Grab bars in the bathroom? Yes  Shower chair or bench in shower? No  Elevated toilet seat or a handicapped toilet? Yes    Cognitive Function:    Normal cognitive status assessed by direct observation by this Nurse Health Advisor. No abnormalities found.      Immunizations Immunization History  Administered Date(s) Administered   Fluad Quad(high Dose 65+) 12/28/2018, 10/25/2019   Influenza, High Dose Seasonal PF 10/13/2017   Influenza,inj,Quad PF,6+ Mos 01/20/2016, 10/22/2016   PFIZER Comirnaty(Gray Top)Covid-19 Tri-Sucrose Vaccine 01/30/2020   PFIZER(Purple Top)SARS-COV-2 Vaccination 02/25/2019, 03/21/2019   Pneumococcal Conjugate-13 06/16/2017   Pneumococcal Polysaccharide-23 08/11/2005, 10/25/2019   Td 05/09/2003   Tdap 04/15/2016    TDAP status: Up to date  Flu Vaccine status: Up to date  Pneumococcal vaccine status: Up to date  Covid-19 vaccine status: Completed vaccines  Qualifies for Shingles Vaccine? Yes   Zostavax completed No   Shingrix Completed?: No.    Education has been provided regarding the importance of this vaccine. Patient has been advised to call insurance company to determine out of pocket expense if they have not yet received this vaccine. Advised may also receive vaccine at local pharmacy or Health Dept. Verbalized acceptance and understanding.  Screening Tests Health Maintenance  Topic Date Due   Zoster Vaccines- Shingrix (1 of 2) Never done   COVID-19 Vaccine (4 - Booster for Pfizer series) 03/26/2020   INFLUENZA VACCINE  08/05/2020   MAMMOGRAM  04/18/2021   COLONOSCOPY (Pts 45-81yrs Insurance coverage will need to be confirmed)  05/01/2022   TETANUS/TDAP  04/16/2026   Pneumonia Vaccine 64+ Years old  Completed   DEXA SCAN  Completed   Hepatitis C Screening  Completed   HPV VACCINES  Aged Out    Health Maintenance  Health Maintenance Due  Topic Date Due   Zoster Vaccines-  Shingrix (1 of 2) Never done   COVID-19 Vaccine (4 - Booster for Pfizer series) 03/26/2020   INFLUENZA VACCINE  08/05/2020    Colorectal cancer screening: Type of screening: Colonoscopy. Completed 05/01/2019. Repeat every 3 years  Mammogram status: Completed 04/18/2020. Repeat every year  Bone Density status: Completed 05/20/2015. Results reflect: Bone density results: OSTEOPENIA. Repeat every 5 years.  Lung Cancer Screening: (Low Dose CT Chest recommended if Age 80-80 years, 30 pack-year currently smoking OR have quit w/in 15years.) does not qualify.   Lung Cancer Screening Referral: n/a  Additional Screening:  Hepatitis C Screening: does not qualify; Completed 06/16/2017  Vision Screening: Recommended annual ophthalmology exams for early detection of glaucoma and other disorders of the eye. Is the patient up to date with their annual eye exam?  No  Who is the provider or what is the name of the office in which the patient attends annual eye exams? Referral completed 11/01/2020 If pt is not established with a provider, would they like to be referred to a provider to establish care? Yes .   Dental Screening: Recommended annual dental exams for proper oral hygiene  Community Resource Referral / Chronic Care Management: CRR required this visit?  No   CCM required this visit?  No      Plan:     I have personally reviewed and noted the following in the patient's chart:   Medical and social history Use of alcohol, tobacco or illicit drugs  Current medications and supplements including opioid prescriptions.  Functional ability and status Nutritional status Physical activity Advanced directives List of other physicians Hospitalizations, surgeries, and ER visits in previous 12 months Vitals Screenings to include cognitive, depression, and falls Referrals and appointments  In addition, I have reviewed and discussed with patient certain preventive protocols, quality metrics,  and best practice recommendations. A written personalized care plan for preventive services as well as general preventive health recommendations were provided to patient.     Randel Pigg, LPN   [id number]   Nurse Notes: none

## 2020-11-05 ENCOUNTER — Telehealth: Payer: Self-pay | Admitting: Adult Health

## 2020-11-05 NOTE — Telephone Encounter (Signed)
Patient states she has began itching again and is wondering if she can get a refill on methylPREDNISolone (MEDROL DOSEPAK) 4 MG TBPK tablet     Please send to  [facility], [place]  AT [address] Phone:  [phone]  Fax:  [phone]       Please advise

## 2020-11-06 ENCOUNTER — Other Ambulatory Visit: Payer: Self-pay | Admitting: Adult Health

## 2020-11-06 MED ORDER — METHYLPREDNISOLONE 4 MG PO TBPK: ORAL_TABLET | ORAL | 0 refills | Status: DC

## 2020-11-06 NOTE — Telephone Encounter (Signed)
Patient notified of update  and verbalized understanding. 

## 2020-11-06 NOTE — Telephone Encounter (Signed)
Please advise 

## 2020-12-16 ENCOUNTER — Other Ambulatory Visit: Payer: Self-pay | Admitting: Adult Health

## 2020-12-16 DIAGNOSIS — F5101 Primary insomnia: Secondary | ICD-10-CM

## 2020-12-18 ENCOUNTER — Telehealth: Payer: Self-pay | Admitting: Adult Health

## 2020-12-18 NOTE — Telephone Encounter (Signed)
Patient calling in with respiratory symptoms: Shortness of breath, chest pain, palpitations or other red words send to Triage  Does the patient have a fever over 100, cough, congestion, sore throat, runny nose, lost of taste/smell (please list symptoms that patient has)?runny nose and sneezing x 2 days  What date did symptoms start?12-16-2020 (If over 5 days ago, pt may be scheduled for in person visit)  Have you tested for Covid in the last 5 days? No   If yes, was it positive []  OR negative [] ? If positive in the last 5 days, please schedule virtual visit now. If negative, schedule for an in person OV with the next available provider if PCP has no openings. Please also let patient know they will be tested again (follow the script below)  "you will have to arrive 22mins prior to your appt time to be Covid tested. [facility] back [facility] & call [phone] to let the staff know you have arrived. A staff member will meet you at your car to do a rapid covid test. Once the test has resulted you will be notified by phone of your results to determine if appt will remain an in person visit or be converted to a virtual/phone visit. If you arrive less than 75mins before your appt time, your visit will be automatically converted to virtual & any recommended testing will happen AFTER the visit."  Pt has virtual with cory on 12-19-2020 THINGS TO REMEMBER  If no availability for virtual visit in office,  please schedule another Post office  If no availability at another [place] office, please instruct patient that they can schedule an evisit or virtual visit through their mychart account. Visits up to 8pm  patients can be seen in office 5 days after positive COVID test

## 2020-12-19 ENCOUNTER — Telehealth (INDEPENDENT_AMBULATORY_CARE_PROVIDER_SITE_OTHER): Payer: Medicare HMO | Admitting: Adult Health

## 2020-12-19 ENCOUNTER — Encounter: Payer: Self-pay | Admitting: Adult Health

## 2020-12-19 VITALS — Ht 66.0 in | Wt 219.0 lb

## 2020-12-19 DIAGNOSIS — B9789 Other viral agents as the cause of diseases classified elsewhere: Secondary | ICD-10-CM | POA: Diagnosis not present

## 2020-12-19 DIAGNOSIS — J019 Acute sinusitis, unspecified: Secondary | ICD-10-CM

## 2020-12-19 NOTE — Progress Notes (Signed)
Virtual Visit via Telephone Note  I connected with Heather Gibson on 12/19/20 at 11:30 AM EST by telephone and verified that I am speaking with the correct person using two identifiers.   I discussed the limitations, risks, security and privacy concerns of performing an evaluation and management service by telephone and the availability of in person appointments. I also discussed with the patient that there may be a patient responsible charge related to this service. The patient expressed understanding and agreed to proceed.  Location patient: home Location provider: work or home office Participants present for the call: patient, provider Patient did not have a visit in the prior 7 days to address this/these issue(s).   History of Present Illness: 70 year old female who  has a past medical history of Arthritis, Constipation, GERD (gastroesophageal reflux disease), Hyperlipidemia, and Hypertension.  She is being evaluated today for an acute issue.  Her symptoms started 3 to 4 days ago.  Symptoms include sneezing and a runny nose with clear drainage.  She did have a headache earlier in the week but this is since resolved.  She denies fevers, chills, sinus pain or pressure.  Does feel as though her symptoms are improving slightly.  Has been using over-the-counter sinus medication which helps.   Observations/Objective: Patient sounds cheerful and well on the phone. I do not appreciate any SOB. Speech and thought processing are grossly intact. Patient reported vitals:  Assessment and Plan: 1. Acute viral sinusitis - advised likely viral or allergy  - Can continue with home medications.  - Add flonase  - Follow up as needed  Dorothyann Peng, NP    Follow Up Instructions:  DIAGMED@   [id number] [id number] [id number] 11-20 [id number] 21-30 I did not refer this patient for an OV in the next 24 hours for this/these issue(s).  I discussed the assessment and treatment plan with the patient. The  patient was provided an opportunity to ask questions and all were answered. The patient agreed with the plan and demonstrated an understanding of the instructions.   The patient was advised to call back or seek an in-person evaluation if the symptoms worsen or if the condition fails to improve as anticipated.  I provided 15 minutes of non-face-to-face time during this encounter.   Dorothyann Peng, NP

## 2021-01-03 DIAGNOSIS — E782 Mixed hyperlipidemia: Secondary | ICD-10-CM | POA: Diagnosis not present

## 2021-01-03 LAB — LIPID PANEL
Chol/HDL Ratio: 2.2 ratio (ref 0.0–4.4)
Cholesterol, Total: 123 mg/dL (ref 100–199)
HDL: 57 mg/dL (ref >39)
LDL Chol Calc (NIH): 51 mg/dL (ref 0–99)
Triglycerides: 73 mg/dL (ref 0–149)
VLDL Cholesterol Cal: 15 mg/dL (ref 5–40)

## 2021-01-20 ENCOUNTER — Telehealth: Payer: Self-pay | Admitting: Adult Health

## 2021-01-20 NOTE — Telephone Encounter (Signed)
Patient called requesting a medication that would help with her upper respiratory infection. Patient is still experiencing a sore throat and ear stopped up from last visit on 12/19/20. Patient stated that Tommi Rumps advised her to call back to get medication if the symptoms doesn't go away. Patient took a covid test and it was negative.  Patient could be contacted at [phone].  Please advise.

## 2021-01-22 ENCOUNTER — Other Ambulatory Visit: Payer: Self-pay | Admitting: Adult Health

## 2021-01-22 MED ORDER — AMOXICILLIN-POT CLAVULANATE 875-125 MG PO TABS: 1.0000 | ORAL_TABLET | Freq: Two times a day (BID) | ORAL | 0 refills | Status: DC

## 2021-01-22 NOTE — Telephone Encounter (Signed)
Patient notified of update  and verbalized understanding. 

## 2021-01-22 NOTE — Telephone Encounter (Signed)
Please advise 

## 2021-02-05 ENCOUNTER — Other Ambulatory Visit: Payer: Self-pay | Admitting: Adult Health

## 2021-02-05 DIAGNOSIS — I10 Essential (primary) hypertension: Secondary | ICD-10-CM

## 2021-02-22 ENCOUNTER — Other Ambulatory Visit: Payer: Self-pay | Admitting: Adult Health

## 2021-02-22 DIAGNOSIS — F5101 Primary insomnia: Secondary | ICD-10-CM

## 2021-02-24 NOTE — Telephone Encounter (Signed)
Rx sent in for 30 days Patient need to schedule a CPE for more refills.

## 2021-02-25 ENCOUNTER — Telehealth: Payer: Self-pay | Admitting: Internal Medicine

## 2021-02-25 NOTE — Telephone Encounter (Signed)
Repatha PA submitted via CMM PA Case: [id number], Status: Approved, Coverage Starts on: 01/05/2021 12:00:00 AM, Coverage Ends on: 01/04/2022 12:00:00 AM

## 2021-03-04 ENCOUNTER — Encounter (HOSPITAL_BASED_OUTPATIENT_CLINIC_OR_DEPARTMENT_OTHER): Payer: Self-pay | Admitting: Internal Medicine

## 2021-03-04 ENCOUNTER — Ambulatory Visit (HOSPITAL_BASED_OUTPATIENT_CLINIC_OR_DEPARTMENT_OTHER): Payer: Medicare HMO | Admitting: Internal Medicine

## 2021-03-04 ENCOUNTER — Other Ambulatory Visit: Payer: Self-pay

## 2021-03-04 VITALS — BP 140/80 | HR 73 | Ht 66.0 in | Wt 224.8 lb

## 2021-03-04 DIAGNOSIS — E7801 Familial hypercholesterolemia: Secondary | ICD-10-CM | POA: Diagnosis not present

## 2021-03-04 DIAGNOSIS — I700 Atherosclerosis of aorta: Secondary | ICD-10-CM | POA: Diagnosis not present

## 2021-03-04 DIAGNOSIS — R931 Abnormal findings on diagnostic imaging of heart and coronary circulation: Secondary | ICD-10-CM | POA: Diagnosis not present

## 2021-03-04 DIAGNOSIS — M791 Myalgia, unspecified site: Secondary | ICD-10-CM | POA: Diagnosis not present

## 2021-03-04 DIAGNOSIS — T466X5D Adverse effect of antihyperlipidemic and antiarteriosclerotic drugs, subsequent encounter: Secondary | ICD-10-CM

## 2021-03-04 DIAGNOSIS — I10 Essential (primary) hypertension: Secondary | ICD-10-CM | POA: Diagnosis not present

## 2021-03-04 DIAGNOSIS — T466X5A Adverse effect of antihyperlipidemic and antiarteriosclerotic drugs, initial encounter: Secondary | ICD-10-CM

## 2021-03-04 NOTE — Progress Notes (Signed)
LIPID CLINIC CONSULT NOTE  Chief Complaint:  Follow-up dyslipidemia  Primary Care Physician: Dorothyann Peng, NP  Primary Cardiologist:  None  HPI:  Heather Gibson is a 71 y.o. female who is being seen today for the evaluation of dyslipidemia at the request of Dorothyann Peng, NP. This is a pleasant 71 year old female kindly referred for management of dyslipidemia.  She has no family history of heart disease in her mother died of pneumonia at 74 and her father had no heart disease that she is aware of.  There is hypertension in the family and she is well treated with hypertension.  She has a history of longstanding dyslipidemia with LDL cholesterol times over 200 although most simply her LDL was 179, total cholesterol 269, HDL of 59 and triglycerides of 160.  In the past she has been tried on statins including rosuvastatin which caused some myalgias and therefore she discontinued it.  She is currently on no therapies.  She reports a varied but mostly Southern diet with some fried foods although does like vegetables and tries to avoid certain fats and uses all of oil.  Overall she is pretty sedentary.  She has no known history of coronary disease.  08/23/2020  Heather Gibson returns today for follow-up.  She has been taking atorvastatin 40 mg nightly for the past 90 days.  She recently refilled her medicine.  She says she is tolerating it well without any significant side effects.  She has not had her cholesterol retested and we will plan to do that today.  I had also ordered a calcium score however that was not obtained either.  Is not clear what happened but she is interested in the study and we will go ahead and try to schedule that today.  03/04/2021  Heather Gibson is seen today in follow-up for dyslipidemia.  She is doing well with Repatha and seems to tolerate it.  She is also on atorvastatin.  She has had additional lipid lowering which is substantial.  Total cholesterol now 123,  triglycerides 73, HDL 57 LDL 51.  This is down from 139 previously.  Calcium scoring as mentioned previously showed calcium score of 229, 89th percentile for age and sex matched controls.  There was aortic atherosclerosis, mitral annular calcification and some evidence of pulmonary artery enlargement.  She is accompanied by her daughter today who informed me that she also has high cholesterol.  We discussed the importance of lipid-lowering aggressively to prevent coronary disease and she is aware of it.  PMHx:  Past Medical History:  Diagnosis Date   Arthritis    Constipation    GERD (gastroesophageal reflux disease)    Hyperlipidemia    Hypertension     Past Surgical History:  Procedure Laterality Date   ABDOMINAL HYSTERECTOMY     COLONOSCOPY  2017   COLPORRHAPHY     PROLAPSED UTERINE FIBROID LIGATION     x2   SALPINGOOPHORECTOMY Right     FAMHx:  Family History  Problem Relation Age of Onset   Breast cancer Mother    Hypertension Mother    Schizophrenia Mother    Hypertension Maternal Grandmother    Diabetes Maternal Grandmother    Breast cancer Maternal Grandmother        ?   Colon cancer Neg Hx    Colon polyps Neg Hx    Esophageal cancer Neg Hx    Rectal cancer Neg Hx    Stomach cancer Neg Hx  SOCHx:   reports that she has never smoked. She has never used smokeless tobacco. She reports that she does not drink alcohol and does not use drugs.  ALLERGIES:  Allergies  Allergen Reactions   Crestor [Rosuvastatin] Other (See Comments)    Myalgia    Eggs Or Egg-Derived Products     Years ago dx- eats egg now without any problems   Latex Swelling   Milk-Related Compounds     Cannot have large amt of milk products   Peanut-Containing Drug Products     Cannot have large amts of   Tramadol Itching   Shellfish Allergy Itching    ROS: Pertinent items noted in HPI and remainder of comprehensive ROS otherwise negative.  HOME MEDS: Current Outpatient Medications on  File Prior to Visit  Medication Sig Dispense Refill   acetaminophen (TYLENOL) 325 MG tablet Take 650 mg by mouth every 6 (six) hours as needed.     amLODipine (NORVASC) 10 MG tablet TAKE 1 TABLET EVERY DAY 90 tablet 0   atorvastatin (LIPITOR) 40 MG tablet Take 1 tablet (40 mg total) by mouth daily. 90 tablet 3   diclofenac sodium (VOLTAREN) 1 % GEL Apply 2 g topically 4 (four) times daily as needed. 100 g 3   Evolocumab (REPATHA SURECLICK) 627 MG/ML SOAJ Inject 1 Dose into the skin every 14 (fourteen) days. 2 mL 11   lisinopril-hydrochlorothiazide (ZESTORETIC) 20-25 MG tablet TAKE 1 TABLET EVERY DAY 90 tablet 0   omeprazole (PRILOSEC) 20 MG capsule TAKE 1 CAPSULE EVERY DAY 90 capsule 2   traZODone (DESYREL) 50 MG tablet TAKE 1/2 TO 1 TABLET(25 TO 50 MG) BY MOUTH AT BEDTIME AS NEEDED FOR SLEEP 30 tablet 0   No current facility-administered medications on file prior to visit.    LABS/IMAGING: No results found for this or any previous visit (from the past 48 hour(s)). No results found.  LIPID PANEL:    Component Value Date/Time   CHOL 123 01/03/2021 0914   TRIG 73 01/03/2021 0914   HDL 57 01/03/2021 0914   CHOLHDL 2.2 01/03/2021 0914   CHOLHDL 5 04/12/2020 0855   VLDL 32.8 04/12/2020 0855   LDLCALC 51 01/03/2021 0914    WEIGHTS: Wt Readings from Last 3 Encounters:  03/04/21 224 lb 12.8 oz (102 kg)  12/19/20 219 lb (99.3 kg)  09/18/20 219 lb (99.3 kg)    VITALS: BP 140/80    Pulse 73    Ht 5\' 6"  (1.676 m)    Wt 224 lb 12.8 oz (102 kg)    SpO2 97%    BMI 36.28 kg/m   EXAM: Deferred  EKG: Deferred  ASSESSMENT: Mixed dyslipidemia, suspect familial hyperlipidemia Hypertension-controlled Myalgia to Crestor Aortic atherosclerosis Mitral annular calcification Abnormal CAC score of 229, 89th percentile (09/2020)  PLAN: 1.   Heather Gibson has had additional reduction in her cholesterol on Repatha plus high intensity atorvastatin.  Based on these numbers I suspect she has a  familial hyperlipidemia.  She has responded very well to therapy and now is at a low LDL less than 55.  We will continue current therapies.  I have also advised starting low-dose aspirin given her high percentile calcium score and evidence for aortic atherosclerosis.  Plan follow-up with me annually or sooner as necessary.  Pixie Casino, MD, [facility], [place] Director of the Advanced Lipid Disorders &  Cardiovascular Risk Reduction Clinic Diplomate of the American Board of Clinical Lipidology Attending  Cardiologist  Direct Dial: [phone]   Fax: [phone]  Website:  [email or web address] Servando Kyllonen 03/04/2021, 10:26 AM

## 2021-03-04 NOTE — Patient Instructions (Signed)
Medication Instructions:  START aspirin 81mg  daily  CONTINUE all other current medications   *If you need a refill on your cardiac medications before your next appointment, please call your pharmacy*   Lab Work: FASTING lab work to check cholesterol in 1 year   If you have labs (blood work) drawn today and your tests are completely normal, you will receive your results only by: [place] (if you have MyChart) OR A paper copy in the mail If you have any lab test that is abnormal or we need to change your treatment, we will call you to review the results.   Testing/Procedures: NONE   Follow-Up: At [facility], you and your health needs are our priority.  As part of our continuing mission to provide you with exceptional heart care, we have created designated Provider Care Teams.  These Care Teams include your primary Cardiologist (physician) and Advanced Practice Providers (APPs -  Physician Assistants and Nurse Practitioners) who all work together to provide you with the care you need, when you need it.  We recommend signing up for the patient portal called "MyChart".  Sign up information is provided on this After Visit Summary.  MyChart is used to connect with patients for Virtual Visits (Telemedicine).  Patients are able to view lab/test results, encounter notes, upcoming appointments, etc.  Non-urgent messages can be sent to your provider as well.   To learn more about what you can do with MyChart, go to [email or web address].    Your next appointment:    1 year with Dr. Debara Pickett

## 2021-03-05 DIAGNOSIS — R69 Illness, unspecified: Secondary | ICD-10-CM | POA: Diagnosis not present

## 2021-03-06 ENCOUNTER — Ambulatory Visit: Payer: Medicare HMO | Admitting: Internal Medicine

## 2021-03-18 ENCOUNTER — Other Ambulatory Visit: Payer: Self-pay | Admitting: Adult Health

## 2021-03-18 DIAGNOSIS — Z1231 Encounter for screening mammogram for malignant neoplasm of breast: Secondary | ICD-10-CM

## 2021-03-20 ENCOUNTER — Other Ambulatory Visit: Payer: Self-pay | Admitting: Adult Health

## 2021-03-20 ENCOUNTER — Telehealth: Payer: Self-pay

## 2021-03-20 NOTE — Telephone Encounter (Signed)
Please advise 

## 2021-03-20 NOTE — Telephone Encounter (Signed)
Patient called stating that she is taking 2 tablets of the Trazodone and patient would like to know if a high mg can be given or more than 30 tablets she also asked if another Rx for a different medication is possible. ?

## 2021-03-22 ENCOUNTER — Other Ambulatory Visit: Payer: Self-pay | Admitting: Adult Health

## 2021-03-22 DIAGNOSIS — F5101 Primary insomnia: Secondary | ICD-10-CM

## 2021-03-24 NOTE — Telephone Encounter (Signed)
Spoke to pt and she stated she will like to try the '100mg'$ .  ?

## 2021-03-25 ENCOUNTER — Other Ambulatory Visit: Payer: Self-pay | Admitting: Adult Health

## 2021-03-25 MED ORDER — TRAZODONE HCL 100 MG PO TABS: 100.0000 mg | ORAL_TABLET | Freq: Every day | ORAL | 1 refills | Status: DC

## 2021-03-26 DIAGNOSIS — R69 Illness, unspecified: Secondary | ICD-10-CM | POA: Diagnosis not present

## 2021-03-26 NOTE — Telephone Encounter (Signed)
Patient notified that new Rx was sent to the pharmacy and verbalized understanding. ?

## 2021-04-21 ENCOUNTER — Ambulatory Visit
Admission: RE | Admit: 2021-04-21 | Discharge: 2021-04-21 | Disposition: A | Discharge status: Home / Self Care | Payer: Medicare HMO | Source: Ambulatory Visit | Attending: Adult Health | Admitting: Adult Health

## 2021-04-21 DIAGNOSIS — Z1231 Encounter for screening mammogram for malignant neoplasm of breast: Secondary | ICD-10-CM

## 2021-04-22 ENCOUNTER — Telehealth: Payer: Self-pay | Admitting: Adult Health

## 2021-04-22 ENCOUNTER — Other Ambulatory Visit: Payer: Self-pay | Admitting: Adult Health

## 2021-04-22 DIAGNOSIS — R928 Other abnormal and inconclusive findings on diagnostic imaging of breast: Secondary | ICD-10-CM

## 2021-04-22 NOTE — Telephone Encounter (Signed)
Patient would like a callback with her mammogram results. She states that she spoke with the [facility] center and they stated that they sent the results over yesterday. ? ? ? ? ? ?Please advise  ?

## 2021-04-23 ENCOUNTER — Other Ambulatory Visit: Payer: Self-pay | Admitting: Adult Health

## 2021-04-23 DIAGNOSIS — K219 Gastro-esophageal reflux disease without esophagitis: Secondary | ICD-10-CM

## 2021-04-23 NOTE — Telephone Encounter (Signed)
Please advise 

## 2021-05-05 ENCOUNTER — Ambulatory Visit
Admission: RE | Admit: 2021-05-05 | Discharge: 2021-05-05 | Disposition: A | Discharge status: Home / Self Care | Payer: Medicare HMO | Source: Ambulatory Visit | Attending: Adult Health | Admitting: Adult Health

## 2021-05-05 ENCOUNTER — Other Ambulatory Visit: Payer: Self-pay | Admitting: Adult Health

## 2021-05-05 DIAGNOSIS — R928 Other abnormal and inconclusive findings on diagnostic imaging of breast: Secondary | ICD-10-CM

## 2021-05-05 DIAGNOSIS — N6320 Unspecified lump in the left breast, unspecified quadrant: Secondary | ICD-10-CM

## 2021-05-05 DIAGNOSIS — N6342 Unspecified lump in left breast, subareolar: Secondary | ICD-10-CM | POA: Diagnosis not present

## 2021-05-06 ENCOUNTER — Telehealth (INDEPENDENT_AMBULATORY_CARE_PROVIDER_SITE_OTHER): Payer: Medicare HMO | Admitting: Adult Health

## 2021-05-06 ENCOUNTER — Encounter: Payer: Self-pay | Admitting: Adult Health

## 2021-05-06 ENCOUNTER — Other Ambulatory Visit: Payer: Self-pay | Admitting: Internal Medicine

## 2021-05-06 VITALS — Ht 66.0 in | Wt 212.0 lb

## 2021-05-06 DIAGNOSIS — R928 Other abnormal and inconclusive findings on diagnostic imaging of breast: Secondary | ICD-10-CM | POA: Diagnosis not present

## 2021-05-06 IMAGING — DX DG HIP (WITH OR WITHOUT PELVIS) 2-3V*L*
3 series · 3 of 3 positions shown · non-contrast
Comparison: None.

CLINICAL DATA: Left hip pain X 2 months worse within the last 2
weeks. No known injury.

EXAM:
DG HIP (WITH OR WITHOUT PELVIS) 2-3V LEFT

[pelvis ap]
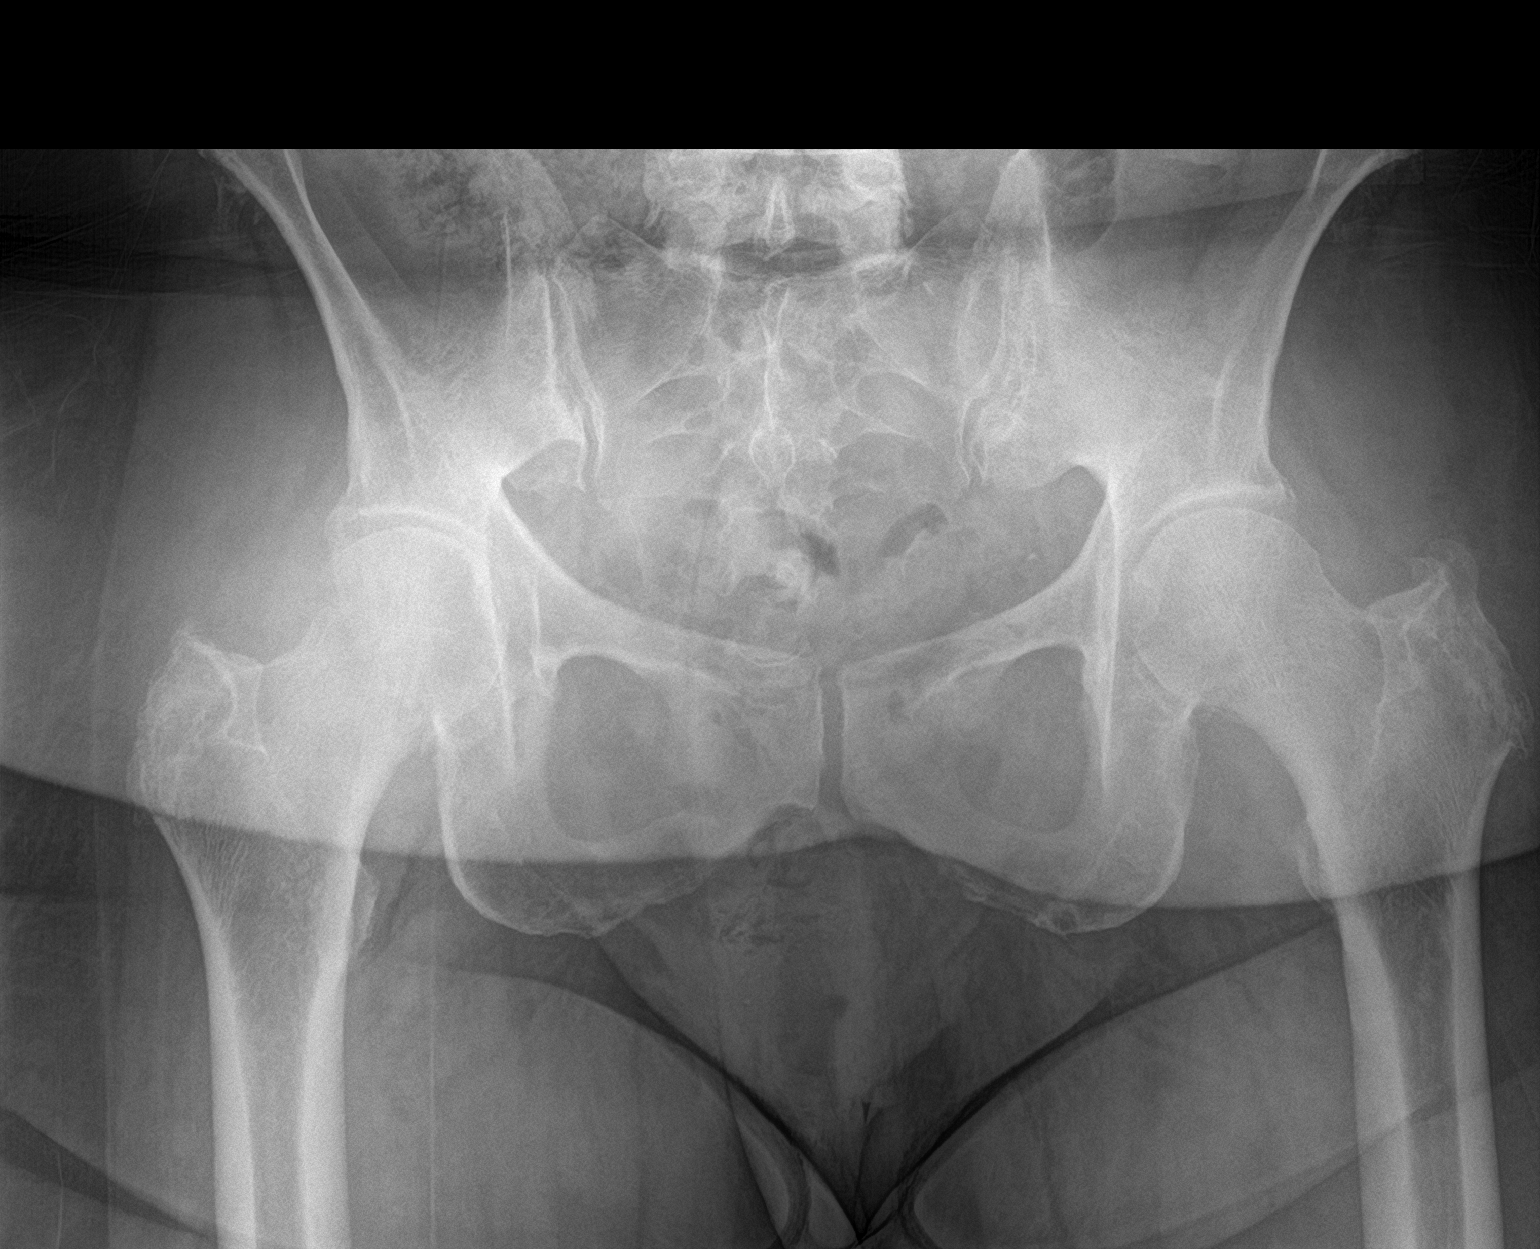

[hip ap]
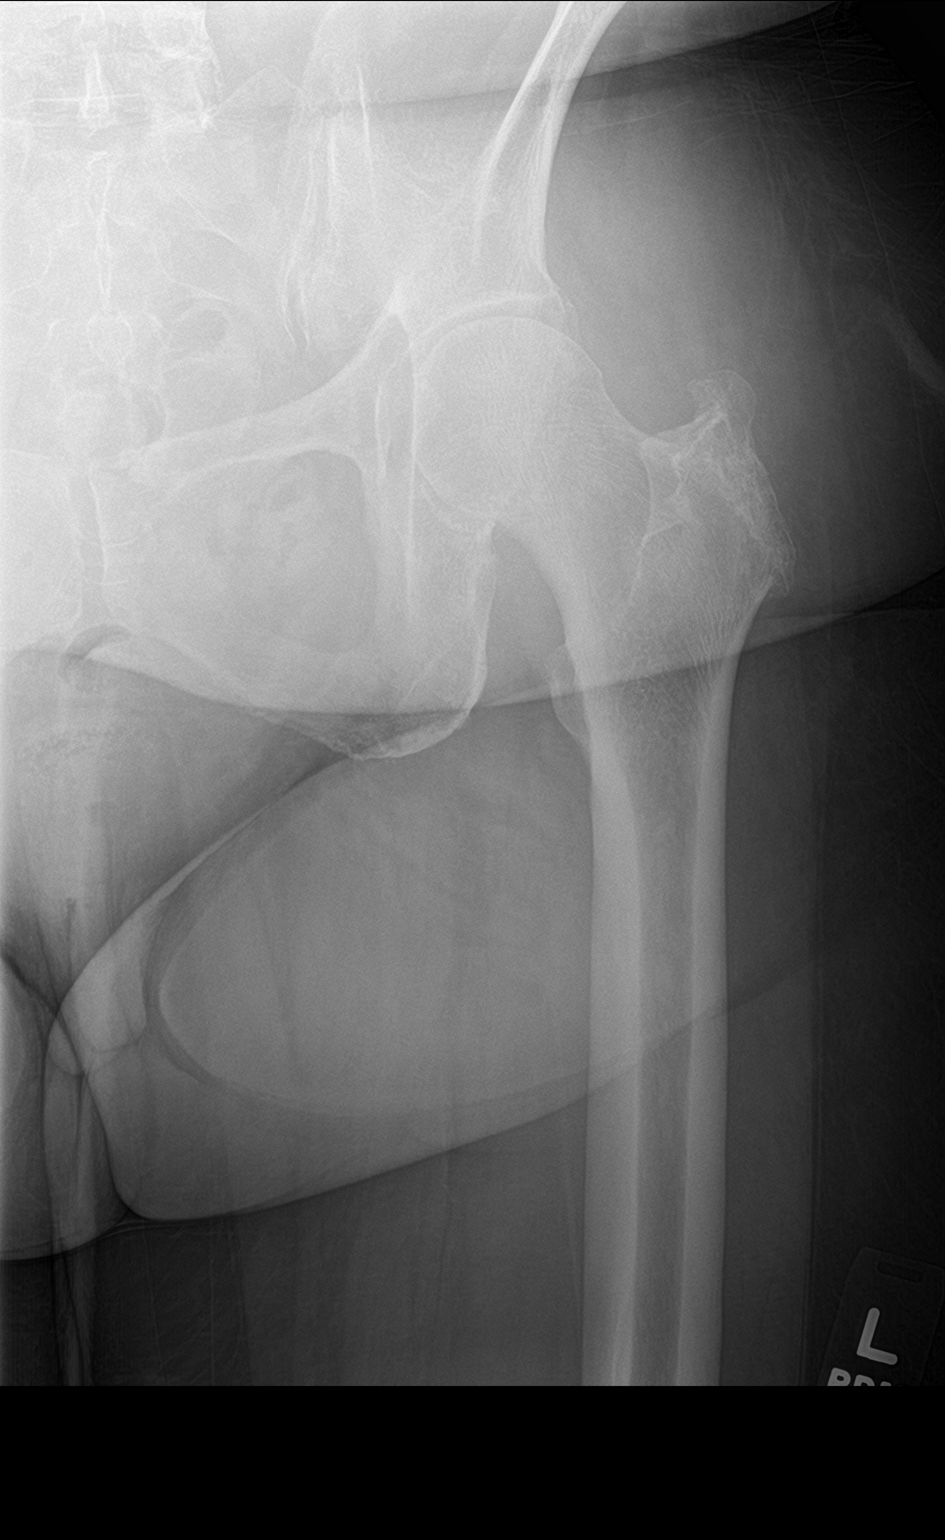

[hip lat]
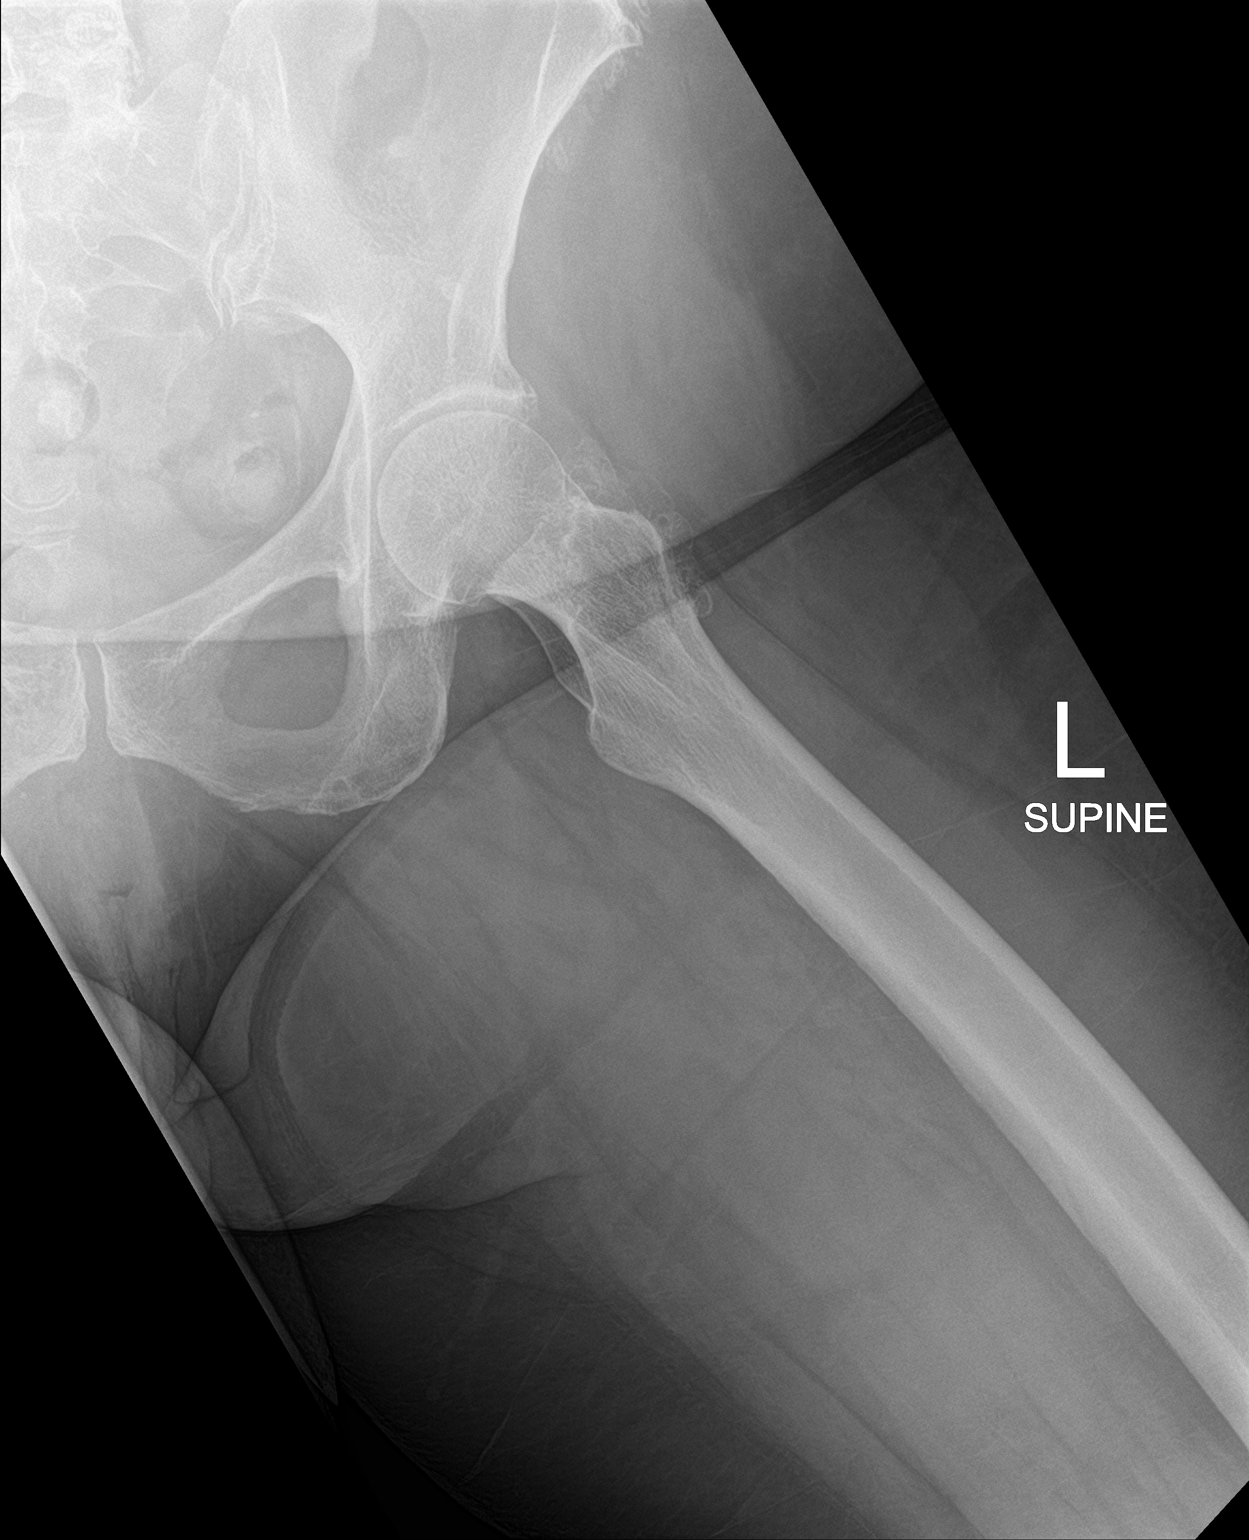

[3 of 3 positions shown; findings below may reference images not displayed]

FINDINGS: There is no evidence of hip fracture or dislocation. There is no
evidence of arthropathy or other focal bone abnormality.
IMPRESSION: Negative.

## 2021-05-06 MED ORDER — REPATHA SURECLICK 140 MG/ML ~~LOC~~ SOAJ: 1.0000 | SUBCUTANEOUS | 3 refills | Status: DC

## 2021-05-06 NOTE — Progress Notes (Signed)
Virtual Visit via Video Note ? ?I connected with [name] on 05/06/21 at  5:00 PM EDT by a video enabled telemedicine application and verified that I am speaking with the correct person using two identifiers. ? Location patient: home ?Location provider:work or home office ?Persons participating in the virtual visit: patient, provider ? ?I discussed the limitations of evaluation and management by telemedicine and the availability of in person appointments. The patient expressed understanding and agreed to proceed. ? ? ?HPI: ?71 year old female who is being dilated via video visit to discuss recent diagnostic mammogram and ultrasound reports.  Her screening mammogram she had concern for possible asymmetry of her left breast.  She reports that she has had some clear drainage from her nipple for quite some time and when they were doing the exam she had some yellow drainage from her nipple. ? ?IMPRESSION: ?Indeterminate mass within the immediate retroareolar left breast, ?potentially a small papilloma. ?  ?No concerning abnormality upper-outer left breast at the site of ?mammographic asymmetry. ? ?She is wondering if she actually needs to have a biopsy done. ? ? ?ROS: See pertinent positives and negatives per HPI. ? ?Past Medical History:  ?Diagnosis Date  ? Arthritis   ? Constipation   ? GERD (gastroesophageal reflux disease)   ? Hyperlipidemia   ? Hypertension   ? ? ?Past Surgical History:  ?Procedure Laterality Date  ? ABDOMINAL HYSTERECTOMY    ? COLONOSCOPY  2017  ? COLPORRHAPHY    ? PROLAPSED UTERINE FIBROID LIGATION    ? x2  ? SALPINGOOPHORECTOMY Right   ? ? ?Family History  ?Problem Relation Age of Onset  ? Breast cancer Mother   ?     1s  ? Hypertension Mother   ? Schizophrenia Mother   ? Hypertension Maternal Grandmother   ? Diabetes Maternal Grandmother   ? Breast cancer Maternal Grandmother   ?     ?  ? Colon cancer Neg Hx   ? Colon polyps Neg Hx   ? Esophageal cancer Neg Hx   ? Rectal cancer Neg Hx    ? Stomach cancer Neg Hx   ? ? ? ? ? ?Current Outpatient Medications:  ?  acetaminophen (TYLENOL) 325 MG tablet, Take 650 mg by mouth every 6 (six) hours as needed., Disp: , Rfl:  ?  amLODipine (NORVASC) 10 MG tablet, TAKE 1 TABLET EVERY DAY, Disp: 90 tablet, Rfl: 0 ?  aspirin EC 81 MG tablet, Take 81 mg by mouth daily. Swallow whole., Disp: , Rfl:  ?  diclofenac sodium (VOLTAREN) 1 % GEL, Apply 2 g topically 4 (four) times daily as needed., Disp: 100 g, Rfl: 3 ?  Evolocumab (REPATHA SURECLICK) 338 MG/ML SOAJ, Inject 1 Dose into the skin every 14 (fourteen) days., Disp: 6 mL, Rfl: 3 ?  lisinopril-hydrochlorothiazide (ZESTORETIC) 20-25 MG tablet, TAKE 1 TABLET EVERY DAY, Disp: 90 tablet, Rfl: 0 ?  omeprazole (PRILOSEC) 20 MG capsule, TAKE 1 CAPSULE EVERY DAY, Disp: 90 capsule, Rfl: 2 ?  traZODone (DESYREL) 100 MG tablet, Take 1 tablet (100 mg total) by mouth at bedtime., Disp: 90 tablet, Rfl: 1 ?  atorvastatin (LIPITOR) 40 MG tablet, Take 1 tablet (40 mg total) by mouth daily., Disp: 90 tablet, Rfl: 3 ? ?EXAM: ? ?VITALS per patient if applicable: ? ?GENERAL: alert, oriented, appears well and in no acute distress ? ?HEENT: atraumatic, conjunttiva clear, no obvious abnormalities on inspection of external nose and ears ? ?NECK: normal movements of the head and neck ? ?  LUNGS: on inspection no signs of respiratory distress, breathing rate appears normal, no obvious gross SOB, gasping or wheezing ? ?CV: no obvious cyanosis ? ?MS: moves all visible extremities without noticeable abnormality ? ?PSYCH/NEURO: pleasant and cooperative, no obvious depression or anxiety, speech and thought processing grossly intact ? ?ASSESSMENT AND PLAN: ? ?Discussed the following assessment and plan: ? ?1. Abnormal mammogram of left breast ?-Did encourage patient to go through with the biopsy and she has agreed.  We will discuss these findings after her biopsy results early next week. ? ? ? ?  ?I discussed the assessment and treatment plan with  the patient. The patient was provided an opportunity to ask questions and all were answered. The patient agreed with the plan and demonstrated an understanding of the instructions. ?  ?The patient was advised to call back or seek an in-person evaluation if the symptoms worsen or if the condition fails to improve as anticipated. ? ? ?Dorothyann Peng, NP  ? ?

## 2021-05-12 ENCOUNTER — Ambulatory Visit
Admission: RE | Admit: 2021-05-12 | Discharge: 2021-05-12 | Disposition: A | Discharge status: Home / Self Care | Payer: Medicare HMO | Source: Ambulatory Visit | Attending: Adult Health | Admitting: Adult Health

## 2021-05-12 DIAGNOSIS — N6320 Unspecified lump in the left breast, unspecified quadrant: Secondary | ICD-10-CM

## 2021-05-12 DIAGNOSIS — N6342 Unspecified lump in left breast, subareolar: Secondary | ICD-10-CM | POA: Diagnosis not present

## 2021-05-12 DIAGNOSIS — N62 Hypertrophy of breast: Secondary | ICD-10-CM | POA: Diagnosis not present

## 2021-05-15 ENCOUNTER — Telehealth: Payer: Self-pay | Admitting: Adult Health

## 2021-05-15 NOTE — Telephone Encounter (Signed)
Spoke to patient about having an MRI of bilateral breasts done for nipple discharge. She declines wanting to go through with this at this time, stating " I don't have a lot of nipple discharge and my biopsy came back negative" ? ?Advised patient that an MR may pick up on something that the mammogram missed. She continues to decline MRI at this time.  ? ? ?

## 2021-05-15 NOTE — Telephone Encounter (Signed)
Pt call back and stated she need Tommi Rumps to call her back about a mri ?

## 2021-05-20 ENCOUNTER — Encounter: Payer: Self-pay | Admitting: Adult Health

## 2021-05-20 ENCOUNTER — Ambulatory Visit (INDEPENDENT_AMBULATORY_CARE_PROVIDER_SITE_OTHER): Payer: Medicare HMO | Admitting: Adult Health

## 2021-05-20 VITALS — BP 125/67 | HR 68 | Temp 98.3°F | Ht 66.0 in | Wt 214.0 lb

## 2021-05-20 DIAGNOSIS — M109 Gout, unspecified: Secondary | ICD-10-CM | POA: Diagnosis not present

## 2021-05-20 MED ORDER — PREDNISONE 10 MG PO TABS: ORAL_TABLET | ORAL | 0 refills | Status: DC

## 2021-05-20 NOTE — Progress Notes (Signed)
? ?Subjective:  ? ? Patient ID: Heather Gibson, female    DOB: January 27, 1950, 71 y.o.   MRN: [id number] ? ?HPI ?71 year old female who  has a past medical history of Arthritis, Constipation, GERD (gastroesophageal reflux disease), Hyperlipidemia, and Hypertension. ? ?She presents to the office today for acute pain in her right great toe.  Pain started 2 days ago.  She has also noticed some redness, warmth, and swelling to her right great toe.  Day prior she had a ham sandwich and the day prior to that she had a steak.  She does not drink alcohol.  No shellfish over the weekend. ? ?Has not been using anything OTC ? ?Remote history of gout  ? ?Review of Systems ?See HPI  ? ?Past Medical History:  ?Diagnosis Date  ? Arthritis   ? Constipation   ? GERD (gastroesophageal reflux disease)   ? Hyperlipidemia   ? Hypertension   ? ? ?Social History  ? ?Socioeconomic History  ? Marital status: Married  ?  Spouse name: Not on file  ? Number of children: Not on file  ? Years of education: Not on file  ? Highest education level: Not on file  ?Occupational History  ? Not on file  ?Tobacco Use  ? Smoking status: Never  ? Smokeless tobacco: Never  ?Vaping Use  ? Vaping Use: Never used  ?Substance and Sexual Activity  ? Alcohol use: No  ? Drug use: No  ? Sexual activity: Not Currently  ?  Birth control/protection: Post-menopausal  ?Other Topics Concern  ? Not on file  ?Social History Narrative  ? 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 Married for 14 years  ? Three Children ( all live locally)   ? ?Social Determinants of Health  ? ?Financial Resource Strain: Low Risk   ? Difficulty of Paying Living Expenses: Not hard at all  ?Food Insecurity: Not on file  ?Transportation Needs: No Transportation Needs  ? Lack of Transportation (Medical): No  ? Lack of Transportation (Non-Medical): No  ?Physical Activity: Insufficiently Active  ? Days of Exercise per Week: 5 days  ? Minutes of Exercise per Session: 10 min   ?Stress: No Stress Concern Present  ? Feeling of Stress : Not at all  ?Social Connections: Moderately Isolated  ? Frequency of Communication with Friends and Family: Twice a week  ? Frequency of Social Gatherings with Friends and Family: Twice a week  ? Attends Religious Services: More than 4 times per year  ? Active Member of Clubs or Organizations: No  ? Attends Archivist Meetings: Never  ? Marital Status: Separated  ?Intimate Partner Violence: Not At Risk  ? Fear of Current or Ex-Partner: No  ? Emotionally Abused: No  ? Physically Abused: No  ? Sexually Abused: No  ? ? ?Past Surgical History:  ?Procedure Laterality Date  ? ABDOMINAL HYSTERECTOMY    ? COLONOSCOPY  2017  ? COLPORRHAPHY    ? PROLAPSED UTERINE FIBROID LIGATION    ? x2  ? SALPINGOOPHORECTOMY Right   ? ? ?Family History  ?Problem Relation Age of Onset  ? Breast cancer Mother   ?     68s  ? Hypertension Mother   ? Schizophrenia Mother   ? Hypertension Maternal Grandmother   ? Diabetes Maternal Grandmother   ? Breast cancer Maternal Grandmother   ?     ?  ? Colon cancer Neg Hx   ? Colon  polyps Neg Hx   ? Esophageal cancer Neg Hx   ? Rectal cancer Neg Hx   ? Stomach cancer Neg Hx   ? ? ?Allergies  ?Allergen Reactions  ? Crestor [Rosuvastatin] Other (See Comments)  ?  Myalgia   ? Eggs Or Egg-Derived Products   ?  Years ago dx- eats egg now without any problems  ? Latex Swelling  ? Milk-Related Compounds   ?  Cannot have large amt of milk products  ? Peanut-Containing Drug Products   ?  Cannot have large amts of  ? Tramadol Itching  ? Shellfish Allergy Itching  ? ? ?Current Outpatient Medications on File Prior to Visit  ?Medication Sig Dispense Refill  ? acetaminophen (TYLENOL) 325 MG tablet Take 650 mg by mouth every 6 (six) hours as needed.    ? amLODipine (NORVASC) 10 MG tablet TAKE 1 TABLET EVERY DAY 90 tablet 0  ? aspirin EC 81 MG tablet Take 81 mg by mouth daily. Swallow whole.    ? diclofenac sodium (VOLTAREN) 1 % GEL Apply 2 g topically  4 (four) times daily as needed. 100 g 3  ? Evolocumab (REPATHA SURECLICK) 659 MG/ML SOAJ Inject 1 Dose into the skin every 14 (fourteen) days. 6 mL 3  ? lisinopril-hydrochlorothiazide (ZESTORETIC) 20-25 MG tablet TAKE 1 TABLET EVERY DAY 90 tablet 0  ? omeprazole (PRILOSEC) 20 MG capsule TAKE 1 CAPSULE EVERY DAY 90 capsule 2  ? traZODone (DESYREL) 100 MG tablet Take 1 tablet (100 mg total) by mouth at bedtime. 90 tablet 1  ? atorvastatin (LIPITOR) 40 MG tablet Take 1 tablet (40 mg total) by mouth daily. 90 tablet 3  ? ?No current facility-administered medications on file prior to visit.  ? ? ?BP 125/67 Comment: home  Pulse 68   Temp 98.3 ?F (36.8 ?C) (Oral)   Ht '5\' 6"'$  (1.676 m)   Wt 214 lb (97.1 kg)   SpO2 97%   BMI 34.54 kg/m?  ? ? ?   ?Objective:  ? Physical Exam ?Vitals and nursing note reviewed.  ?Constitutional:   ?   Appearance: Normal appearance.  ?Cardiovascular:  ?   Rate and Rhythm: Normal rate and regular rhythm.  ?   Pulses: Normal pulses.  ?   Heart sounds: Normal heart sounds.  ?Pulmonary:  ?   Effort: Pulmonary effort is normal.  ?   Breath sounds: Normal breath sounds.  ?Skin: ?   General: Skin is warm and dry.  ?   Findings: Erythema present.  ?   Comments: Localized erythema, redness, warmth, swelling and pain noted to DIP joint of right great toe  ?Neurological:  ?   General: No focal deficit present.  ?   Mental Status: She is alert and oriented to person, place, and time.  ?Psychiatric:     ?   Mood and Affect: Mood normal.     ?   Behavior: Behavior normal.     ?   Thought Content: Thought content normal.     ?   Judgment: Judgment normal.  ? ?   ?Assessment & Plan:  ?1. Acute gout involving toe of left foot, unspecified cause ? ?- predniSONE (DELTASONE) 10 MG tablet; 40 mg x 3 days, 20 mg x 3 days, 10 mg x 3 days  Dispense: 21 tablet; Refill: 0 ?- Follow up as needed ? ?Dorothyann Peng, NP ? ? ?

## 2021-05-27 ENCOUNTER — Other Ambulatory Visit: Payer: Self-pay | Admitting: Adult Health

## 2021-05-27 ENCOUNTER — Telehealth: Payer: Self-pay | Admitting: Adult Health

## 2021-05-27 MED ORDER — PREDNISONE 20 MG PO TABS: 20.0000 mg | ORAL_TABLET | Freq: Every day | ORAL | 0 refills | Status: DC

## 2021-05-27 NOTE — Telephone Encounter (Signed)
Spoke to patient and she reports that although she has had some significant improvement she still has some swelling and pain from gout in her right great toe.   She tried tylenol which did not help and she has taken Aleve which helps but can irritate her stomach.   Will do another course of steroids and have her take Aleve for the next 2-3 days   Follow up as eeded

## 2021-05-27 NOTE — Telephone Encounter (Signed)
Please advise 

## 2021-05-27 NOTE — Telephone Encounter (Signed)
Pt was seen on 05-20-2021 . Pt has finished medication and foot still has swelling around the toes. Pt did not want to make another appt due to no transportation. Please advise  [place], [place] - [address] [place] Phone:  [phone]  Fax:  [phone]

## 2021-07-24 ENCOUNTER — Other Ambulatory Visit: Payer: Self-pay | Admitting: Adult Health

## 2021-07-24 DIAGNOSIS — I10 Essential (primary) hypertension: Secondary | ICD-10-CM

## 2021-09-10 ENCOUNTER — Other Ambulatory Visit: Payer: Self-pay | Admitting: Internal Medicine

## 2021-10-14 ENCOUNTER — Telehealth: Payer: Self-pay | Admitting: Adult Health

## 2021-10-14 MED ORDER — TRAZODONE HCL 100 MG PO TABS: 100.0000 mg | ORAL_TABLET | Freq: Every day | ORAL | 0 refills | Status: DC

## 2021-10-14 NOTE — Telephone Encounter (Signed)
Refill sent.

## 2021-10-14 NOTE — Telephone Encounter (Signed)
Pt is calling and would a refill on traZODone (DESYREL) 100 MG tablet  [id number] - [address], [address] AT [address] [place] Phone:  [phone]  Fax:  [phone]

## 2021-11-03 ENCOUNTER — Ambulatory Visit (INDEPENDENT_AMBULATORY_CARE_PROVIDER_SITE_OTHER): Payer: Medicare HMO

## 2021-11-03 VITALS — Ht 66.0 in | Wt 218.0 lb

## 2021-11-03 DIAGNOSIS — Z0000 Encounter for general adult medical examination without abnormal findings: Secondary | ICD-10-CM

## 2021-11-03 NOTE — Progress Notes (Signed)
I connected with Heather Gibson today by telephone and verified that I am speaking with the correct person using two identifiers. Location patient: home Location provider: work Persons participating in the virtual visit: Heather Gibson, Heather Durand LPN.   I discussed the limitations, risks, security and privacy concerns of performing an evaluation and management service by telephone and the availability of in person appointments. I also discussed with the patient that there may be a patient responsible charge related to this service. The patient expressed understanding and verbally consented to this telephonic visit.    Interactive audio and video telecommunications were attempted between this provider and patient, however failed, due to patient having technical difficulties OR patient did not have access to video capability.  We continued and completed visit with audio only.     Vital signs may be patient reported or missing.  Subjective:   Heather Gibson is a 71 y.o. female who presents for Medicare Annual (Subsequent) preventive examination.  Review of Systems     Cardiac Risk Factors include: advanced age (>8mn, >>76women);dyslipidemia;hypertension;obesity (BMI >30kg/m2)     Objective:    Today's Vitals   11/03/21 0911 11/03/21 0912  Weight: 218 lb (98.9 kg)   Height: '5\' 6"'$  (1.676 m)   PainSc:  5    Body mass index is 35.19 kg/m.     11/03/2021    9:17 AM 11/01/2020    9:51 AM 10/27/2019    9:07 AM 07/11/2019    9:36 AM 04/05/2015   11:08 AM  Advanced Directives  Does Patient Have a Medical Advance Directive? No No No No No  Would patient like information on creating a medical advance directive?  No - Patient declined No - Patient declined No - Patient declined     Current Medications (verified) Outpatient Encounter Medications as of 11/03/2021  Medication Sig   acetaminophen (TYLENOL) 325 MG tablet Take 650 mg by mouth every 6 (six) hours as needed.    amLODipine (NORVASC) 10 MG tablet TAKE 1 TABLET EVERY DAY   aspirin EC 81 MG tablet Take 81 mg by mouth daily. Swallow whole.   atorvastatin (LIPITOR) 40 MG tablet TAKE 1 TABLET EVERY DAY   Evolocumab (REPATHA SURECLICK) 1269MG/ML SOAJ Inject 1 Dose into the skin every 14 (fourteen) days.   lisinopril-hydrochlorothiazide (ZESTORETIC) 20-25 MG tablet TAKE 1 TABLET EVERY DAY   omeprazole (PRILOSEC) 20 MG capsule TAKE 1 CAPSULE EVERY DAY   traZODone (DESYREL) 100 MG tablet Take 1 tablet (100 mg total) by mouth at bedtime.   diclofenac sodium (VOLTAREN) 1 % GEL Apply 2 g topically 4 (four) times daily as needed. (Patient not taking: Reported on 11/03/2021)   predniSONE (DELTASONE) 20 MG tablet Take 1 tablet (20 mg total) by mouth daily with breakfast. (Patient not taking: Reported on 11/03/2021)   No facility-administered encounter medications on file as of 11/03/2021.    Allergies (verified) Crestor [rosuvastatin], Eggs or egg-derived products, Latex, Milk-related compounds, Peanut-containing drug products, Tramadol, and Shellfish allergy   History: Past Medical History:  Diagnosis Date   Arthritis    Constipation    GERD (gastroesophageal reflux disease)    Hyperlipidemia    Hypertension    Past Surgical History:  Procedure Laterality Date   ABDOMINAL HYSTERECTOMY     COLONOSCOPY  2017   COLPORRHAPHY     PROLAPSED UTERINE FIBROID LIGATION     x2   SALPINGOOPHORECTOMY Right    Family History  Problem Relation Age of Onset  Breast cancer Mother        58s   Hypertension Mother    Schizophrenia Mother    Hypertension Maternal Grandmother    Diabetes Maternal Grandmother    Breast cancer Maternal Grandmother        ?   Colon cancer Neg Hx    Colon polyps Neg Hx    Esophageal cancer Neg Hx    Rectal cancer Neg Hx    Stomach cancer Neg Hx    Social History   Socioeconomic History   Marital status: Married    Spouse name: Not on file   Number of children: Not on file    Years of education: Not on file   Highest education level: Not on file  Occupational History   Not on file  Tobacco Use   Smoking status: Never   Smokeless tobacco: Never  Vaping Use   Vaping Use: Never used  Substance and Sexual Activity   Alcohol use: No   Drug use: No   Sexual activity: Not Currently    Birth control/protection: Post-menopausal  Other Topics Concern   Not on file  Social History Narrative   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   Married for 14 years   Three Children ( all live locally)    Social Determinants of Health   Financial Resource Strain: [place]  (11/03/2021)   Overall Financial Resource Strain (CARDIA)    Difficulty of Paying Living Expenses: Not hard at all  Food Insecurity: No Food Insecurity (11/03/2021)   Hunger Vital Sign    Worried About Running Out of Food in the Last Year: Never true    Ran Out of Food in the Last Year: Never true  Transportation Needs: No Transportation Needs (11/03/2021)   PRAPARE - Hydrologist (Medical): No    Lack of Transportation (Non-Medical): No  Physical Activity: Inactive (11/03/2021)   Exercise Vital Sign    Days of Exercise per Week: 0 days    Minutes of Exercise per Session: 0 min  Stress: No Stress Concern Present (11/03/2021)   [place]    Feeling of Stress : Not at all  Social Connections: Moderately Isolated (11/01/2020)   Social Connection and Isolation Panel [NHANES]    Frequency of Communication with Friends and Family: Twice a week    Frequency of Social Gatherings with Friends and Family: Twice a week    Attends Religious Services: More than 4 times per year    Active Member of [employer] or Organizations: No    Attends Music therapist: Never    Marital Status: Separated    Tobacco Counseling Counseling given: Not Answered   Clinical Intake:  Pre-visit  preparation completed: Yes  Pain : 0-10 Pain Score: 5  Pain Type: Chronic pain Pain Location: Hand Pain Orientation: Left, Right Pain Descriptors / Indicators: Aching Pain Onset: More than a month ago Pain Frequency: Intermittent     Nutritional Status: BMI > 30  Obese Nutritional Risks: None Diabetes: No  How often do you need to have someone help you when you read instructions, pamphlets, or other written materials from your doctor or pharmacy?: 1 - Never What is the last grade level you completed in school?: college  Diabetic? no  Interpreter Needed?: No  Information entered by :: NAllen LPN   Activities of Daily Living    11/03/2021    9:18  AM  In your present state of health, do you have any difficulty performing the following activities:  Hearing? 0  Vision? 0  Difficulty concentrating or making decisions? 0  Walking or climbing stairs? 0  Dressing or bathing? 0  Doing errands, shopping? 0  Preparing Food and eating ? N  Using the Toilet? N  In the past six months, have you accidently leaked urine? Y  Do you have problems with loss of bowel control? Y  Managing your Medications? N  Managing your Finances? N  Housekeeping or managing your Housekeeping? N    Patient Care Team: Dorothyann Peng, NP as PCP - General (Family Medicine)  Indicate any recent Medical Services you may have received from other than [facility] providers in the past year (date may be approximate).     Assessment:   This is a routine wellness examination for [place].  Hearing/Vision screen Vision Screening - Comments:: No regular eye exams, [facility]  Dietary issues and exercise activities discussed: Current Exercise Habits: The patient does not participate in regular exercise at present   Goals Addressed             This Visit's Progress    Patient Stated       11/03/2021, wants to start exercising 3 times weekly       Depression Screen    11/03/2021    9:18 AM  11/01/2020    9:54 AM 11/01/2020    9:49 AM 10/27/2019    9:10 AM 09/13/2019    8:03 AM 06/16/2017    9:33 AM  PHQ 2/9 Scores  PHQ - 2 Score 0 0 0 0 0 0  PHQ- 9 Score    0      Fall Risk    11/03/2021    9:18 AM 11/01/2020    9:53 AM 10/27/2019    9:09 AM 09/13/2019    8:02 AM 06/16/2017    9:41 AM  Fall Risk   Falls in the past year? 0 0 0 0 Yes  Number falls in past yr: 0 0 0 0 1  Injury with Fall? 0 0 0 0 Yes  Comment     right shoulder injury   Risk for fall due to : Medication side effect  No Fall Risks    Follow up Falls prevention discussed;Education provided;Falls evaluation completed Falls evaluation completed Falls evaluation completed;Falls prevention discussed Falls evaluation completed Falls evaluation completed;Falls prevention discussed;Education provided;Follow up appointment    FALL RISK PREVENTION PERTAINING TO THE HOME:  Any stairs in or around the home? No  If so, are there any without handrails?  ramp Home free of loose throw rugs in walkways, pet beds, electrical cords, etc? No  Adequate lighting in your home to reduce risk of falls? No   ASSISTIVE DEVICES UTILIZED TO PREVENT FALLS:  Life alert? No  Use of a cane, walker or w/c? No  Grab bars in the bathroom? No  Shower chair or bench in shower? No  Elevated toilet seat or a handicapped toilet? Yes   TIMED UP AND GO:  Was the test performed? No .       Cognitive Function:        11/03/2021    9:20 AM  6CIT Screen  What Year? 0 points  What month? 0 points  What time? 0 points  Count back from 20 0 points  Months in reverse 0 points  Repeat phrase 0 points  Total Score 0 points  Immunizations Immunization History  Administered Date(s) Administered   Fluad Quad(high Dose 65+) 12/28/2018, 10/25/2019   Influenza, High Dose Seasonal PF 10/13/2017   Influenza,inj,Quad PF,6+ Mos 01/20/2016, 10/22/2016   PFIZER Comirnaty(Gray Top)Covid-19 Tri-Sucrose Vaccine 01/30/2020   PFIZER(Purple  Top)SARS-COV-2 Vaccination 02/25/2019, 03/21/2019   Pneumococcal Conjugate-13 06/16/2017   Pneumococcal Polysaccharide-23 08/11/2005, 10/25/2019   Td 05/09/2003   Tdap 04/15/2016   Zoster Recombinat (Shingrix) 07/02/2021    TDAP status: Up to date  Flu Vaccine status: Due, Education has been provided regarding the importance of this vaccine. Advised may receive this vaccine at local pharmacy or Health Dept. Aware to provide a copy of the vaccination record if obtained from local pharmacy or Health Dept. Verbalized acceptance and understanding.  Pneumococcal vaccine status: Up to date  Covid-19 vaccine status: Completed vaccines  Qualifies for Shingles Vaccine? Yes   Zostavax completed No   Shingrix Completed?: needs second dose  Screening Tests Health Maintenance  Topic Date Due   COVID-19 Vaccine (4 - Pfizer risk series) 03/26/2020   INFLUENZA VACCINE  08/05/2021   Zoster Vaccines- Shingrix (2 of 2) 08/27/2021   Medicare Annual Wellness (AWV)  11/01/2021   MAMMOGRAM  04/22/2022   COLONOSCOPY (Pts 45-59yr Insurance coverage will need to be confirmed)  05/01/2022   TETANUS/TDAP  04/16/2026   Pneumonia Vaccine 70 Years old  Completed   DEXA SCAN  Completed   Hepatitis C Screening  Completed   HPV VACCINES  Aged Out    Health Maintenance  Health Maintenance Due  Topic Date Due   COVID-19 Vaccine (4 - Pfizer risk series) 03/26/2020   INFLUENZA VACCINE  08/05/2021   Zoster Vaccines- Shingrix (2 of 2) 08/27/2021   Medicare Annual Wellness (AWV)  11/01/2021    Colorectal cancer screening: Type of screening: Colonoscopy. Completed 05/01/2019. Repeat every 3 years  Mammogram status: Completed 04/21/2021. Repeat every year  Bone Density status: Completed 05/20/2015.   Lung Cancer Screening: (Low Dose CT Chest recommended if Age 70-80years, 30 pack-year currently smoking OR have quit w/in 15years.) does not qualify.   Lung Cancer Screening Referral: no  Additional  Screening:  Hepatitis C Screening: does qualify; Completed 06/16/2017  Vision Screening: Recommended annual ophthalmology exams for early detection of glaucoma and other disorders of the eye. Is the patient up to date with their annual eye exam?  Yes  Who is the provider or what is the name of the office in which the patient attends annual eye exams? [facility] If pt is not established with a provider, would they like to be referred to a provider to establish care? No .   Dental Screening: Recommended annual dental exams for proper oral hygiene  Community Resource Referral / Chronic Care Management: CRR required this visit?  No   CCM required this visit?  No      Plan:     I have personally reviewed and noted the following in the patient's chart:   Medical and social history Use of alcohol, tobacco or illicit drugs  Current medications and supplements including opioid prescriptions. Patient is not currently taking opioid prescriptions. Functional ability and status Nutritional status Physical activity Advanced directives List of other physicians Hospitalizations, surgeries, and ER visits in previous 12 months Vitals Screenings to include cognitive, depression, and falls Referrals and appointments  In addition, I have reviewed and discussed with patient certain preventive protocols, quality metrics, and best practice recommendations. A written personalized care plan for preventive services as well as general preventive  health recommendations were provided to patient.     Kellie Simmering, LPN   [id number]/20/[id number]   Nurse Notes: none  Due to this being a virtual visit, the after visit summary with patients personalized plan was offered to patient via mail or my-chart.  to pick up at office at next visit

## 2021-11-03 NOTE — Patient Instructions (Signed)
Heather Gibson , Thank you for taking time to come for your Medicare Wellness Visit. I appreciate your ongoing commitment to your health goals. Please review the following plan we discussed and let me know if I can assist you in the future.   Screening recommendations/referrals: Colonoscopy: completed 05/01/2019, due 05/01/2022 Mammogram: completed 04/21/2021, due 04/23/2022 Bone Density: completed 05/20/2015 Recommended yearly ophthalmology/optometry visit for glaucoma screening and checkup Recommended yearly dental visit for hygiene and checkup  Vaccinations: Influenza vaccine: due Pneumococcal vaccine: completed 10/25/2019 Tdap vaccine: completed 04/15/2016, due 04/16/2026 Shingles vaccine: needs second dose   Covid-19: 01/30/2020, 03/21/2019, 02/25/2019  Advanced directives: Advance directive discussed with you today.   Conditions/risks identified: none  Next appointment: Follow up in one year for your annual wellness visit    Preventive Care 65 Years and Older, Female Preventive care refers to lifestyle choices and visits with your health care provider that can promote health and wellness. What does preventive care include? A yearly physical exam. This is also called an annual well check. Dental exams once or twice a year. Routine eye exams. Ask your health care provider how often you should have your eyes checked. Personal lifestyle choices, including: Daily care of your teeth and gums. Regular physical activity. Eating a healthy diet. Avoiding tobacco and drug use. Limiting alcohol use. Practicing safe sex. Taking low-dose aspirin every day. Taking vitamin and mineral supplements as recommended by your health care provider. What happens during an annual well check? The services and screenings done by your health care provider during your annual well check will depend on your age, overall health, lifestyle risk factors, and family history of disease. Counseling  Your health care  provider may ask you questions about your: Alcohol use. Tobacco use. Drug use. Emotional well-being. Home and relationship well-being. Sexual activity. Eating habits. History of falls. Memory and ability to understand (cognition). Work and work Statistician. Reproductive health. Screening  You may have the following tests or measurements: Height, weight, and BMI. Blood pressure. Lipid and cholesterol levels. These may be checked every 5 years, or more frequently if you are over 63 years old. Skin check. Lung cancer screening. You may have this screening every year starting at age 30 if you have a 30-pack-year history of smoking and currently smoke or have quit within the past 15 years. Fecal occult blood test (FOBT) of the stool. You may have this test every year starting at age 20. Flexible sigmoidoscopy or colonoscopy. You may have a sigmoidoscopy every 5 years or a colonoscopy every 10 years starting at age 44. Hepatitis C blood test. Hepatitis B blood test. Sexually transmitted disease (STD) testing. Diabetes screening. This is done by checking your blood sugar (glucose) after you have not eaten for a while (fasting). You may have this done every 1-3 years. Bone density scan. This is done to screen for osteoporosis. You may have this done starting at age 68. Mammogram. This may be done every 1-2 years. Talk to your health care provider about how often you should have regular mammograms. Talk with your health care provider about your test results, treatment options, and if necessary, the need for more tests. Vaccines  Your health care provider may recommend certain vaccines, such as: Influenza vaccine. This is recommended every year. Tetanus, diphtheria, and acellular pertussis (Tdap, Td) vaccine. You may need a Td booster every 10 years. Zoster vaccine. You may need this after age 37. Pneumococcal 13-valent conjugate (PCV13) vaccine. One dose is recommended after age  84.  Pneumococcal polysaccharide (PPSV23) vaccine. One dose is recommended after age 64. Talk to your health care provider about which screenings and vaccines you need and how often you need them. This information is not intended to replace advice given to you by your health care provider. Make sure you discuss any questions you have with your health care provider. Document Released: 01/18/2015 Document Revised: 09/11/2015 Document Reviewed: 10/23/2014 Elsevier Interactive Patient Education  2017 [place] Prevention in the Home Falls can cause injuries. They can happen to people of all ages. There are many things you can do to make your home safe and to help prevent falls. What can I do on the outside of my home? Regularly fix the edges of walkways and driveways and fix any cracks. Remove anything that might make you trip as you walk through a door, such as a raised step or threshold. Trim any bushes or trees on the path to your home. Use bright outdoor lighting. Clear any walking paths of anything that might make someone trip, such as rocks or tools. Regularly check to see if handrails are loose or broken. Make sure that both sides of any steps have handrails. Any raised decks and porches should have guardrails on the edges. Have any leaves, snow, or ice cleared regularly. Use sand or salt on walking paths during winter. Clean up any spills in your garage right away. This includes oil or grease spills. What can I do in the bathroom? Use night lights. Install grab bars by the toilet and in the tub and shower. Do not use towel bars as grab bars. Use non-skid mats or decals in the tub or shower. If you need to sit down in the shower, use a plastic, non-slip stool. Keep the floor dry. Clean up any water that spills on the floor as soon as it happens. Remove soap buildup in the tub or shower regularly. Attach bath mats securely with double-sided non-slip rug tape. Do not have throw  rugs and other things on the floor that can make you trip. What can I do in the bedroom? Use night lights. Make sure that you have a light by your bed that is easy to reach. Do not use any sheets or blankets that are too big for your bed. They should not hang down onto the floor. Have a firm chair that has side arms. You can use this for support while you get dressed. Do not have throw rugs and other things on the floor that can make you trip. What can I do in the kitchen? Clean up any spills right away. Avoid walking on wet floors. Keep items that you use a lot in easy-to-reach places. If you need to reach something above you, use a strong step stool that has a grab bar. Keep electrical cords out of the way. Do not use floor polish or wax that makes floors slippery. If you must use wax, use non-skid floor wax. Do not have throw rugs and other things on the floor that can make you trip. What can I do with my stairs? Do not leave any items on the stairs. Make sure that there are handrails on both sides of the stairs and use them. Fix handrails that are broken or loose. Make sure that handrails are as long as the stairways. Check any carpeting to make sure that it is firmly attached to the stairs. Fix any carpet that is loose or worn. Avoid having throw rugs at the top  or bottom of the stairs. If you do have throw rugs, attach them to the floor with carpet tape. Make sure that you have a light switch at the top of the stairs and the bottom of the stairs. If you do not have them, ask someone to add them for you. What else can I do to help prevent falls? Wear shoes that: Do not have high heels. Have rubber bottoms. Are comfortable and fit you well. Are closed at the toe. Do not wear sandals. If you use a stepladder: Make sure that it is fully opened. Do not climb a closed stepladder. Make sure that both sides of the stepladder are locked into place. Ask someone to hold it for you, if  possible. Clearly mark and make sure that you can see: Any grab bars or handrails. First and last steps. Where the edge of each step is. Use tools that help you move around (mobility aids) if they are needed. These include: Canes. Walkers. Scooters. Crutches. Turn on the lights when you go into a dark area. Replace any light bulbs as soon as they burn out. Set up your furniture so you have a clear path. Avoid moving your furniture around. If any of your floors are uneven, fix them. If there are any pets around you, be aware of where they are. Review your medicines with your doctor. Some medicines can make you feel dizzy. This can increase your chance of falling. Ask your doctor what other things that you can do to help prevent falls. This information is not intended to replace advice given to you by your health care provider. Make sure you discuss any questions you have with your health care provider. Document Released: 10/18/2008 Document Revised: 05/30/2015 Document Reviewed: 01/26/2014 Elsevier Interactive Patient Education  2017 [employer].

## 2021-11-05 DIAGNOSIS — H25813 Combined forms of age-related cataract, bilateral: Secondary | ICD-10-CM | POA: Diagnosis not present

## 2021-11-05 DIAGNOSIS — H40013 Open angle with borderline findings, low risk, bilateral: Secondary | ICD-10-CM | POA: Diagnosis not present

## 2021-11-05 DIAGNOSIS — H04123 Dry eye syndrome of bilateral lacrimal glands: Secondary | ICD-10-CM | POA: Diagnosis not present

## 2021-11-05 DIAGNOSIS — H1045 Other chronic allergic conjunctivitis: Secondary | ICD-10-CM | POA: Diagnosis not present

## 2021-12-23 ENCOUNTER — Telehealth: Payer: Self-pay | Admitting: Internal Medicine

## 2021-12-23 NOTE — Telephone Encounter (Signed)
Received PA request in [facility] for [place] Attempted to submit Message received: Authorization already on file for this request. Authorization starting on 01/05/2021 and ending on 01/05/2023.

## 2022-01-02 ENCOUNTER — Other Ambulatory Visit: Payer: Self-pay | Admitting: *Deleted

## 2022-01-02 DIAGNOSIS — E7801 Familial hypercholesterolemia: Secondary | ICD-10-CM

## 2022-01-06 ENCOUNTER — Encounter: Payer: Self-pay | Admitting: Adult Health

## 2022-01-06 ENCOUNTER — Telehealth (INDEPENDENT_AMBULATORY_CARE_PROVIDER_SITE_OTHER): Payer: Medicare HMO | Admitting: Adult Health

## 2022-01-06 ENCOUNTER — Telehealth: Payer: Self-pay

## 2022-01-06 VITALS — HR 75 | Ht 66.0 in | Wt 215.0 lb

## 2022-01-06 DIAGNOSIS — J069 Acute upper respiratory infection, unspecified: Secondary | ICD-10-CM

## 2022-01-06 MED ORDER — AMOXICILLIN-POT CLAVULANATE 875-125 MG PO TABS: 1.0000 | ORAL_TABLET | Freq: Two times a day (BID) | ORAL | 0 refills | Status: DC

## 2022-01-06 MED ORDER — BENZONATATE 200 MG PO CAPS: 200.0000 mg | ORAL_CAPSULE | Freq: Two times a day (BID) | ORAL | 1 refills | Status: DC | PRN

## 2022-01-06 NOTE — Telephone Encounter (Signed)
--  Caller states she has sinus congestion with a cough. no fever. Symptoms started on Monday. has been taking nyquil and nasal spray.   01/02/2022 10:29:03 AM See PCP within 24 Hours  Stringer, RN, Vanita Ingles  Comments User: Dannielle Burn, RN Date/Time Eilene Ghazi Time): 01/02/2022 10:28:58 AM pt does not want to go to urgent care but wants to know if medication can be called in> please call pt back  Referrals REFERRED TO PCP OFFICE GO TO FACILITY REFUSED  01/06/22 1049: Pt appears to have productive cough, body aches (normally has body aches with arthiritis). Denies fever, SOB, h/a. Has been taking Nyquil with honey & Tyelenol that has been helpful. Pt is requesting prescription meds that can help with symptoms. Virtual appt scheduled with PCP on 01/06/22.

## 2022-01-06 NOTE — Progress Notes (Signed)
Virtual Visit via Video Note  I connected with Heather Gibson on 01/06/22 at  5:15 PM EST by a video enabled telemedicine application and verified that I am speaking with the correct person using two identifiers.  Location patient: home Location provider:work or home office Persons participating in the virtual visit: patient, provider  I discussed the limitations of evaluation and management by telemedicine and the availability of in person appointments. The patient expressed understanding and agreed to proceed.   HPI:  She is being evaluated today for an acute issue. Her symptoms started about7-10 days ago.  Symptoms include that of sinus drainage, sinus pain and pressure, and a productive cough.   She denies fevers or chills.   She has been using Tylenol Sinus and OTC cough medication - this helps her sleep because it reduces her symptoms.    ROS: See pertinent positives and negatives per HPI.  Past Medical History:  Diagnosis Date   Arthritis    Constipation    GERD (gastroesophageal reflux disease)    Hyperlipidemia    Hypertension     Past Surgical History:  Procedure Laterality Date   ABDOMINAL HYSTERECTOMY     COLONOSCOPY  2017   COLPORRHAPHY     PROLAPSED UTERINE FIBROID LIGATION     x2   SALPINGOOPHORECTOMY Right     Family History  Problem Relation Age of Onset   Breast cancer Mother        14s   Hypertension Mother    Schizophrenia Mother    Hypertension Maternal Grandmother    Diabetes Maternal Grandmother    Breast cancer Maternal Grandmother        ?   Colon cancer Neg Hx    Colon polyps Neg Hx    Esophageal cancer Neg Hx    Rectal cancer Neg Hx    Stomach cancer Neg Hx        Current Outpatient Medications:    acetaminophen (TYLENOL) 325 MG tablet, Take 650 mg by mouth every 6 (six) hours as needed., Disp: , Rfl:    amLODipine (NORVASC) 10 MG tablet, TAKE 1 TABLET EVERY DAY, Disp: 90 tablet, Rfl: 0   aspirin EC 81 MG tablet, Take 81 mg by  mouth daily. Swallow whole., Disp: , Rfl:    atorvastatin (LIPITOR) 40 MG tablet, TAKE 1 TABLET EVERY DAY, Disp: 90 tablet, Rfl: 2   diclofenac sodium (VOLTAREN) 1 % GEL, Apply 2 g topically 4 (four) times daily as needed., Disp: 100 g, Rfl: 3   Evolocumab (REPATHA SURECLICK) 409 MG/ML SOAJ, Inject 1 Dose into the skin every 14 (fourteen) days., Disp: 6 mL, Rfl: 3   lisinopril-hydrochlorothiazide (ZESTORETIC) 20-25 MG tablet, TAKE 1 TABLET EVERY DAY, Disp: 90 tablet, Rfl: 0   omeprazole (PRILOSEC) 20 MG capsule, TAKE 1 CAPSULE EVERY DAY, Disp: 90 capsule, Rfl: 2   predniSONE (DELTASONE) 20 MG tablet, Take 1 tablet (20 mg total) by mouth daily with breakfast., Disp: 7 tablet, Rfl: 0   traZODone (DESYREL) 100 MG tablet, Take 1 tablet (100 mg total) by mouth at bedtime., Disp: 90 tablet, Rfl: 0  EXAM:  VITALS per patient if applicable:  GENERAL: alert, oriented, appears well and in no acute distress  HEENT: atraumatic, conjunttiva clear, no obvious abnormalities on inspection of external nose and ears  NECK: normal movements of the head and neck  LUNGS: on inspection no signs of respiratory distress, breathing rate appears normal, no obvious gross SOB, gasping or wheezing  CV: no obvious cyanosis  MS: moves all visible extremities without noticeable abnormality  PSYCH/NEURO: pleasant and cooperative, no obvious depression or anxiety, speech and thought processing grossly intact  ASSESSMENT AND PLAN:  Discussed the following assessment and plan:  1. Upper respiratory tract infection, unspecified type  - amoxicillin-clavulanate (AUGMENTIN) 875-125 MG tablet; Take 1 tablet by mouth 2 (two) times daily.  Dispense: 20 tablet; Refill: 0 - benzonatate (TESSALON) 200 MG capsule; Take 1 capsule (200 mg total) by mouth 2 (two) times daily as needed for cough.  Dispense: 20 capsule; Refill: 1       I discussed the assessment and treatment plan with the patient. The patient was provided an  opportunity to ask questions and all were answered. The patient agreed with the plan and demonstrated an understanding of the instructions.   The patient was advised to call back or seek an in-person evaluation if the symptoms worsen or if the condition fails to improve as anticipated.   Dorothyann Peng, NP

## 2022-01-07 ENCOUNTER — Other Ambulatory Visit: Payer: Self-pay | Admitting: Adult Health

## 2022-01-09 ENCOUNTER — Telehealth: Payer: Self-pay

## 2022-01-09 NOTE — Telephone Encounter (Signed)
Called pt but no answer. Received a fax stating that the benzonatate capsules are not covered by pt's insurance.  Tommi Rumps advised that pt try OTC mucinex. Will call pt again.

## 2022-01-09 NOTE — Telephone Encounter (Signed)
Spoke to pt and she stated she paid out of pocket with the help of the savings card. No further action needed.

## 2022-03-26 ENCOUNTER — Telehealth: Payer: Self-pay | Admitting: Adult Health

## 2022-03-26 NOTE — Telephone Encounter (Signed)
.  Patient mail document  collective service application , to be filled out by provider. Patient requested to send it via Call Patient to pick up within 7-days. Document is located in providers tray at front office.Please advise at Mobile [phone] (mobile)

## 2022-03-27 NOTE — Telephone Encounter (Signed)
PPW filled out and placed in front office filing cabinet. Tried to update pt but no answer.

## 2022-03-27 NOTE — Telephone Encounter (Signed)
Noted! PPW placed on providers desk 

## 2022-03-27 NOTE — Telephone Encounter (Signed)
Patient notified of update  and verbalized understanding. 

## 2022-03-31 ENCOUNTER — Encounter: Payer: Self-pay | Admitting: Adult Health

## 2022-03-31 ENCOUNTER — Ambulatory Visit (INDEPENDENT_AMBULATORY_CARE_PROVIDER_SITE_OTHER): Payer: Medicare HMO | Admitting: Adult Health

## 2022-03-31 VITALS — BP 133/88 | HR 76 | Temp 98.0°F | Ht 66.0 in | Wt 227.2 lb

## 2022-03-31 DIAGNOSIS — M65341 Trigger finger, right ring finger: Secondary | ICD-10-CM | POA: Diagnosis not present

## 2022-03-31 NOTE — Progress Notes (Signed)
Subjective:    Patient ID: Heather Gibson, female    DOB: 1950-06-16, 72 y.o.   MRN: [id number]  HPI 72 year old female who  has a past medical history of Arthritis, Constipation, GERD (gastroesophageal reflux disease), Hyperlipidemia, and Hypertension.  She presents to the office today for an acute issue. She reports that her right ring finger gets " locked up" and is painful. She reports that her symptoms have been present for the last few months and she was trying to deal with it but the pain has become too much.    Review of Systems See HPI   Past Medical History:  Diagnosis Date   Arthritis    Constipation    GERD (gastroesophageal reflux disease)    Hyperlipidemia    Hypertension     Social History   Socioeconomic History   Marital status: Married    Spouse name: Not on file   Number of children: Not on file   Years of education: Not on file   Highest education level: Not on file  Occupational History   Not on file  Tobacco Use   Smoking status: Never   Smokeless tobacco: Never  Vaping Use   Vaping Use: Never used  Substance and Sexual Activity   Alcohol use: No   Drug use: No   Sexual activity: Not Currently    Birth control/protection: Post-menopausal  Other Topics Concern   Not on file  Social History Narrative   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   Married for 14 years   Three Children ( all live locally)    Social Determinants of Health   Financial Resource Strain: [place]  (11/03/2021)   Overall Financial Resource Strain (CARDIA)    Difficulty of Paying Living Expenses: Not hard at all  Food Insecurity: No Food Insecurity (11/03/2021)   Hunger Vital Sign    Worried About Running Out of Food in the Last Year: Never true    Ran Out of Food in the Last Year: Never true  Transportation Needs: No Transportation Needs (11/03/2021)   PRAPARE - Hydrologist (Medical): No    Lack of  Transportation (Non-Medical): No  Physical Activity: Inactive (11/03/2021)   Exercise Vital Sign    Days of Exercise per Week: 0 days    Minutes of Exercise per Session: 0 min  Stress: No Stress Concern Present (11/03/2021)   [place]    Feeling of Stress : Not at all  Social Connections: Moderately Isolated (11/01/2020)   Social Connection and Isolation Panel [NHANES]    Frequency of Communication with Friends and Family: Twice a week    Frequency of Social Gatherings with Friends and Family: Twice a week    Attends Religious Services: More than 4 times per year    Active Member of [employer] or Organizations: No    Attends Archivist Meetings: Never    Marital Status: Separated  Intimate Partner Violence: Not At Risk (11/01/2020)   Humiliation, Afraid, Rape, and Kick questionnaire    Fear of Current or Ex-Partner: No    Emotionally Abused: No    Physically Abused: No    Sexually Abused: No    Past Surgical History:  Procedure Laterality Date   ABDOMINAL HYSTERECTOMY     COLONOSCOPY  2017   COLPORRHAPHY     PROLAPSED UTERINE FIBROID LIGATION     x2  SALPINGOOPHORECTOMY Right     Family History  Problem Relation Age of Onset   Breast cancer Mother        80s   Hypertension Mother    Schizophrenia Mother    Hypertension Maternal Grandmother    Diabetes Maternal Grandmother    Breast cancer Maternal Grandmother        ?   Colon cancer Neg Hx    Colon polyps Neg Hx    Esophageal cancer Neg Hx    Rectal cancer Neg Hx    Stomach cancer Neg Hx     Allergies  Allergen Reactions   Crestor [Rosuvastatin] Other (See Comments)    Myalgia    Egg-Derived Products     Years ago dx- eats egg now without any problems   Latex Swelling   Milk-Related Compounds     Cannot have large amt of milk products   Peanut-Containing Drug Products     Cannot have large amts of   Tramadol Itching   Shellfish  Allergy Itching    Current Outpatient Medications on File Prior to Visit  Medication Sig Dispense Refill   acetaminophen (TYLENOL) 325 MG tablet Take 650 mg by mouth every 6 (six) hours as needed.     amLODipine (NORVASC) 10 MG tablet TAKE 1 TABLET EVERY DAY 90 tablet 0   amoxicillin-clavulanate (AUGMENTIN) 875-125 MG tablet Take 1 tablet by mouth 2 (two) times daily. 20 tablet 0   aspirin EC 81 MG tablet Take 81 mg by mouth daily. Swallow whole.     atorvastatin (LIPITOR) 40 MG tablet TAKE 1 TABLET EVERY DAY 90 tablet 2   benzonatate (TESSALON) 200 MG capsule Take 1 capsule (200 mg total) by mouth 2 (two) times daily as needed for cough. 20 capsule 1   diclofenac sodium (VOLTAREN) 1 % GEL Apply 2 g topically 4 (four) times daily as needed. 100 g 3   Evolocumab (REPATHA SURECLICK) XX123456 MG/ML SOAJ Inject 1 Dose into the skin every 14 (fourteen) days. 6 mL 3   lisinopril-hydrochlorothiazide (ZESTORETIC) 20-25 MG tablet TAKE 1 TABLET EVERY DAY 90 tablet 0   omeprazole (PRILOSEC) 20 MG capsule TAKE 1 CAPSULE EVERY DAY 90 capsule 2   predniSONE (DELTASONE) 20 MG tablet Take 1 tablet (20 mg total) by mouth daily with breakfast. 7 tablet 0   traZODone (DESYREL) 100 MG tablet TAKE 1 TABLET(100 MG) BY MOUTH AT BEDTIME 90 tablet 0   No current facility-administered medications on file prior to visit.    BP 133/88 (BP Location: Left Arm, Patient Position: Sitting, Cuff Size: Large)   Pulse 76   Temp 98 F (36.7 C) (Oral)   Ht 5\' 6"  (1.676 m)   Wt 227 lb 3.2 oz (103.1 kg)   SpO2 97%   BMI 36.67 kg/m       Objective:   Physical Exam Vitals and nursing note reviewed.  Constitutional:      Appearance: Normal appearance.  Cardiovascular:     Rate and Rhythm: Normal rate and regular rhythm.     Pulses: Normal pulses.     Heart sounds: Normal heart sounds.  Pulmonary:     Effort: Pulmonary effort is normal.     Breath sounds: Normal breath sounds.  Musculoskeletal:     Comments: Trigger  finger noted to right ring finger. Painful nodule over A1 pulley.   Neurological:     General: No focal deficit present.     Mental Status: She is alert.  Psychiatric:  Mood and Affect: Mood normal.        Behavior: Behavior normal.        Thought Content: Thought content normal.        Judgment: Judgment normal.        Assessment & Plan:  1. Trigger ring finger of right hand - Ambulatory referral to Hand Surgery  Dorothyann Peng, NP

## 2022-04-09 DIAGNOSIS — M65341 Trigger finger, right ring finger: Secondary | ICD-10-CM | POA: Diagnosis not present

## 2022-04-09 DIAGNOSIS — M65331 Trigger finger, right middle finger: Secondary | ICD-10-CM | POA: Diagnosis not present

## 2022-04-16 ENCOUNTER — Ambulatory Visit (INDEPENDENT_AMBULATORY_CARE_PROVIDER_SITE_OTHER): Payer: Medicare HMO | Admitting: Adult Health

## 2022-04-16 ENCOUNTER — Encounter: Payer: Self-pay | Admitting: Adult Health

## 2022-04-16 ENCOUNTER — Telehealth: Payer: Self-pay | Admitting: Adult Health

## 2022-04-16 VITALS — BP 124/84 | HR 67 | Temp 98.0°F | Ht 64.5 in | Wt 223.0 lb

## 2022-04-16 DIAGNOSIS — M25561 Pain in right knee: Secondary | ICD-10-CM | POA: Diagnosis not present

## 2022-04-16 DIAGNOSIS — E785 Hyperlipidemia, unspecified: Secondary | ICD-10-CM

## 2022-04-16 DIAGNOSIS — Z0000 Encounter for general adult medical examination without abnormal findings: Secondary | ICD-10-CM

## 2022-04-16 DIAGNOSIS — F5101 Primary insomnia: Secondary | ICD-10-CM | POA: Diagnosis not present

## 2022-04-16 DIAGNOSIS — G8929 Other chronic pain: Secondary | ICD-10-CM | POA: Diagnosis not present

## 2022-04-16 DIAGNOSIS — Z6837 Body mass index (BMI) 37.0-37.9, adult: Secondary | ICD-10-CM | POA: Diagnosis not present

## 2022-04-16 DIAGNOSIS — I10 Essential (primary) hypertension: Secondary | ICD-10-CM | POA: Diagnosis not present

## 2022-04-16 LAB — CBC WITH DIFFERENTIAL/PLATELET
Basophils Absolute: 0.1 10*3/uL (ref 0.0–0.1)
Basophils Relative: 0.5 % (ref 0.0–3.0)
Eosinophils Absolute: 0.1 10*3/uL (ref 0.0–0.7)
Eosinophils Relative: 0.6 % (ref 0.0–5.0)
HCT: 42.6 % (ref 36.0–46.0)
Hemoglobin: 14.1 g/dL (ref 12.0–15.0)
Lymphocytes Relative: 19.3 % (ref 12.0–46.0)
Lymphs Abs: 2.6 10*3/uL (ref 0.7–4.0)
MCHC: 33.1 g/dL (ref 30.0–36.0)
MCV: 90.8 fl (ref 78.0–100.0)
Monocytes Absolute: 0.8 10*3/uL (ref 0.1–1.0)
Monocytes Relative: 5.7 % (ref 3.0–12.0)
Neutro Abs: 9.9 10*3/uL — ABNORMAL HIGH (ref 1.4–7.7)
Neutrophils Relative %: 73.9 % (ref 43.0–77.0)
Platelets: 437 10*3/uL — ABNORMAL HIGH (ref 150.0–400.0)
RBC: 4.7 Mil/uL (ref 3.87–5.11)
RDW: 14 % (ref 11.5–15.5)
WBC: 13.4 10*3/uL — ABNORMAL HIGH (ref 4.0–10.5)

## 2022-04-16 LAB — COMPREHENSIVE METABOLIC PANEL
ALT: 13 U/L (ref 0–35)
AST: 15 U/L (ref 0–37)
Albumin: 4.5 g/dL (ref 3.5–5.2)
Alkaline Phosphatase: 81 U/L (ref 39–117)
BUN: 16 mg/dL (ref 6–23)
CO2: 30 mEq/L (ref 19–32)
Calcium: 9.8 mg/dL (ref 8.4–10.5)
Chloride: 99 mEq/L (ref 96–112)
Creatinine, Ser: 0.83 mg/dL (ref 0.40–1.20)
GFR: 71.41 mL/min (ref >60.00)
Glucose, Bld: 93 mg/dL (ref 70–99)
Potassium: 3.3 mEq/L — ABNORMAL LOW (ref 3.5–5.1)
Sodium: 139 mEq/L (ref 135–145)
Total Bilirubin: 0.5 mg/dL (ref 0.2–1.2)
Total Protein: 7.8 g/dL (ref 6.0–8.3)

## 2022-04-16 LAB — LIPID PANEL
Cholesterol: 120 mg/dL (ref 0–200)
HDL: 65.2 mg/dL (ref >39.00)
LDL Cholesterol: 41 mg/dL (ref 0–99)
NonHDL: 54.82
Total CHOL/HDL Ratio: 2
Triglycerides: 70 mg/dL (ref 0.0–149.0)
VLDL: 14 mg/dL (ref 0.0–40.0)

## 2022-04-16 LAB — TSH: TSH: 0.55 u[IU]/mL (ref 0.35–5.50)

## 2022-04-16 NOTE — Progress Notes (Signed)
Subjective:    Patient ID: Heather Gibson, female    DOB: 21-Apr-1950, 72 y.o.   MRN: [id number]  HPI Patient presents for yearly preventative medicine examination. She is a pleasant 72 year old female who  has a past medical history of Arthritis, Constipation, GERD (gastroesophageal reflux disease), Hyperlipidemia, and Hypertension.  Essential Hypertension - Controlled with Norvasc 10 mg and lisinopril/HCTZ 20-25 mg. She denies dizziness, lightheadedness, chest pain, or shortness of breath  BP Readings from Last 3 Encounters:  04/16/22 124/84  03/31/22 133/88  05/20/21 125/67   GERD - takes Prilosec 20 mg daily  Hyperlipidemia -  Managed by Lipid Clinic with Repatha 140 mg every 14 days and Lipitor 40 mg daily due to suspected familial hypercholesterolemia. She had a CAC score of 229 in 09/2020 Lab Results  Component Value Date   CHOL 123 01/03/2021   HDL 57 01/03/2021   LDLCALC 51 01/03/2021   TRIG 73 01/03/2021   CHOLHDL 2.2 01/03/2021   Insomnia - takes Trazadone 100 mg QHS  Chronic right knee pain - Has had steroid injections in the past but these are no longer working well for her. She uses Voltaren Gel and Aleve and this produces some improvement. She does not want to have knee replacement surgery at this time.   All immunizations and health maintenance protocols were reviewed with the patient and needed orders were placed.  Appropriate screening laboratory values were ordered for the patient including screening of hyperlipidemia, renal function and hepatic function. If indicated by BPH, a PSA was ordered.  Medication reconciliation,  past medical history, social history, problem list and allergies were reviewed in detail with the patient  Goals were established with regard to weight loss, exercise, and  diet in compliance with medications Wt Readings from Last 3 Encounters:  04/16/22 223 lb (101.2 kg)  03/31/22 227 lb 3.2 oz (103.1 kg)  01/06/22 215 lb (97.5 kg)    She is up to date on routine colon cancer screening and mammograms.   Review of Systems  Constitutional: Negative.   HENT: Negative.    Eyes: Negative.   Respiratory: Negative.    Cardiovascular: Negative.   Gastrointestinal: Negative.   Endocrine: Negative.   Genitourinary: Negative.   Musculoskeletal:  Positive for arthralgias.  Skin: Negative.   Allergic/Immunologic: Negative.   Neurological: Negative.   Hematological: Negative.   Psychiatric/Behavioral: Negative.     Past Medical History:  Diagnosis Date   Arthritis    Constipation    GERD (gastroesophageal reflux disease)    Hyperlipidemia    Hypertension     Social History   Socioeconomic History   Marital status: Married    Spouse name: Not on file   Number of children: Not on file   Years of education: Not on file   Highest education level: Not on file  Occupational History   Not on file  Tobacco Use   Smoking status: Never   Smokeless tobacco: Never  Vaping Use   Vaping Use: Never used  Substance and Sexual Activity   Alcohol use: No   Drug use: No   Sexual activity: Not Currently    Birth control/protection: Post-menopausal  Other Topics Concern   Not on file  Social History Narrative   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   Married for 14 years   Three Children ( all live locally)    Social Determinants of Health   Financial Resource Strain: Low Risk  (  11/03/2021)   Overall Financial Resource Strain (CARDIA)    Difficulty of Paying Living Expenses: Not hard at all  Food Insecurity: No Food Insecurity (11/03/2021)   Hunger Vital Sign    Worried About Running Out of Food in the Last Year: Never true    Ran Out of Food in the Last Year: Never true  Transportation Needs: No Transportation Needs (11/03/2021)   PRAPARE - Administrator, Civil Service (Medical): No    Lack of Transportation (Non-Medical): No  Physical Activity: Inactive (11/03/2021)    Exercise Vital Sign    Days of Exercise per Week: 0 days    Minutes of Exercise per Session: 0 min  Stress: No Stress Concern Present (11/03/2021)   [employer] of Occupational Health - Occupational Stress Questionnaire    Feeling of Stress : Not at all  Social Connections: Moderately Isolated (11/01/2020)   Social Connection and Isolation Panel [NHANES]    Frequency of Communication with Friends and Family: Twice a week    Frequency of Social Gatherings with Friends and Family: Twice a week    Attends Religious Services: More than 4 times per year    Active Member of [employer] or Organizations: No    Attends Banker Meetings: Never    Marital Status: Separated  Intimate Partner Violence: Not At Risk (11/01/2020)   Humiliation, Afraid, Rape, and Kick questionnaire    Fear of Current or Ex-Partner: No    Emotionally Abused: No    Physically Abused: No    Sexually Abused: No    Past Surgical History:  Procedure Laterality Date   ABDOMINAL HYSTERECTOMY     COLONOSCOPY  2017   COLPORRHAPHY     PROLAPSED UTERINE FIBROID LIGATION     x2   SALPINGOOPHORECTOMY Right     Family History  Problem Relation Age of Onset   Breast cancer Mother        69s   Hypertension Mother    Schizophrenia Mother    Hypertension Maternal Grandmother    Diabetes Maternal Grandmother    Breast cancer Maternal Grandmother        ?   Colon cancer Neg Hx    Colon polyps Neg Hx    Esophageal cancer Neg Hx    Rectal cancer Neg Hx    Stomach cancer Neg Hx     Allergies  Allergen Reactions   Crestor [Rosuvastatin] Other (See Comments)    Myalgia    Egg-Derived Products     Years ago dx- eats egg now without any problems   Latex Swelling   Milk-Related Compounds     Cannot have large amt of milk products   Peanut-Containing Drug Products     Cannot have large amts of   Tramadol Itching   Shellfish Allergy Itching    Current Outpatient Medications on File Prior to Visit   Medication Sig Dispense Refill   acetaminophen (TYLENOL) 325 MG tablet Take 650 mg by mouth every 6 (six) hours as needed.     amLODipine (NORVASC) 10 MG tablet TAKE 1 TABLET EVERY DAY 90 tablet 0   aspirin EC 81 MG tablet Take 81 mg by mouth daily. Swallow whole.     atorvastatin (LIPITOR) 40 MG tablet TAKE 1 TABLET EVERY DAY 90 tablet 2   diclofenac sodium (VOLTAREN) 1 % GEL Apply 2 g topically 4 (four) times daily as needed. 100 g 3   Evolocumab (REPATHA SURECLICK) 140 MG/ML SOAJ Inject 1 Dose into  the skin every 14 (fourteen) days. 6 mL 3   lisinopril-hydrochlorothiazide (ZESTORETIC) 20-25 MG tablet TAKE 1 TABLET EVERY DAY 90 tablet 0   omeprazole (PRILOSEC) 20 MG capsule TAKE 1 CAPSULE EVERY DAY 90 capsule 2   traZODone (DESYREL) 100 MG tablet TAKE 1 TABLET(100 MG) BY MOUTH AT BEDTIME 90 tablet 0   No current facility-administered medications on file prior to visit.    BP 124/84   Pulse 67   Temp 98 F (36.7 C) (Oral)   Ht 5' 4.5" (1.638 m)   Wt 223 lb (101.2 kg)   SpO2 99%   BMI 37.69 kg/m        Objective:   Physical Exam Vitals and nursing note reviewed.  Constitutional:      General: She is not in acute distress.    Appearance: Normal appearance. She is obese. She is not ill-appearing.  HENT:     Head: Normocephalic and atraumatic.     Right Ear: Tympanic membrane, ear canal and external ear normal. There is no impacted cerumen.     Left Ear: Tympanic membrane, ear canal and external ear normal. There is no impacted cerumen.     Nose: Nose normal. No congestion or rhinorrhea.     Mouth/Throat:     Mouth: Mucous membranes are moist.     Pharynx: Oropharynx is clear.  Eyes:     Extraocular Movements: Extraocular movements intact.     Conjunctiva/sclera: Conjunctivae normal.     Pupils: Pupils are equal, round, and reactive to light.  Neck:     Vascular: No carotid bruit.  Cardiovascular:     Rate and Rhythm: Normal rate and regular rhythm.     Pulses: Normal  pulses.     Heart sounds: No murmur heard.    No friction rub. No gallop.  Pulmonary:     Effort: Pulmonary effort is normal.     Breath sounds: Normal breath sounds.  Abdominal:     General: Abdomen is flat. Bowel sounds are normal. There is no distension.     Palpations: Abdomen is soft. There is no mass.     Tenderness: There is no abdominal tenderness. There is no guarding or rebound.     Hernia: No hernia is present.  Musculoskeletal:        General: Tenderness present. Normal range of motion.     Cervical back: Normal range of motion and neck supple.  Lymphadenopathy:     Cervical: No cervical adenopathy.  Skin:    General: Skin is warm and dry.     Capillary Refill: Capillary refill takes less than 2 seconds.  Neurological:     General: No focal deficit present.     Mental Status: She is alert and oriented to person, place, and time.  Psychiatric:        Mood and Affect: Mood normal.        Behavior: Behavior normal.        Thought Content: Thought content normal.        Judgment: Judgment normal.        Assessment & Plan:  1. Routine general medical examination at a health care facility Today patient counseled on age appropriate routine health concerns for screening and prevention, each reviewed and up to date or declined. Immunizations reviewed and up to date or declined. Labs ordered and reviewed. Risk factors for depression reviewed and negative. Hearing function and visual acuity are intact. ADLs screened and addressed as needed. Functional ability  and level of safety reviewed and appropriate. Education, counseling and referrals performed based on assessed risks today. Patient provided with a copy of personalized plan for preventive services.   2. Hyperlipidemia, unspecified hyperlipidemia type - Per lipid clinic  - CBC with Differential/Platelet; Future - Comprehensive metabolic panel; Future - Lipid panel; Future - TSH; Future  3. Primary insomnia - Continue  with Trazodone  - CBC with Differential/Platelet; Future - Comprehensive metabolic panel; Future - Lipid panel; Future - TSH; Future  4. Essential hypertension - well controlled. No change in medication  - CBC with Differential/Platelet; Future - Comprehensive metabolic panel; Future - Lipid panel; Future - TSH; Future  5. Chronic pain of right knee - She will let me know when she is ready to be seen by orthopedics. For the time being can take Aleve and use Voltaren Gel    6. Class 2 severe obesity due to excess calories with serious comorbidity and body mass index (BMI) of 37.0 to 37.9 in adult - encouraged weight loss through diet and exercise - CBC with Differential/Platelet; Future - Comprehensive metabolic panel; Future - Lipid panel; Future - TSH; Future - TSH - Lipid panel - Comprehensive metabolic panel - CBC with Differential/Platelet  Shirline Frees, NP

## 2022-04-16 NOTE — Telephone Encounter (Signed)
Pt is calling and would like blood work results 

## 2022-04-16 NOTE — Patient Instructions (Signed)
It was great seeing you today   We will follow up with you regarding your lab work   Please let me know if you need anything   

## 2022-04-17 ENCOUNTER — Other Ambulatory Visit: Payer: Self-pay | Admitting: Adult Health

## 2022-04-17 ENCOUNTER — Other Ambulatory Visit: Payer: Self-pay

## 2022-04-17 MED ORDER — POTASSIUM CHLORIDE CRYS ER 10 MEQ PO TBCR: 10.0000 meq | EXTENDED_RELEASE_TABLET | Freq: Every day | ORAL | 1 refills | Status: DC

## 2022-04-17 NOTE — Telephone Encounter (Signed)
Pt is calling and would like blood wor results

## 2022-04-17 NOTE — Telephone Encounter (Signed)
Patient notified of update  and verbalized understanding. 

## 2022-05-02 ENCOUNTER — Other Ambulatory Visit: Payer: Self-pay | Admitting: Adult Health

## 2022-05-02 ENCOUNTER — Other Ambulatory Visit: Payer: Self-pay | Admitting: Internal Medicine

## 2022-05-02 DIAGNOSIS — K219 Gastro-esophageal reflux disease without esophagitis: Secondary | ICD-10-CM

## 2022-05-02 DIAGNOSIS — I10 Essential (primary) hypertension: Secondary | ICD-10-CM

## 2022-05-04 IMAGING — CT CT CARDIAC CORONARY ARTERY CALCIUM SCORE
3 series · 14 of 20 positions shown, 16 images · non-contrast
Comparison: None.
COMPARISON: None.

Addendum:
EXAM:
OVER-READ INTERPRETATION  CT CHEST

The following report is an over-read performed by radiologist Dr.
Rosalee Jim [REDACTED] on 09/11/2020. This over-read
does not include interpretation of cardiac or coronary anatomy or
pathology. The calcium score interpretation by the cardiologist is
attached.
CLINICAL DATA: 69F for cardiovascular disease risk stratification
Coronary Calcium Score
TECHNIQUE: A gated, non-contrast computed tomography scan of the heart was
performed using 3mm slice thickness. Axial images were analyzed on a
dedicated workstation. Calcium scoring of the coronary arteries was
performed using the Agatston method.

[Series 2: cascseq 2.0 sa36 70% (id) · axial · 0.38mm/px · z∈[-247,-157]mm · 4 of 76 slices shown]
[im 16/76  vessel]
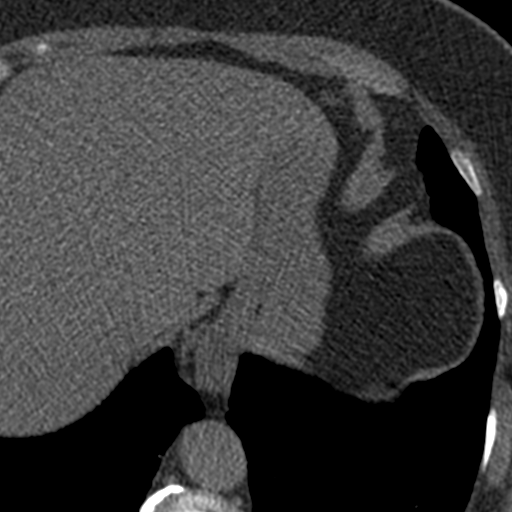
[im 31/76  vessel]
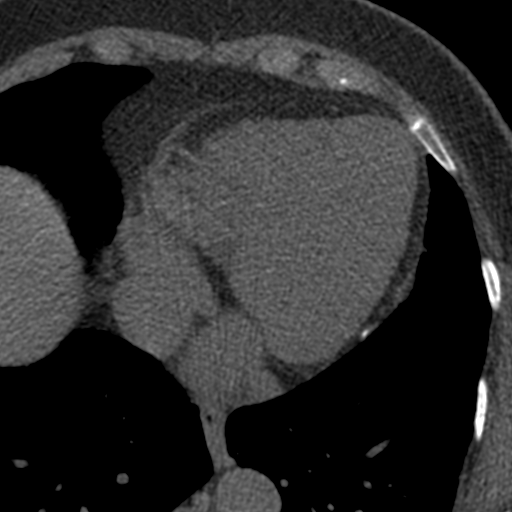
[im 46/76  vessel]
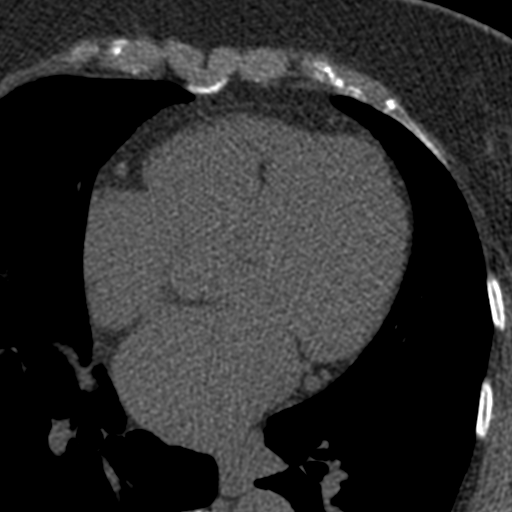
[im 61/76  vessel]
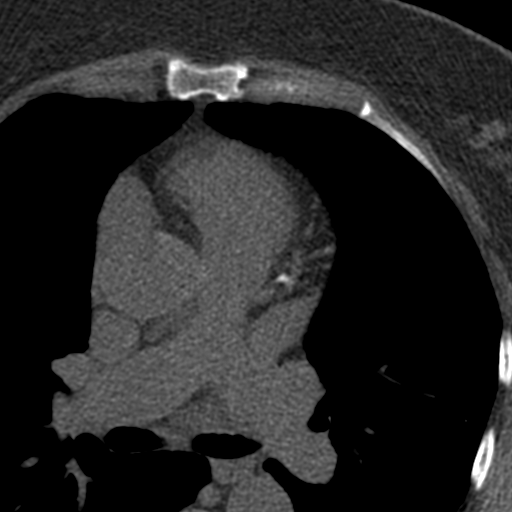

[Series 3: cascseq 2.0 bf37 st · axial · 0.67mm/px · z∈[-253,-153]mm · 5 of 76 slices shown, 7 images]
[im 13/76  vessel]
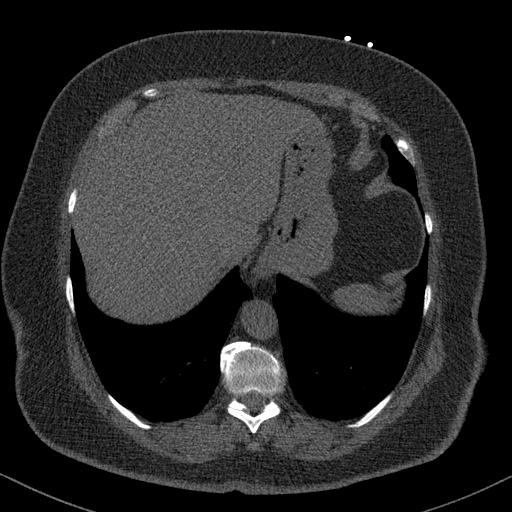
[im 13/76  lung]
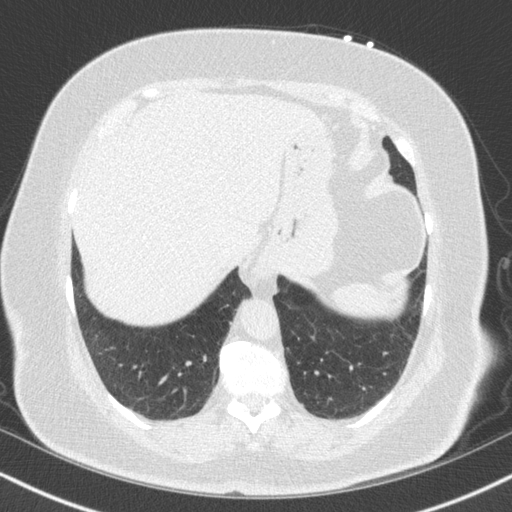
[im 26/76  vessel]
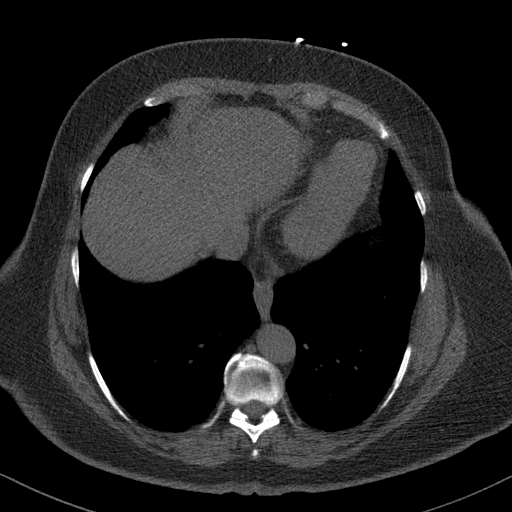
[im 38/76  vessel]
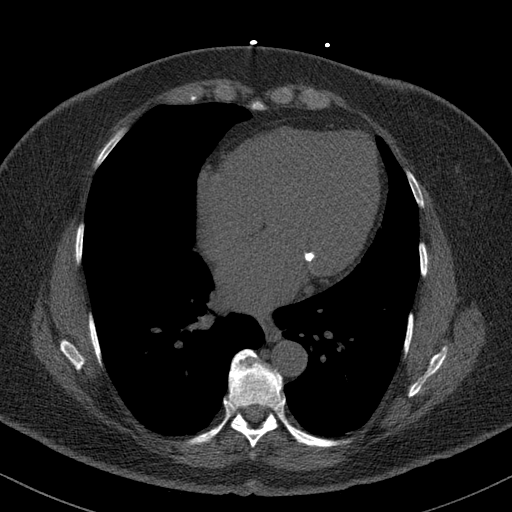
[im 51/76  vessel]
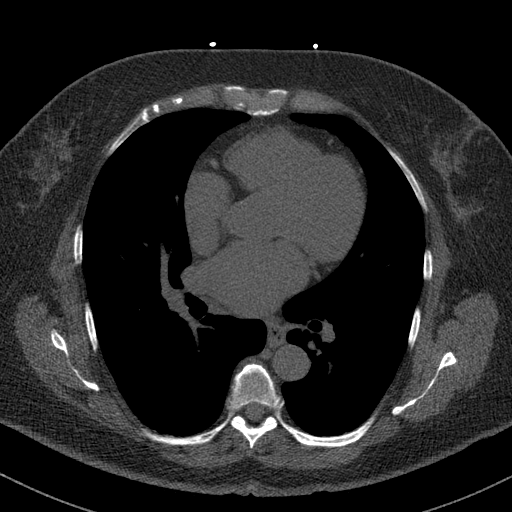
[im 63/76  vessel]
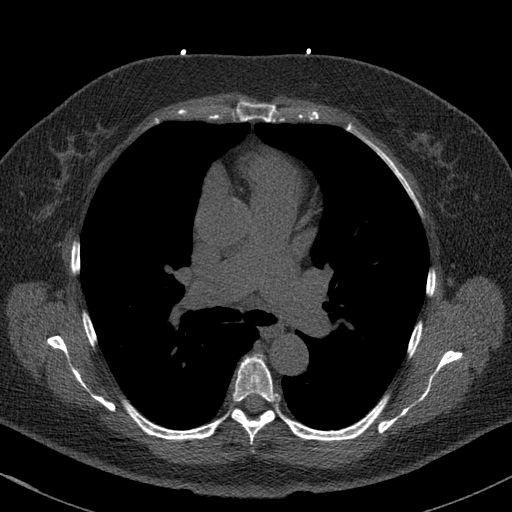
[im 63/76  lung]
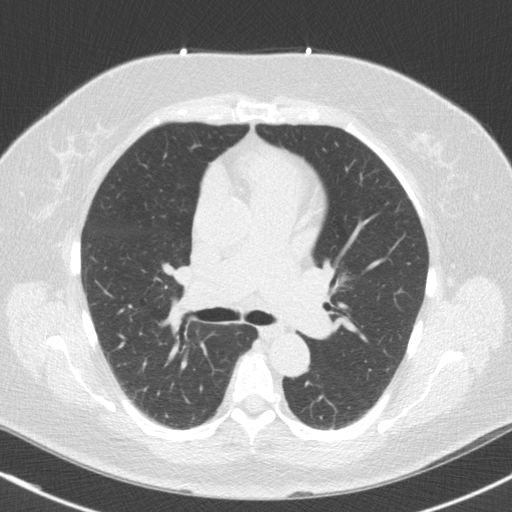

[Series 4: cascseq 2.0 br59 lung · axial · 0.67mm/px · z∈[-253,-153]mm · 5 of 76 slices shown]
[im 13/76  lung]
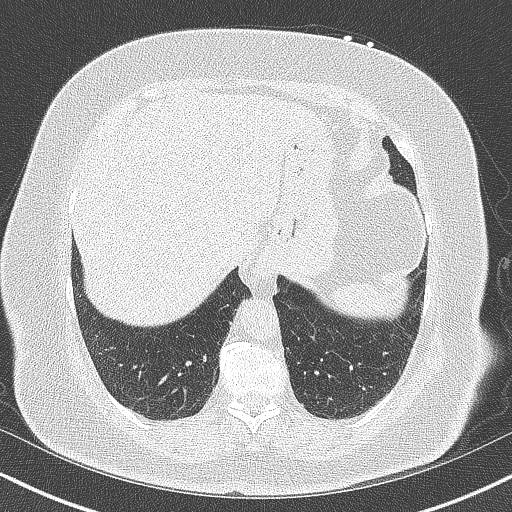
[im 26/76  lung]
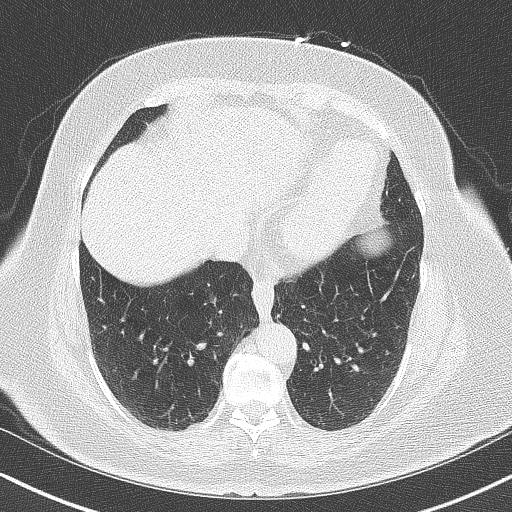
[im 38/76  lung]
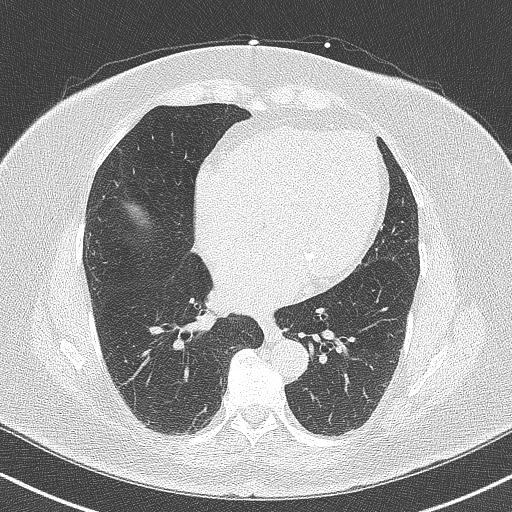
[im 51/76  lung]
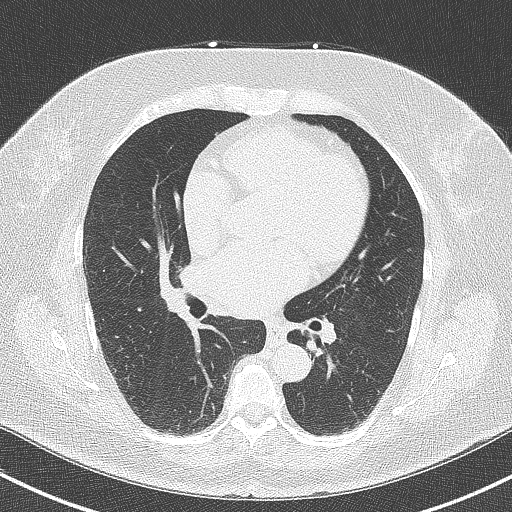
[im 63/76  lung]
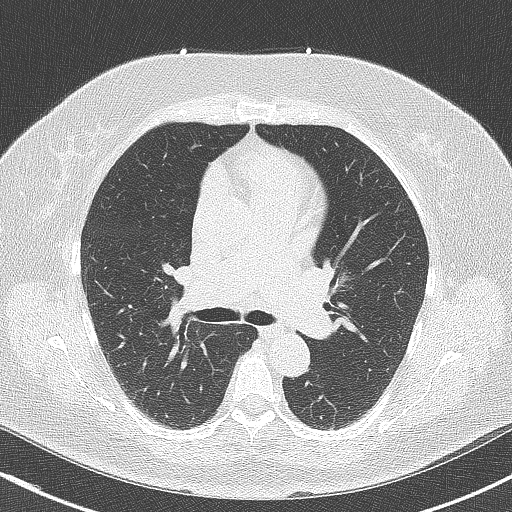

[14 of 20 positions shown; findings below may reference images not displayed]

FINDINGS: Vascular: Aortic atherosclerosis. Pulmonary artery enlargement,
outflow tract 3.1 cm

Mediastinum/Nodes: No imaged thoracic adenopathy.

Lungs/Pleura: No pleural fluid. Scattered calcified granulomas
bilaterally.

Upper Abdomen: Normal imaged portions of the liver, spleen, stomach.

Musculoskeletal: Thoracic spondylosis.
IMPRESSION: 1.  No acute findings in the imaged extracardiac chest.
2. Pulmonary artery enlargement suggests pulmonary arterial
hypertension.
3.  Aortic Atherosclerosis (F3RUR-XFY.Y).
FINDINGS: Coronary arteries: Normal origins.  Left dominance.

Coronary Calcium Score:

Left main: 0

Left anterior descending artery:

Left circumflex artery:

Right coronary artery: 156

Total: 229

Percentile: 89th

Pericardium: Normal.

Ascending Aorta: Normal caliber.  Atherosclerosis of the aorta.

Mitral annular calcification.

Non-cardiac: See separate report from [REDACTED].
IMPRESSION: Coronary calcium score of 229. This was 89th percentile for age-,
race-, and sex-matched controls.

Mitral annular calcification.

Atherosclerosis of the aorta.



If CAC=0, it is reasonable to withhold statin therapy and reassess
in 5 to 10 years, as long as higher risk conditions are absent
(diabetes mellitus, family history of premature CHD in first degree
relatives (males <55 years; females <65 years), cigarette smoking,
or LDL >=190 mg/dL).

If CAC is 1 to 99, it is reasonable to initiate statin therapy for
patients >=55 years of age.

If CAC is >=100 or >=75th percentile, it is reasonable to initiate
statin therapy at any age.

Cardiology referral should be considered for patients with CAC
scores >=400 or >=75th percentile.

*2884 AHA/ACC/AACVPR/AAPA/ABC/HARGROVE/CANERR/WILMER/[place]/ARHAM/MARQUITO/UC
Guideline on the Management of Blood Cholesterol: A Report of the
American College of Cardiology/American Heart Association Task Force
on Clinical Practice Guidelines. J Am Coll Cardiol.
1729;73(24):2235-2433.

*** End of Addendum ***
EXAM:
OVER-READ INTERPRETATION  CT CHEST

The following report is an over-read performed by radiologist Dr.
Rosalee Jim [REDACTED] on 09/11/2020. This over-read
does not include interpretation of cardiac or coronary anatomy or
pathology. The calcium score interpretation by the cardiologist is
attached.
FINDINGS: Vascular: Aortic atherosclerosis. Pulmonary artery enlargement,
outflow tract 3.1 cm

Mediastinum/Nodes: No imaged thoracic adenopathy.

Lungs/Pleura: No pleural fluid. Scattered calcified granulomas
bilaterally.

Upper Abdomen: Normal imaged portions of the liver, spleen, stomach.

Musculoskeletal: Thoracic spondylosis.
IMPRESSION: 1.  No acute findings in the imaged extracardiac chest.
2. Pulmonary artery enlargement suggests pulmonary arterial
hypertension.
3.  Aortic Atherosclerosis (F3RUR-XFY.Y).

## 2022-05-06 ENCOUNTER — Telehealth: Payer: Self-pay | Admitting: Adult Health

## 2022-05-06 NOTE — Telephone Encounter (Signed)
Pt called to FU on these refill requests and also wanted to add Trazodone to the list.  Please advise.

## 2022-05-07 NOTE — Telephone Encounter (Signed)
Error. Please disregard

## 2022-05-21 DIAGNOSIS — M65341 Trigger finger, right ring finger: Secondary | ICD-10-CM | POA: Diagnosis not present

## 2022-05-21 DIAGNOSIS — M65331 Trigger finger, right middle finger: Secondary | ICD-10-CM | POA: Diagnosis not present

## 2022-06-05 ENCOUNTER — Encounter: Payer: Self-pay | Admitting: Internal Medicine

## 2022-06-15 ENCOUNTER — Other Ambulatory Visit: Payer: Self-pay | Admitting: Adult Health

## 2022-06-15 DIAGNOSIS — R928 Other abnormal and inconclusive findings on diagnostic imaging of breast: Secondary | ICD-10-CM

## 2022-06-15 DIAGNOSIS — Z1231 Encounter for screening mammogram for malignant neoplasm of breast: Secondary | ICD-10-CM

## 2022-06-25 ENCOUNTER — Telehealth: Payer: Self-pay | Admitting: Adult Health

## 2022-06-25 ENCOUNTER — Other Ambulatory Visit: Payer: Self-pay | Admitting: Adult Health

## 2022-06-25 ENCOUNTER — Ambulatory Visit
Admission: RE | Admit: 2022-06-25 | Discharge: 2022-06-25 | Disposition: A | Discharge status: Home / Self Care | Payer: Medicare HMO | Source: Ambulatory Visit | Attending: Adult Health | Admitting: Adult Health

## 2022-06-25 DIAGNOSIS — R928 Other abnormal and inconclusive findings on diagnostic imaging of breast: Secondary | ICD-10-CM

## 2022-06-25 DIAGNOSIS — N6452 Nipple discharge: Secondary | ICD-10-CM

## 2022-06-25 NOTE — Telephone Encounter (Signed)
Breast center would like cory to order MRI bilateral due to left nipple discharge

## 2022-06-25 NOTE — Telephone Encounter (Signed)
Noted  

## 2022-06-25 NOTE — Telephone Encounter (Signed)
Nicky with breast center is calling and pt had diag mammo and ultrasound and pt need a bilateral MRI due to left nipple discharge . The report will be in [facility]

## 2022-08-05 ENCOUNTER — Other Ambulatory Visit: Payer: Medicare HMO

## 2022-08-09 NOTE — Progress Notes (Unsigned)
Cardiology Office Note:  .   Date:  08/12/2022  ID:  Heather Gibson, DOB 21-Jan-1950, MRN [id number] PCP: Karenann Cai, NP  [facility] Providers Cardiologist:  None    Patient Profile: .      PMH Mixed dyslipidemia/Suspect familial hypercholesterolemia Myalgia with rosuvastatin Hypertension Coronary calcium score 229 (89th percentile) Aortic atherosclerosis, mitral annular calcification, and pulmonary artery enlargement noted on CT  [facility] and seen by Dr. Rennis Golden on 10/20/2019.  Longstanding history of dyslipidemia with LDL cholesterol over 200 at times.  Myalgias with rosuvastatin.  Overall lives a sedentary lifestyle and eats a Southern diet with lots of fried foods. Does like vegetables.  She was started on atorvastatin which she has tolerated. Repatha was added with significant reduction in LDL. Aspirin was added due to elevated CAC and aortic atherosclerosis.        History of Present Illness: .   Heather Gibson is a very pleasant 72 y.o. female who returns today for annual follow-up of dyslipidemia.  She reports she is feeling well.  Has occasional episodes where she feels her heart racing.  Admits she is pretty sedentary. Frequently does housework and takes her dogs outside in the yard. She cannot walk in her neighborhood because of big dogs. Has [employer] and has considered participating in [employer].  Does not feel like she can walk a long distance right now due to plantar fasciitis. Is seeing a foot specialist. BP has been well controlled, usually 130/60-70 mmHG. We reviewed most recent lipid results that I have on file, but she reports more recent lab work from [facility]. I have asked her to have it faxed. She admits, "I know what to do for myself, I just need to make myself do it" in regards to healthy diet and regular exercise.   From [facility] 04/16/22: TC 120, LDL 41, triglycerides 70, HDL 62.2   ROS: See HPI        Studies Reviewed: .        Risk Assessment/Calculations:             Physical Exam:   VS:  BP 134/78   Pulse 75   Ht 5' 4.5" (1.638 m)   Wt 230 lb (104.3 kg)   BMI 38.87 kg/m    Wt Readings from Last 3 Encounters:  08/12/22 230 lb (104.3 kg)  04/16/22 223 lb (101.2 kg)  03/31/22 227 lb 3.2 oz (103.1 kg)    GEN: Obese, well developed in no acute distress NECK: No JVD; No carotid bruits CARDIAC: RRR, no murmurs, rubs, gallops RESPIRATORY:  Clear to auscultation without rales, wheezing or rhonchi  ABDOMEN: Soft, non-tender, non-distended EXTREMITIES:  No edema; No deformity     ASSESSMENT AND PLAN: .    Mixed dyslipidemia: Most recent lipid panel on file from April 2024 reveals LDL 41, triglycerides 70, HDL 65.2, very well controlled. She is tolerating atorvastatin and Repatha without adverse side effects.  She is overall feeling very well.  We will continue Repatha and atorvastatin.  Plan for 1 year follow-up. Heart healthy diet and regular exercise encouraged.   Coronary artery disease: CT calcium score 09/11/2020 with CAC score 229 (89th percentile), aortic atherosclerosis. She denies chest pain, dyspnea, or other symptoms concerning for angina.  No indication for further ischemic evaluation at this time.  Secondary prevention emphasized including 150 minutes of moderate intensity exercise each week along with heart healthy diet low in saturated fat  and cholesterol, sugar, processed foods, and simple carbohydrates.  No bleeding concerns on aspirin.  Continue aspirin, atorvastatin, Zestoretic, Repatha.  Hypertension: BP is initially elevated, but improved on my recheck. Management per PCP.        Dispo: 1 year with Dr. Rennis Golden or me  Signed, Eligha Bridegroom, NP-C

## 2022-08-12 ENCOUNTER — Ambulatory Visit (HOSPITAL_BASED_OUTPATIENT_CLINIC_OR_DEPARTMENT_OTHER): Payer: Medicare HMO | Admitting: Nurse Practitioner

## 2022-08-12 ENCOUNTER — Encounter (HOSPITAL_BASED_OUTPATIENT_CLINIC_OR_DEPARTMENT_OTHER): Payer: Self-pay | Admitting: Nurse Practitioner

## 2022-08-12 VITALS — BP 134/78 | HR 75 | Ht 64.5 in | Wt 230.0 lb

## 2022-08-12 DIAGNOSIS — T466X5A Adverse effect of antihyperlipidemic and antiarteriosclerotic drugs, initial encounter: Secondary | ICD-10-CM

## 2022-08-12 DIAGNOSIS — M791 Myalgia, unspecified site: Secondary | ICD-10-CM

## 2022-08-12 DIAGNOSIS — I700 Atherosclerosis of aorta: Secondary | ICD-10-CM | POA: Diagnosis not present

## 2022-08-12 DIAGNOSIS — R931 Abnormal findings on diagnostic imaging of heart and coronary circulation: Secondary | ICD-10-CM | POA: Diagnosis not present

## 2022-08-12 DIAGNOSIS — T466X5D Adverse effect of antihyperlipidemic and antiarteriosclerotic drugs, subsequent encounter: Secondary | ICD-10-CM

## 2022-08-12 DIAGNOSIS — E7801 Familial hypercholesterolemia: Secondary | ICD-10-CM

## 2022-08-12 NOTE — Patient Instructions (Addendum)
Medication Instructions:   Your physician recommends that you continue on your current medications as directed. Please refer to the Current Medication list given to you today.   *If you need a refill on your cardiac medications before your next appointment, please call your pharmacy*   Lab Work: Please send lab work to the [phone].  None ordered.  If you have labs (blood work) drawn today and your tests are completely normal, you will receive your results only by: MyChart Message (if you have MyChart) OR A paper copy in the mail If you have any lab test that is abnormal or we need to change your treatment, we will call you to review the results.   Testing/Procedures:  None ordered.   Follow-Up: At [facility], you and your health needs are our priority.  As part of our continuing mission to provide you with exceptional heart care, we have created designated Provider Care Teams.  These Care Teams include your primary Cardiologist (physician) and Advanced Practice Providers (APPs -  Physician Assistants and Nurse Practitioners) who all work together to provide you with the care you need, when you need it.  We recommend signing up for the patient portal called "MyChart".  Sign up information is provided on this After Visit Summary.  MyChart is used to connect with patients for Virtual Visits (Telemedicine).  Patients are able to view lab/test results, encounter notes, upcoming appointments, etc.  Non-urgent messages can be sent to your provider as well.   To learn more about what you can do with MyChart, go to [email or web address].    Your next appointment:   1 year(s)  Provider:   Eligha Bridegroom, NP    Other Instructions  Your physician wants you to follow-up in: 1 year . You will receive a reminder letter in the mail two months in advance. If you don't receive a letter, please call our office to schedule the follow-up appointment.

## 2022-08-14 ENCOUNTER — Encounter: Payer: Self-pay | Admitting: Adult Health

## 2022-08-19 ENCOUNTER — Ambulatory Visit
Admission: RE | Admit: 2022-08-19 | Discharge: 2022-08-19 | Disposition: A | Discharge status: Home / Self Care | Payer: Medicare HMO | Source: Ambulatory Visit | Attending: Adult Health | Admitting: Adult Health

## 2022-08-19 ENCOUNTER — Other Ambulatory Visit: Payer: Self-pay | Admitting: Adult Health

## 2022-08-19 DIAGNOSIS — N6452 Nipple discharge: Secondary | ICD-10-CM

## 2022-08-19 MED ORDER — GADOPICLENOL 0.5 MMOL/ML IV SOLN
10.0000 mL | Freq: Once | INTRAVENOUS | Status: AC | PRN
  Administered 2022-08-19: 10 mL via INTRAVENOUS

## 2022-08-20 ENCOUNTER — Telehealth: Payer: Self-pay | Admitting: Adult Health

## 2022-08-20 ENCOUNTER — Other Ambulatory Visit: Payer: Self-pay | Admitting: Adult Health

## 2022-08-20 DIAGNOSIS — N6342 Unspecified lump in left breast, subareolar: Secondary | ICD-10-CM

## 2022-08-20 NOTE — Telephone Encounter (Signed)
Tried to call pt but no answer

## 2022-08-20 NOTE — Telephone Encounter (Signed)
Pt is calling and would like breast mri result

## 2022-09-10 ENCOUNTER — Ambulatory Visit
Admission: RE | Admit: 2022-09-10 | Discharge: 2022-09-10 | Disposition: A | Discharge status: Home / Self Care | Payer: Medicare HMO | Source: Ambulatory Visit | Attending: Adult Health | Admitting: Adult Health

## 2022-09-10 ENCOUNTER — Other Ambulatory Visit: Payer: Medicare HMO

## 2022-09-10 ENCOUNTER — Other Ambulatory Visit: Payer: Self-pay | Admitting: Body Imaging

## 2022-09-10 DIAGNOSIS — N6342 Unspecified lump in left breast, subareolar: Secondary | ICD-10-CM

## 2022-09-10 MED ORDER — GADOPICLENOL 0.5 MMOL/ML IV SOLN
10.0000 mL | Freq: Once | INTRAVENOUS | Status: AC | PRN
  Administered 2022-09-10: 10 mL via INTRAVENOUS

## 2022-09-23 ENCOUNTER — Other Ambulatory Visit: Payer: Self-pay | Admitting: Internal Medicine

## 2023-02-24 ENCOUNTER — Other Ambulatory Visit: Payer: Self-pay

## 2023-02-24 NOTE — Patient Outreach (Signed)
Aging Gracefully Program  02/24/2023  Heather Gibson 07-Jan-1950 [id number]   [facility] Evaluation Interviewer attempted to call patient on today regarding Aging Gracefully referral. No answer from patient after multiple rings. CMA left confidential voicemail for patient to return call.  Will attempt to call back within 1 week.    [facility]  Population Health Care Management Assistant  Direct Dial: [phone]  Fax: [phone] Website: [email or web address]

## 2023-02-25 ENCOUNTER — Other Ambulatory Visit: Payer: Self-pay

## 2023-02-25 NOTE — Patient Outreach (Signed)
Aging Gracefully Program  02/25/2023  Heather Gibson [birth year] [id number]   [facility] Evaluation Interviewer made contact with patient. Aging Gracefully survey completed.   Interviewer will send referral to RN and OT for follow up.   [facility]  Population Health Care Management Assistant  Direct Dial: [phone]  Fax: [phone] Website: [email or web address]

## 2023-03-10 ENCOUNTER — Other Ambulatory Visit: Payer: Self-pay | Admitting: Occupational Therapy

## 2023-03-11 NOTE — Patient Outreach (Signed)
 Aging Gracefully Program  OT Initial Visit  03/11/2023  Heather Gibson Jul 08, 1950 [id number]  Visit:  1- Initial Visit  Start Time:  1405 End Time:  1530 Total Minutes:  85  CCAP: Typical Daily Routine: Typical Daily Routine:: Gets up, starts kettle for water for coffee, sits and relaxes, then gets ready if has somewhere to go, eats, chores around the house What Types Of Care Problems Are You Having Throughout The Day?: right hand and knee OA, gout in feet What Kind Of Help Do You Receive?: family helps some as they can What Do You Think Would Make Everyday Life Easier For You?: bigger walk in shower, if my nerves would be better, right hand working better What Is A Good Day Like?: Less pain What Is A Bad Day Like?: More pain Do You Have Time For Yourself?: yes Patient Reported Equipment: Patient Reported Equipment Currently Used: Single [employer] (but does not use often) Balance While Showering: Maintaining Balance While Showering: A Little Difficulty Do You:: No Device/No Assistance Importance Of Learning New Strategies:: Very Much Intervention: Yes Other Comments:: walk in shower , shower seat, grab bars, hand held shower Functional Mobility-Move In And Out Of Bath/Shower: Move In And Out Of A Bath/Shower: A Little Difficulty Do You:: No Device/No Assistance Importance Of Learning New Strategies:: Very Much Safety:  (shower is really small to get into and once in as well) Intervention: No Other Comments:: bigger walk in shower, grab bars, hand held shower, shower seat Functional Mobility-Get On And Off Toilet: Getting Up From The Floor: Moderate Difficulty Do You:: No Device/No Assistance Importance Of Learning New Strategies:: Very Much Intervention: Yes Other Comments:: explain and demonstrate getting up on off the floor Activities of Daily Living-Put On And Take Off Shirt/Dress/Coat (Incl. Fasteners): Put On And Take Off Shirt/Dress/Coat (Incl. Fasteners): A  Little Difficulty (with fasteners) Do You:: No Device/No Assistance Importance Of Learning New Strategies: Very Much Safety: No Risk Efficiency: Somewhat Intervention: Yes Other Comments:: hand exercises, button hook/zipper pull, can opener Instrumental Activities of Daily Living-Medications: Take Medications: A Little Difficulty (opening bottles due to hand weakness sometime shaking) Do You:: No Device/No Assistance Importance Of Learning New Strategies: Moderate Intervention: Yes (talk to RN about pillbox) Instrumental Activities of Daily Living-Other Identifies: Other IADL Identified: Getting items out of lower cabinets, organizing refrigerator Other IADL Identified Difficulty: Moderate Difficulty Do You:: No Device/No Assistance Importance Of Learning New Strategies:: Very Much Intervention: Yes Other Comments:: rollator she can sit on to do these tasks  Readiness To Change Score:  Readiness to Change Score: 10  Home Environment Assessment: Outside Home Entry:: A wonderful ramp that was put in place for her mother when she was living with Heather. Ramer. There is an uneven surface getting into the house from ramp Entryway/Foyer:: There is a step off from foyer into hallway Dining Room:: Threre is step off from dining room into [employer]:: Same as dining room above Stairs:: on back deck, but she does not go up and down them much Bathroom:: Master is small, with small tiled walk in shower and small opening. Standard height toliet; Hall bath larger with standard toliet and tub/shower combination Other Home Environment Concerns:: Back deck boards, boards of house chipping away, ceiling and floor in bedroom down hallway to left, celing in hall bath, and floor in hall bah  Goals:  Goals Addressed               This Visit's Progress  Patient Stated (pt-stated)        To feel more safe in bathroom: MASTER: comfort height toilet with grab bar in front of toilet. HALL: Tub to  shower conversion with grab bars, hand held shower with lower shower holder, shower seat. Comfort height toilet with possible raise and lower grab bar on the right of the toilet as you are sitting on it.      Patient Stated (pt-stated)        Safety around the house: rollator and leveling out areas of house where there are high threshold that rollator or a wheelchair cannot easily go over      Patient Stated (pt-stated)        Safety in and out of the house: leveling out thresholds at front and back door so rollator and wheelchair can get easily over.      Patient Stated (pt-stated)        More strength and coordination in hands: hand exercises, stretches, AE for easier opening of cans and for clothing fastners        Post Clinical Reasoning: Clinician View Of Client Situation:: It was a pleasure ot meet Heather Gibson today, Her lively spirit is contagious. She does well for herself keeping busy with church and family functions. She has some areas of pain (right hand, right knee, gout in feet). She also reports that she has "bad nerves" that make her have the shakes sometimes. She reports she has difficulty with balance sometimes and dropping things from her right hand. She has a walk in shower but it has a very narrow door and it is small, her other bathroom has a tub /shower combination. Client View Of His/Her Situation:: Heather Gibson feels like she does well for herself (and I agree). She does realize that some things are getting harder for her to do and that looking to the future there are things around her home that could make life easier for her. She has 2 dogs that she Malachi Bonds and [employer], Next Visit Plan:: Hand exercises  Lynden Ang, [place] Aging Gracefully [phone]

## 2023-03-12 ENCOUNTER — Other Ambulatory Visit: Payer: Self-pay | Admitting: *Deleted

## 2023-03-12 NOTE — Patient Outreach (Signed)
 Aging Gracefully Program  03/12/2023  Heather Gibson 04/10/1950 [id number]   Telephone call made to Heather Gibson to schedule Aging Gracefully RN home visit. No answer. HIPAA compliant voicemail message left to request return call. Will attempt outreach again at later time.  Raiford Noble, MSN, RN, BSN [place]  [facility], [facility] RN Case Manager for Aging Gracefully Direct Dial: [phone]

## 2023-03-15 ENCOUNTER — Other Ambulatory Visit: Payer: Self-pay | Admitting: *Deleted

## 2023-03-15 NOTE — Patient Outreach (Signed)
 Aging Gracefully Program  03/15/2023  HARLEE ECKROTH Jan 25, 1950 [id number]  Spoke with Mrs. Brittingham via phone to schedule Aging Gracefully RN home visit. Mrs. Diekman agreeable to meeting with Clinical research associate on tomorrow, Tuesday, March 11 at 10 am.   Raiford Noble, MSN, RN, BSN [place]  [facility], [facility] Commun[facility] RN Case Manager for Aging Gracefully Direct Dial: [phone]

## 2023-03-16 ENCOUNTER — Encounter: Payer: Self-pay | Admitting: *Deleted

## 2023-03-16 ENCOUNTER — Other Ambulatory Visit: Payer: Self-pay | Admitting: *Deleted

## 2023-03-16 NOTE — Patient Outreach (Signed)
 Aging Gracefully Program  RN Visit  03/16/2023  Heather Gibson [birth year] [id number]  Visit:  RN Visit Number: 1- Initial Visit  RN TIME CALCULATION: Start TIme:  RN Start Time Calculation: 1000 End Time:  RN Stop Time Calculation: 1200 Total Minutes:  RN Time Calculation: 120  Readiness To Change Score:  Readiness to Change Score: 10  Universal RN Interventions: Calendar Distribution: Yes Exercise Review: No Medications: Yes Medication Changes: No Mood: Yes Pain: Yes PCP Advocacy/Support: No Fall Prevention: Yes Clinician View Of Client Situation: Arrived for home visit. Heather Gibson answered the door with warm greeting and pleasant smile. Two dogs, in hallway, behind child's gate. Home clean, neat, and welcoming. Heather Gibson ambulates independently without assistive device. Client View Of His/Her Situation: Heather Gibson reports she is doing well overall. 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 She enjoys helping others, spending time with friends/family, and her dogs.  Healthcare Provider Communication: Did Surveyor, mining With [employer] Provider?: No According to Client, Did PCP Report Communication With An Aging Gracefully RN?: No  Clinician View of Client Situation: Clinician View Of Client Situation: Arrived for home visit. Heather Gibson answered the door with warm greeting and pleasant smile. Two dogs, in hallway, behind child's gate. Home clean, neat, and welcoming. Heather Gibson ambulates independently without assistive device. Client's View of His/Her Situation: Client View Of His/Her Situation: Heather Gibson reports she is doing well overall. 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 She enjoys helping others, spending time with friends/family, and her dogs.  Medication Assessment: Do You Have Any Problems Paying For Medications?: No Where Does Client Store Medications?: Other: (in bedroom) Can Client Read Pill Bottles?: Yes Does Client Use A Pillbox?: No (pillbox  provided) Does Anyone Assist Client In Taking Medications?: No Do You Take Vitamin D?: No Total Number Of Medications That The Client Takes: 9 Does Client Have Any Questions Or Concerns About Medictions?: Yes Is Client Complaining Of Any Symptoms That Could Be Side Effects To Medications?: No Any Possible Changes In Medication Regimen?: No  OT Update: Pending [employer]' assessment and contracts  Session Summary: Heather Gibson is delightful and inspiring. She has positive outlook and strong faith. Denies pain or recent falls. States she usually takes Alleve for arthritic pain.   Goals Addressed               This Visit's Progress     AG GOAL (pt-stated)        03/16/23  Assessment: Heather Gibson recently started on Wegovy for weight loss. States she is looking forward to losing weight. Endorses eating twice a day and smaller portions. Endorses drinking sodas at times. Reports staying busy and enjoys helping others. 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 Heather Gibson states she feels comfortable with asking her PCP questions. States she plans to call PCP [facility]) to follow up on thyroid ultrasound being scheduled and medication for gout. States PCP told her that her thyroid was low and that they were going to schedule thyroid ultrasound. Heather Gibson states she has not heard back from the PCP office yet. Heather Gibson reports receiving her medications from mail pharmacy. Endorses having trouble opening pill bottles.   Interventions:  Encouraged Heather Gibson to cut back on sodas. Encouraged Heather Gibson to write down questions she has for PCP to keep track. Provided a pill box. Provided chronic conditions book with log and calendar. Provided writer's contact information.   Plan: Scheduled next home visit for April 15th  at 10 am.    CLIENT/RN ACTION PLAN - GENERIC - (smartphrase AGRNGENERIC)  Registered Nurse:  Raiford Noble Date: 03/16/23  [name] Client ID:    Target Area:  Heather Gibson   Why Problem May Occur: Lack of energy, mindset, and motivation   Target Goal: Lose weight and become more active over the next 160 days.    STRATEGIES Coping Strategies: Ideas  Consume less food Take weight loss medication   Decrease salt intake   Decrease soda intake   Eat smaller portions  Exercise Join Silver Sneakers exercise program,  Perform home exercises   Prevention Ideas                  PRACTICE It is important to practice the strategies so we can determine if they will be effective in helping to reach the goal.    Follow these specific recommendations:        If strategy does not work the first time, try it again.     We may make some changes over the next few sessions.      Raiford Noble, MSN, RN, BSN [place]  [facility], [facility] Commun[facility] RN Case Manager for Aging Gracefully Direct Dial: [phone]

## 2023-03-16 NOTE — Patient Instructions (Signed)
 Visit Information  Thank you for taking time to visit with me today. Please don't hesitate to contact me if I can be of assistance to you before our next scheduled home appointment.  Following are the goals we discussed today:  (Copy and paste patient goals from clinical care plan here)  Our next appointment is on April 15th at 10 am.  If you are experiencing a Mental Health or Behavioral Health Crisis or need someone to talk to, please call the Suicide and Crisis Lifeline: 988 call the [place] National Suicide Prevention Lifeline: (937)353-8208 or TTY: 2727563105 TTY 419-420-1986) to talk to a trained counselor call 1-800-273-TALK (toll free, 24 hour hotline) go to [facility] [address] [place] [phone]) call 911    Goals Addressed               This Visit's Progress     AG GOAL (pt-stated)        03/16/23  Assessment: Heather Gibson recently started on Wegovy for weight loss. States she is looking forward to losing weight. Endorses eating twice a day and smaller portions. Endorses drinking sodas at times. Reports staying busy and enjoys helping others. 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 Heather Gibson states she feels comfortable with asking her PCP questions. States she plans to call PCP [facility]) to follow up on thyroid ultrasound being scheduled and medication for gout. States PCP told her that her thyroid was low and that they were going to schedule thyroid ultrasound. Heather Gibson states she has not heard back from the PCP office yet. Heather Gibson reports receiving her medications from mail pharmacy. Endorses having trouble opening pill bottles.   Interventions:  Encouraged Heather Gibson to cut back on sodas. Encouraged Heather Gibson to write down questions she has for PCP to keep track. Provided a pill box. Provided chronic conditions book with log and calendar. Provided writer's contact information.   Plan: Scheduled  next home visit for April 15th at 10 am.    CLIENT/RN ACTION PLAN - GENERIC - (smartphrase AGRNGENERIC)  Registered Nurse:  Heather Gibson Date: 03/16/23  [name] Client ID:    Target Area:  [name]   Why Problem May Occur: Lack of energy, mindset, and motivation   Target Goal: Lose weight and become more active over the next 160 days.    STRATEGIES Coping Strategies: Ideas  Consume less food Take weight loss medication   Decrease salt intake   Decrease soda intake   Eat smaller portions  Exercise Join Silver Sneakers exercise program,  Perform home exercises   Prevention Ideas                  PRACTICE It is important to practice the strategies so we can determine if they will be effective in helping to reach the goal.    Follow these specific recommendations:        If strategy does not work the first time, try it again.     We may make some changes over the next few sessions.      Heather Noble, MSN, RN, BSN [place]  [facility], Healthy Communities RN Case Manager for Aging Gracefully Direct Dial: [phone]                                        Heather Noble, MSN,  RN, BSN [place]  [facility], Healthy Communities RN Case Manager for Aging Gracefully Direct Dial: [phone]

## 2023-03-23 ENCOUNTER — Telehealth: Payer: Self-pay | Admitting: Pharmacy Technician

## 2023-03-23 ENCOUNTER — Telehealth: Payer: Self-pay | Admitting: Internal Medicine

## 2023-03-23 ENCOUNTER — Other Ambulatory Visit (HOSPITAL_COMMUNITY): Payer: Self-pay

## 2023-03-23 NOTE — Telephone Encounter (Signed)
 Per chart review, cannot find where Reginal Lutes has been prescribed by Dr. Rennis Golden.   Will need to defer to ordering provider.

## 2023-03-23 NOTE — Telephone Encounter (Signed)
 Patient is now on wegovy 0.5mg /0.76ml 82ml/28ds and now she has [employer] and now it is saying this was denied and needs to be appealed. I do not see where this was started so I asked them to send me the denial and then I can try to appeal

## 2023-03-23 NOTE — Telephone Encounter (Signed)
   Hello, there is no record seen that the patient has had a heart attack, stroke, nor peripheral arterial disease and that was the reason it was denied. The scan is in media. Thank you

## 2023-03-23 NOTE — Telephone Encounter (Addendum)
 Received: Erskine Squibb B routed conversation to Rx Prior Auth Team2 hours ago (10:08 AM)   Andrey Campanile, Jasmin B2 hours ago (10:08 AM)   JW Pt c/o medication issue:   1. Name of Medication:    Semaglutide-Weight Management (WEGOVY) 0.5 MG/0.5ML SOAJ    2. How are you currently taking this medication (dosage and times per day)?   Yes   3. Are you having a reaction (difficulty breathing--STAT)?    4. What is your medication issue?    Patient/Insurance company called to get prior authorization for patient to get this medication. Patient wants prescription sent to [facility] - [place], [place] - [address] as they carry this medication.  Patient wants a call back to confirm medication sent.      **Patient is now on wegovy 0.5mg /0.64ml 26ml/28ds and now she has [employer] and now it is saying this was denied and needs to be appealed. I do not see where this was started so I asked them to send me the denial.

## 2023-03-23 NOTE — Telephone Encounter (Signed)
 Pt c/o medication issue:  1. Name of Medication:   Semaglutide-Weight Management (WEGOVY) 0.5 MG/0.5ML SOAJ   2. How are you currently taking this medication (dosage and times per day)?   Yes  3. Are you having a reaction (difficulty breathing--STAT)?   4. What is your medication issue?   Patient/Insurance company called to get prior authorization for patient to get this medication. Patient wants prescription sent to [facility] - [place], [place] - [address] as they carry this medication.  Patient wants a call back to confirm medication sent.

## 2023-03-24 NOTE — Telephone Encounter (Signed)
 The patient called back and said her primary doctor wrote the wegovy. She said she was told that if her heart doctor sent the pa then it would be approved. I explained what the insurance denial said. Pt said she was going to talk to her primary about getting another weight loss that insurance might cover

## 2023-04-20 ENCOUNTER — Encounter: Payer: Self-pay | Admitting: *Deleted

## 2023-04-20 ENCOUNTER — Other Ambulatory Visit: Payer: Self-pay | Admitting: *Deleted

## 2023-04-20 NOTE — Patient Outreach (Signed)
 Aging Gracefully Program  RN Visit  04/20/2023  MAUD RUBENDALL Jun 13, 1950 [id number]  Visit:  RN Visit Number: 2- Second Visit  RN TIME CALCULATION: Start TIme:  RN Start Time Calculation: 1000 End Time:  RN Stop Time Calculation: 1100 Total Minutes:  RN Time Calculation: 60  Readiness To Change Score:  Readiness to Change Score: 10  Universal RN Interventions: Calendar Distribution: No Exercise Review: Yes Medications: Yes Medication Changes: Yes Mood: Yes Pain: Yes PCP Advocacy/Support: No Fall Prevention: Yes Incontinence: Yes Clinician View Of Client Situation: Arrived for home visit. Mrs. Adcox waiting for Clinical research associate at front door with pleasant smile and warm demeanor. Ambulates independently without assistive device. Client View Of His/Her Situation: Mrs. Tanney reports insurance not covering [place] any longer. States she changed insurance from [place] to [facility] and [facility] will not cover. States she is disappointed but is making necessary adjustments.  Healthcare Provider Communication: Did Surveyor, mining With [employer] Provider?: No Healthcare Provider Response According to RN: N/A According to Client, Did PCP Report Communication With An Aging Gracefully RN?: No Healthcare Provider Response According To Client: N/A  Clinician View of Client Situation: Clinician View Of Client Situation: Arrived for home visit. Mrs. Hewson waiting for Clinical research associate at front door with pleasant smile and warm demeanor. Ambulates independently without assistive device. Client's View of His/Her Situation: Client View Of His/Her Situation: Mrs. Vanzee reports insurance not covering [place] any longer. States she changed insurance from [place] to [facility] and [facility] will not cover. States she is disappointed but is making necessary adjustments.  Medication Assessment: Reviewed    OT Update: Pending [facility] contracts and assessments.  Session Summary: Mrs. Graffius is doing well over all. Reports pain 2  out of 10. Denies any falls. Looking forward to [facility] beginning home renovations and repairs.   Goals Addressed               This Visit's Progress     AG RN (pt-stated)        03/16/23  Assessment: Mrs. Gerety recently started on Wegovy for weight loss. States she is looking forward to losing weight. Endorses eating twice a day and smaller portions. Endorses drinking sodas at times. Reports staying busy and enjoys helping others. 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 Bruster states she feels comfortable with asking her PCP questions. States she plans to call PCP [facility]) to follow up on thyroid ultrasound being scheduled and medication for gout. States PCP told her that her thyroid was low and that they were going to schedule thyroid ultrasound. Mrs. Plate states she has not heard back from the PCP office yet. Mrs. Rayborn reports receiving her medications from mail pharmacy. Endorses having trouble opening pill bottles.   Interventions:  Encouraged Mrs. Garin to cut back on sodas. Encouraged Mrs. Bekker to write down questions she has for PCP to keep track. Provided a pill box. Provided chronic conditions book with log and calendar. Provided writer's contact information.   Plan: Scheduled next home visit for April 15th at 10 am.    CLIENT/RN ACTION PLAN - GENERIC - (smartphrase AGRNGENERIC)  Registered Nurse:  Raiford Noble Date: 03/16/23  Client Name: Janora Norlander Client ID:    Target Area:  [name]   Why Problem May Occur: Lack of energy, mindset, and motivation   Target Goal: Lose weight and become more active over the next 160 days.    STRATEGIES Coping Strategies: Ideas  Consume less food Take weight loss medication  Reviewed 03/16/23 Decrease salt intake  Reviewed 04/20/23 Decrease soda intake   Eat smaller portions  Exercise Reviewed 03/16/23 Reviewed 04/20/23 Join Silver Sneakers exercise program,  Perform home exercises   Prevention Ideas                   PRACTICE It is important to practice the strategies so we can determine if they will be effective in helping to reach the goal.    Follow these specific recommendations:        If strategy does not work the first time, try it again.     We may make some changes over the next few sessions.      Nolberto Batty, MSN, RN, BSN [facility], Healthy Communities RN Case Manager for Aging Gracefully Direct Dial: [phone]    04/20/2023   Assessment: Mrs. Gilpatrick reports [employer],will not cover [id number]. States she changed insurance from [place] to [facility] recently and [facility] does not cover the medication. States appeal was done and was still denied. Mrs. Mackins states she is disappointed but has continued to make dietary adjustments. States she was prescribed a new medication pill that aides in weight loss. She is hopeful. Reports she has been eating smaller portions, choosing healthier food options, and practicing encouraging self talk. States she has not gone to the gym yet for [employer]. Unfortunately, she has had several issues to come up that required her time and energy. States she plans to go to the gym or recreation center with a friend. Reports thyroid ultrasound is scheduled for May 22nd. Also states PCP started her on medication for gout. PCP appointment is scheduled for May 19th.   Interventions: Performed home exercises. Mrs. Altier participated with return demonstration of home exercises. Exercise booklet provided. Discussed stress relief strategies such as deep breathing exercises. Encouraged Mrs. Schouten to engage with [employer] program or recreation center [employer].   Plan: Scheduled next home visit for May 14th at 10 am. Mrs. Plouffe has writer's contact information.   Nolberto Batty, MSN, RN, BSN [place]  [facility], Healthy Communities RN Case Manager for Aging Gracefully Direct Dial: [phone]                                                                Nolberto Batty, MSN, RN, BSN [place]  [facility], Healthy Communities RN Case Manager for Aging Gracefully Direct Dial: [phone]

## 2023-04-20 NOTE — Patient Instructions (Signed)
 Visit Information  Thank you for taking time to visit with me today. Please don't hesitate to contact me if I can be of assistance to you before our next scheduled home appointment.  Following are the goals we discussed today:  (Copy and paste patient goals from clinical care plan here)  Our next appointment is on May 19, 2023 at 10 am  If you are experiencing a Mental Health or Behavioral Health Crisis or need someone to talk to, please call the Suicide and Crisis Lifeline: 988 call the [place] National Suicide Prevention Lifeline: 978-263-7639 or TTY: 513-177-5347 TTY 281-086-7657) to talk to a trained counselor call 1-800-273-TALK (toll free, 24 hour hotline) go to [facility] [address] [place] [phone]) call 911   The patient verbalized understanding of instructions, educational materials, and care plan provided today and agreed to receive a mailed copy of patient instructions, educational materials, and care plan.    Goals Addressed               This Visit's Progress     AG RN (pt-stated)        03/16/23  Assessment: Mrs. Mccalip recently started on Wegovy for weight loss. States she is looking forward to losing weight. Endorses eating twice a day and smaller portions. Endorses drinking sodas at times. Reports staying busy and enjoys helping others. 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 Longie states she feels comfortable with asking her PCP questions. States she plans to call PCP [facility]) to follow up on thyroid ultrasound being scheduled and medication for gout. States PCP told her that her thyroid was low and that they were going to schedule thyroid ultrasound. Mrs. Fruge states she has not heard back from the PCP office yet. Mrs. Corey reports receiving her medications from mail pharmacy. Endorses having trouble opening pill bottles.   Interventions:  Encouraged Mrs. Milles to cut back on sodas. Encouraged  Mrs. Rozier to write down questions she has for PCP to keep track. Provided a pill box. Provided chronic conditions book with log and calendar. Provided writer's contact information.   Plan: Scheduled next home visit for April 15th at 10 am.    CLIENT/RN ACTION PLAN - GENERIC - (smartphrase AGRNGENERIC)  Registered Nurse:  Raiford Noble Date: 03/16/23  Client Name: Janora Norlander Client ID:    Target Area:  [name]   Why Problem May Occur: Lack of energy, mindset, and motivation   Target Goal: Lose weight and become more active over the next 160 days.    STRATEGIES Coping Strategies: Ideas  Consume less food Take weight loss medication  Reviewed 03/16/23 Decrease salt intake  Reviewed 04/20/23 Decrease soda intake   Eat smaller portions  Exercise Reviewed 03/16/23 Reviewed 04/20/23 Join Silver Sneakers exercise program,  Perform home exercises   Prevention Ideas                  PRACTICE It is important to practice the strategies so we can determine if they will be effective in helping to reach the goal.    Follow these specific recommendations:        If strategy does not work the first time, try it again.     We may make some changes over the next few sessions.      Raiford Noble, MSN, RN, BSN [place]  [facility], Healthy Communities RN Case Manager for Aging Gracefully Direct Dial: [phone]    04/20/2023  Assessment: Mrs. Sena reports UHC,will not cover [id number]. States she changed insurance from [place] to [facility] recently and [facility] does not cover the medication. States appeal was done and was still denied. Mrs. Maiorino states she is disappointed but has continued to make dietary adjustments. States she was prescribed a new medication pill that aides in weight loss. She is hopeful. Reports she has been eating smaller portions, choosing healthier food options, and practicing encouraging self talk. States she has not gone to the gym yet for [employer]. Unfortunately, she has had several issues to come up that required her time and energy. States she plans to go to the gym or recreation center with a friend. Reports thyroid ultrasound is scheduled for May 22nd. Also states PCP started her on medication for gout. PCP appointment is scheduled for May 19th.   Interventions: Performed home exercises. Mrs. Liverman participated with return demonstration of home exercises. Exercise booklet provided. Discussed stress relief strategies such as deep breathing exercises. Encouraged Mrs. Rodeheaver to engage with [employer] program or recreation center [employer].   Plan: Scheduled next home visit for May 14th at 10 am. Mrs. Blizard has writer's contact information.   Nolberto Batty, MSN, RN, BSN [place]  [facility], Healthy Communities RN Case Manager for Aging Gracefully Direct Dial: [phone]                                                                Nolberto Batty, MSN, RN, BSN [place]  [facility], Healthy Communities RN Post- Acute Care Manager Direct Dial: [phone]

## 2023-05-06 ENCOUNTER — Other Ambulatory Visit: Payer: Self-pay | Admitting: Occupational Therapy

## 2023-05-10 NOTE — Patient Outreach (Signed)
 Aging Gracefully Program  OT Follow-Up Visit  05/10/2023--visit was on 05/06/2023  ATZIN SYLVIS 05-Jul-1950 [id number]  Visit:  2- Second Visit  Start Time:  1200 End Time:  1245 Total Minutes:  45  Readiness to Change Score :  Readiness to Change Score: 10  Durable Medical Equipment: Adaptive Equipment: Vallerie Gave, Theraputty, Other (electric hands free can opener) Adaptive Equipment Distribution Date: 05/06/23   Goals:   Goals Addressed               This Visit's Progress     COMPLETED: Patient Stated (pt-stated)        More strength and coordination in hands: hand exercises, stretches, AE for easier opening of cans and for clothing fastners MET  Name: Kataryna Kin           Date: 05/06/2023  OT Action Plan: Generic  Target Problem Area:  Decreased hand strength leading to decreased FM for ADLs and IADLs   Why Problem May Occur:     Arthritis in hands  Hands painful/stiff at times   Target Goal(s):  Increase hand strength and use AE to modify FM ADL and IADL tasks    STRATEGIES   Saving your Energy DO:  Use AE (button hook/zipper pull) for buttons and zippers on clothing so it makes it easier and less time consuming.  Use hands free electric can opener to open cans so hands do not get sore or painful from trying to use hand held can opener.    PRACTICE  Based on what we have talked about, you are willing to try:  To use the button hook/zipper pull over the next month  To and use the hands free can opener over the next month  Work on both hand theraputty exercises  If an idea does not work the first time, try it again.  We may make some changes over the next few sessions, based on how they work.    Fletcher Humble, OTR.L        05/06/2023 Occupational Therapist          Date        Post Clinical Reasoning: Client Action (Goal) Four Interventions: Pt was given a button hook/zipper pull, a hands free can opener, and red theraputty Did  Client Try?: Yes Targeted Problem Area Status: A Lot Better Clinician View Of Client Situation:: Ms. Marruffo reports she is doing well. Her OA continues to affect her hands and so the AE given to her today will help her with dressing and the can opener will help her in the kicthen---both without making her hand pain worse. The theraputty will help her build strength as well as keep her hands more limber. Client View Of His/Her Situation:: Ms. Parfait was really pleased with all her new items and is looking forward to the work getting started on her home. Next Visit Plan:: Await some of the work to be done on her home  [place], [place] Aging Gracefully [phone]

## 2023-05-19 ENCOUNTER — Encounter: Payer: Self-pay | Admitting: *Deleted

## 2023-05-19 ENCOUNTER — Encounter: Admitting: *Deleted

## 2023-05-19 ENCOUNTER — Other Ambulatory Visit: Payer: Self-pay | Admitting: *Deleted

## 2023-05-19 NOTE — Patient Instructions (Signed)
 Visit Information  Thank you for taking time to visit with me today. Please don't hesitate to contact me if I can be of assistance to you before our next scheduled home appointment.  Following are the goals we discussed today:   Goals Addressed               This Visit's Progress     AG RN (pt-stated)        03/16/23  Assessment: Heather Gibson recently started on Wegovy for weight loss. States she is looking forward to losing weight. Endorses eating twice a day and smaller portions. Endorses drinking sodas at times. Reports staying busy and enjoys helping others. 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 Heather Gibson states she feels comfortable with asking her PCP questions. States she plans to call PCP [facility]) to follow up on thyroid  ultrasound being scheduled and medication for gout. States PCP told her that her thyroid  was low and that they were going to schedule thyroid  ultrasound. Heather Gibson states she has not heard back from the PCP office yet. Heather Gibson reports receiving her medications from mail pharmacy. Endorses having trouble opening pill bottles.   Interventions:  Encouraged Heather Gibson to cut back on sodas. Encouraged Heather Gibson to write down questions she has for PCP to keep track. Provided a pill box. Provided chronic conditions book with log and calendar. Provided writer's contact information.   Plan: Scheduled next home visit for April 15th at 10 am.    CLIENT/RN ACTION PLAN - GENERIC - (smartphrase AGRNGENERIC)  Registered Nurse:  Nolberto Batty Date: 03/16/23  [name] Client ID:    Target Area:  Heather Gibson   Why Problem May Occur: Lack of energy, mindset, and motivation   Target Goal: Lose weight and become more active over the next 160 days.    STRATEGIES Coping Strategies: Ideas  Consume less food Take weight loss medication  Reviewed 03/16/23 Decrease salt intake  Reviewed 04/20/23 Decrease soda intake  Reviewed 05/19/23 Eat  smaller portions  Exercise Reviewed 03/16/23 Reviewed 04/20/23 Reviewed 05/19/23 Join Silver Sneakers exercise program,  Perform home exercises   Prevention Ideas                  PRACTICE It is important to practice the strategies so we can determine if they will be effective in helping to reach the goal.    Follow these specific recommendations:        If strategy does not work the first time, try it again.     We may make some changes over the next few sessions.      Nolberto Batty, MSN, RN, BSN [facility], Healthy Communities RN Case Manager for Aging Gracefully Direct Dial: [phone]    04/20/2023   Assessment: Heather Gibson reports [employer],will not cover [id number]. States she changed insurance from [place] to [facility] recently and [facility] does not cover the medication. States appeal was done and was still denied. Heather Gibson states she is disappointed but has continued to make dietary adjustments. States she was prescribed a new medication pill that aides in weight loss. She is hopeful. Reports she has been eating smaller portions, choosing healthier food options, and practicing encouraging self talk. States she has not gone to the gym yet for [employer]. Unfortunately, she has had several issues to come up that required her time and energy. States she plans to go to the gym or recreation center with a  friend. Reports thyroid  ultrasound is scheduled for May 22nd. Also states PCP started her on medication for gout. PCP appointment is scheduled for May 19th.   Interventions: Performed home exercises. Heather Gibson participated with return demonstration of home exercises. Exercise booklet provided. Discussed stress relief strategies such as deep breathing exercises. Encouraged Heather Gibson to engage with [employer] program or recreation center [employer].   Plan: Scheduled next home visit for May 14th at 10 am. Heather Gibson has writer's contact  information.   Nolberto Batty, MSN, RN, BSN [facility], Healthy Communities RN Case Manager for Aging Gracefully Direct Dial: [phone]    05/19/2023  Assessment: Heather Gibson reports walking regularly with her friend several times a week. Endorses doing home exercises. Reports eating smaller portions and is feeling great. Reports looking forward to her Family Reunion and her goddaughter's wedding in July. Reports having a wonderful Mother's Day weekend recently. States her grandson graduated from [employer] the weekend of Mother's Day as well. Provided summary of recent PCP visit. Thyroid  ultrasound scheduled for next week. Denies pain. States she is appreciative of the adaptive devices from [employer] OT. Reports arthritic hand pain and knees are feeling better. Reports a 11 pound weight loss. Reports PCP increased dosage for weight loss medication as well.   Interventions: Commended Heather Gibson for all of her great progress and strides she has been making. Encouraged Heather Gibson to have exercise back up plan when it rains. Encouraged Heather Gibson to keep up the great work.   Plan: Scheduled next home visit for Monday, June 23rd at 10 am.    Nolberto Batty, MSN, RN, BSN [place]  [facility], Healthy Communities RN Case Manager for Aging Gracefully Direct Dial: [phone]                                                                                              Our next appointment is on June 23rd  at 10 am  If you are experiencing a Mental Health or Behavioral Health Crisis or need someone to talk to, please call the Suicide and Crisis Lifeline: 988 call the [place]  National Suicide Prevention Lifeline: 516-148-8889 or TTY: 412-094-9797 TTY 585-601-6820) to talk to a trained counselor call 1-800-273-TALK (toll free, 24 hour hotline) go to [facility] [address], [place] [phone]) call 911   The patient verbalized understanding of instructions, educational materials, and care plan provided today and agreed to receive a mailed copy of patient instructions, educational materials, and care plan.   Nolberto Batty, MSN, RN, BSN [place]  [facility], Healthy Communities RN Case Manager for Aging Gracefully Direct Dial: [phone]

## 2023-05-19 NOTE — Patient Outreach (Signed)
 Aging [facility]fully Program  RN Visit  05/19/2023  Heather Gibson 1950-05-24 [id number]  Visit:  RN Visit Number: 3- Third Visit  RN TIME CALCULATION: Start TIme:  RN Start Time Calculation: 1000 End Time:  RN Stop Time Calculation: 1040 Total Minutes:  RN Time Calculation: 40  Readiness To Change Score:  Readiness to Change Score: 10  Universal RN Interventions: Calendar Distribution: No Exercise Review: Yes Medications: Yes Medication Changes: Yes Mood: Yes Pain: Yes PCP Advocacy/Support: No Fall Prevention: Yes Incontinence: Yes Clinician View Of Client Situation: Arrived for home visit. Mrs. Heather Gibson answered door in great spirits and bright smile. Home nicely decorated without clutter. Client View Of His/Her Situation: Mrs. Heather Gibson very pleased about losing 11 pounds. Reports she has been walking regularly. States she has not heard from [facility] yet.  Healthcare Provider Communication: Did Surveyor, mining With [employer] Provider?: No Healthcare Provider Response According to RN: N/A According to Client, Did PCP Report Communication With An Aging Gracefully RN?: No Healthcare Provider Response According To Client: N/A  Clinician View of Client Situation: Clinician View Of Client Situation: Arrived for home visit. Mrs. Heather Gibson answered door in great spirits and bright smile. Home nicely decorated without clutter. Client's View of His/Her Situation: Client View Of His/Her Situation: Mrs. Heather Gibson very pleased about losing 11 pounds. Reports she has been walking regularly. States she has not heard from [facility] yet.  Medication Assessment: Reviewed.    OT Update: Pending [facility] contracts and assessments.   Session Summary: Mrs. Heather Gibson is doing great overall. She is in great spirits. She pleased with her weight loss results. She states she is feeling great when she exercising. Reports a decreased in pain and increase in better mood.    Goals Addressed               This  Visit's Progress     AG RN (pt-stated)        03/16/23  Assessment: Mrs. Heather Gibson recently started on Wegovy for weight loss. States she is looking forward to losing weight. Endorses eating twice a day and smaller portions. Endorses drinking sodas at times. Reports staying busy and enjoys helping others. 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 Heather Gibson states she feels comfortable with asking her PCP questions. States she plans to call PCP [facility]) to follow up on thyroid  ultrasound being scheduled and medication for gout. States PCP told her that her thyroid  was low and that they were going to schedule thyroid  ultrasound. Mrs. Heather Gibson states she has not heard back from the PCP office yet. Mrs. Heather Gibson reports receiving her medications from mail pharmacy. Endorses having trouble opening pill bottles.   Interventions:  Encouraged Mrs. Heather Gibson to cut back on sodas. Encouraged Mrs. Heather Gibson to write down questions she has for PCP to keep track. Provided a pill box. Provided chronic conditions book with log and calendar. Provided writer's contact information.   Plan: Scheduled next home visit for April 15th at 10 am.    CLIENT/RN ACTION PLAN - GENERIC - (smartphrase AGRNGENERIC)  Registered Nurse:  Nolberto Batty Date: 03/16/23  [name] Client ID:    Target Area:  Heather Gibson   Why Problem May Occur: Lack of energy, mindset, and motivation   Target Goal: Lose weight and become more active over the next 160 days.    STRATEGIES Coping Strategies: Ideas  Consume less food Take weight loss medication  Reviewed 03/16/23 Decrease salt intake  Reviewed 04/20/23 Decrease soda intake  Reviewed 05/19/23 Eat smaller portions  Exercise Reviewed 03/16/23 Reviewed 04/20/23 Reviewed 05/19/23 Join Silver [employer]neakers exercise program,  Perform home exercises   Prevention Ideas                  PRACTICE It is important to practice the strategies so we can determine if they will be  effective in helping to reach the goal.    Follow these specific recommendations:        If strategy does not work the first time, try it again.     We may make some changes over the next few sessions.      Nolberto Batty, MSN, RN, BSN [facility], Healthy Communities RN Case Manager for Aging Gracefully Direct Dial: [phone]    04/20/2023   Assessment: Mrs. Heather Gibson reports UHC,will not cover [id number]. States she changed insurance from [employer] to [facility] recently and [facility] does not cover the medication. States appeal was done and was still denied. Mrs. Heather Gibson states she is disappointed but has continued to make dietary adjustments. States she was prescribed a new medication pill that aides in weight loss. She is hopeful. Reports she has been eating smaller portions, choosing healthier food options, and practicing encouraging self talk. States she has not gone to the gym yet for [employer]. Unfortunately, she has had several issues to come up that required her time and energy. States she plans to go to the gym or recreation center with a friend. Reports thyroid  ultrasound is scheduled for May 22nd. Also states PCP started her on medication for gout. PCP appointment is scheduled for May 19th.   Interventions: Performed home exercises. Mrs. Heather Gibson participated with return demonstration of home exercises. Exercise booklet provided. Discussed stress relief strategies such as deep breathing exercises. Encouraged Mrs. Heather Gibson to engage with [employer] program or recreation center [employer].   Plan: Scheduled next home visit for May 14th at 10 am. Mrs. Heather Gibson has writer's contact information.   Nolberto Batty, MSN, RN, BSN [facility], Healthy Communities RN Case Manager for Aging Gracefully Direct Dial: [phone]    05/19/2023  Assessment: Mrs. Heather Gibson reports walking regularly with her friend several times a week. Endorses  doing home exercises. Reports eating smaller portions and is feeling great. Reports looking forward to her Family Reunion and her goddaughter's wedding in July. Reports having a wonderful Mother's Day weekend recently. States her grandson graduated from [employer] the weekend of Mother's Day as well. Provided summary of recent PCP visit. Thyroid  ultrasound scheduled for next week. Denies pain. States she is appreciative of the adaptive devices from [employer] OT. Reports arthritic hand pain and knees are feeling better. Reports a 11 pound weight loss. Reports PCP increased dosage for weight loss medication as well.   Interventions: Commended Mrs. Hartnett for all of her great progress and strides she has been making. Encouraged Mrs. Los to have exercise back up plan when it rains. Encouraged Mrs.[place] to keep up the great work.   Plan: Scheduled next home visit for Monday, June 23rd at 10 am.    Nolberto Batty, MSN, RN, BSN   [facility], Healthy Communities RN Case Manager for Aging Gracefully Direct Dial: [phone]  Nolberto Batty, MSN, RN, BSN [place]  [facility], Healthy Communities RN Case Manager for Aging Gracefully Direct Dial: [phone]

## 2023-05-27 ENCOUNTER — Encounter: Payer: Self-pay | Admitting: "Endocrinology

## 2023-05-27 ENCOUNTER — Ambulatory Visit (INDEPENDENT_AMBULATORY_CARE_PROVIDER_SITE_OTHER): Admitting: "Endocrinology

## 2023-05-27 VITALS — BP 140/70 | HR 64 | Ht 64.5 in | Wt 217.0 lb

## 2023-05-27 DIAGNOSIS — R7989 Other specified abnormal findings of blood chemistry: Secondary | ICD-10-CM | POA: Diagnosis not present

## 2023-05-27 DIAGNOSIS — E559 Vitamin D deficiency, unspecified: Secondary | ICD-10-CM

## 2023-05-27 DIAGNOSIS — E213 Hyperparathyroidism, unspecified: Secondary | ICD-10-CM

## 2023-05-27 NOTE — Progress Notes (Signed)
 Outpatient Endocrinology Note Jorge Newcomer, MD  05/27/23   Heather Gibson [birth year] [id number]  Referring Provider: Ulysees Gander, MD Primary Care Provider: Terry Ficks, NP Subjective  No chief complaint on file.   Assessment & Plan  Diagnoses and all orders for this visit:  Low TSH level -     T4, free -     T3, free -     TSH -     TRAb (TSH Receptor Binding Antibody) -     Thyroid  stimulating immunoglobulin -     Vitamin D 1,25 dihydroxy -     Renal function panel -     PTH, intact and calcium  -     Magnesium -     VITAMIN D 25 Hydroxy (Vit-D Deficiency, Fractures)  Hyperparathyroidism (HCC) -     T4, free -     T3, free -     TSH -     TRAb (TSH Receptor Binding Antibody) -     Thyroid  stimulating immunoglobulin -     Vitamin D 1,25 dihydroxy -     Renal function panel -     PTH, intact and calcium  -     Magnesium -     VITAMIN D 25 Hydroxy (Vit-D Deficiency, Fractures)  Vitamin D deficiency -     T4, free -     T3, free -     TSH -     TRAb (TSH Receptor Binding Antibody) -     Thyroid  stimulating immunoglobulin -     Vitamin D 1,25 dihydroxy -     Renal function panel -     PTH, intact and calcium  -     Magnesium -     VITAMIN D 25 Hydroxy (Vit-D Deficiency, Fractures)    Heather Gibson is currently not taking any thyroid  medication. Patient was last biochemically euthyroid but TSH was low at 0.25 at one point in 2024?  Educated on thyroid  axis.  Recommend the following: labs today. Repeat lab before next visit or sooner if symptoms of hyperthyroidism or hypothyroidism develop.  Notify us  immediately in case of pregnancy/breastfeeding or significant weight gain or loss. Counseled on compliance and follow up needs.  Denies taking any Vit D/calcium   Records show 2024? high PTH to 89, low 25 Vit D at 28 with normal corrected calcium  at 9.6 with normal GFR at 93 05/20/15 DXA WNL   Ordered repeat labs   I have reviewed  current medications, nurse's notes, allergies, vital signs, past medical and surgical history, family medical history, and social history for this encounter. Counseled patient on symptoms, examination findings, lab findings, imaging results, treatment decisions and monitoring and prognosis. The patient understood the recommendations and agrees with the treatment plan. All questions regarding treatment plan were fully answered.   Return in about 5 weeks (around 07/01/2023) for visit, labs today.   Jorge Newcomer, MD  05/27/23   I have reviewed current medications, nurse's notes, allergies, vital signs, past medical and surgical history, family medical history, and social history for this encounter. Counseled patient on symptoms, examination findings, lab findings, imaging results, treatment decisions and monitoring and prognosis. The patient understood the recommendations and agrees with the treatment plan. All questions regarding treatment plan were fully answered.   History of Present Illness Heather Gibson is a 73 y.o. year old female who presents to our clinic with low TSH diagnosed in 2024?    Never been on  thyroid  medication.   Symptoms suggestive of HYPOTHYROIDISM:  fatigue Yes, sometimes  weight gain No cold intolerance  No constipation  Yes, some   Symptoms suggestive of HYPERTHYROIDISM:  weight loss  No heat intolerance No hyperdefecation  No palpitations  No  Compressive symptoms:  dysphagia  No dysphonia  Yes positional dyspnea (especially with simultaneous arms elevation)  No  Smokes  No On biotin  No Personal history of head/neck surgery/irradiation  No  Denies: kidney stones, abdominal pain, nausea/vomiting, hematuria On omeprazole  everyday  No kidney stones in family No history of MEN syndrome   05/20/15 DXA  The BMD measured at Femur Neck Left is 0.947 g/cm2 with a T-score of -0.7. This patient is considered normal according to World Health Organization  [facility]) criteria.   Site Region Measured Date Measured Age [age]A T-score BMD DualFemur Neck Left 05/20/2015 63.5 -0.8 0.947 g/cm2   AP Spine L1-L4 [birth year]/20/2015 63.5 1.6 1.385 g/cm2              Physical Exam  BP (!) 140/70   Pulse 64   Ht 5' 4.5" (1.638 m)   Wt 217 lb (98.4 kg)   SpO2 98%   BMI 36.67 kg/m  Constitutional: well developed, well nourished Head: normocephalic, atraumatic, no exophthalmos Eyes: sclera anicteric, no redness Neck: no thyromegaly, no thyroid  tenderness; no nodules palpated Lungs: normal respiratory effort Neurology: alert and oriented, no fine hand tremor Skin: dry, no appreciable rashes Musculoskeletal: no appreciable defects Psychiatric: normal mood and affect  Allergies Allergies  Allergen Reactions   Crestor  [Rosuvastatin ] Other (See Comments)    Myalgia    Egg-Derived Products     Years ago dx- eats egg now without any problems   Latex Swelling   Milk-Related Compounds     Cannot have large amt of milk products   Peanut-Containing Drug Products     Cannot have large amts of   Tramadol  Itching   Shellfish Allergy Itching    Current Medications Patient's Medications  New Prescriptions   No medications on file  Previous Medications   ACETAMINOPHEN (TYLENOL) 325 MG TABLET    Take 650 mg by mouth every 6 (six) hours as needed.   AMLODIPINE  (NORVASC ) 10 MG TABLET    TAKE 1 TABLET EVERY DAY. APPOINTMENT IS NEEDED   ASPIRIN EC 81 MG TABLET    Take 81 mg by mouth daily. Swallow whole.   ATORVASTATIN  (LIPITOR) 40 MG TABLET    TAKE 1 TABLET EVERY DAY   DICLOFENAC  SODIUM (VOLTAREN ) 1 % GEL    Apply 2 g topically 4 (four) times daily as needed.   FEBUXOSTAT (ULORIC) 40 MG TABLET    Take 40 mg by mouth daily.   LISINOPRIL -HYDROCHLOROTHIAZIDE  (ZESTORETIC ) 20-25 MG TABLET    TAKE 1 TABLET EVERY DAY. APPOINTMENT IS NEEDED   NAPROXEN SODIUM (ALEVE) 220 MG TABLET    Take 220 mg by mouth daily as needed.   NITROFURANTOIN,  MACROCRYSTAL-MONOHYDRATE, (MACROBID) 100 MG CAPSULE    Take 100 mg by mouth 2 (two) times daily. For 5 days   NYSTATIN POWDER    Apply 1 Application topically 2 (two) times daily.   OMEPRAZOLE  (PRILOSEC) 20 MG CAPSULE    TAKE 1 CAPSULE EVERY DAY   POTASSIUM CHLORIDE  (KLOR-CON  M) 10 MEQ TABLET    Take 1 tablet (10 mEq total) by mouth daily.   POTASSIUM CHLORIDE  (KLOR-CON ) 10 MEQ TABLET    Take 10 mEq by mouth daily. Takes 2 tabs as directed daily (total  of )   REPATHA  SURECLICK 140 MG/ML SOAJ    INJECT 140MG  UNDER THE SKIN EVERY 2 WEEKS   SEMAGLUTIDE-WEIGHT MANAGEMENT (WEGOVY) 0.5 MG/0.5ML SOAJ    Inject 0.5 mg into the skin once a week.   TOPIRAMATE (TOPAMAX) 25 MG TABLET    Take 50 mg by mouth at bedtime.   TRAZODONE  (DESYREL ) 100 MG TABLET    TAKE 1 TABLET(100 MG) BY MOUTH AT BEDTIME  Modified Medications   No medications on file  Discontinued Medications   No medications on file    Past Medical History Past Medical History:  Diagnosis Date   Allergy    Arthritis    Constipation    GERD (gastroesophageal reflux disease)    Hyperlipidemia    Hypertension    Sleep apnea     Past Surgical History Past Surgical History:  Procedure Laterality Date   ABDOMINAL HYSTERECTOMY     COLONOSCOPY  2017   COLPORRHAPHY     PROLAPSED UTERINE FIBROID LIGATION     x2   SALPINGOOPHORECTOMY Right     Family History family history includes Breast cancer in her maternal grandmother and mother; Diabetes in her maternal grandmother; Hypertension in her maternal grandmother and mother; Schizophrenia in her mother.  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   Married for 14 years   Three Children ( all live locally)    Social Drivers of Corporate investment banker Strain: Low Risk  (11/03/2021)   Overall Financial Resource Strain (CARDIA)    Difficulty of Paying Living Expenses: Not hard at all  Food Insecurity: No Food Insecurity (11/03/2021)   Hunger Vital Sign    Worried About Running Out of Food in the Last Year: Never true    Ran Out of Food in the Last Year: Never true  Transportation Needs: No Transportation Needs (11/03/2021)   PRAPARE - Administrator, Civil Service (Medical): No    Lack of Transportation (Non-Medical): No  Physical Activity: Inactive (11/03/2021)   Exercise Vital Sign    Days of Exercise per Week: 0 days    Minutes of Exercise per Session: 0 min  Stress: No Stress Concern Present (11/03/2021)   [employer] of Occupational Health - Occupational Stress Questionnaire    Feeling of Stress : Not at all  Social Connections: Moderately Isolated (11/01/2020)   Social Connection and Isolation Panel [NHANES]    Frequency of Communication with Friends and Family: Twice a week    Frequency of Social Gatherings with Friends and Family: Twice a week    Attends Religious Services: More than 4 times per year    Active Member of [employer] or Organizations: No    Attends Banker Meetings: Never    Marital Status: Separated  Intimate Programme researcher, broadcasting/film/video  Violence: Not At Risk (11/01/2020)   Humiliation, Afraid, Rape, and Kick questionnaire    Fear of Current or Ex-Partner: No    Emotionally Abused: No    Physically Abused: No    Sexually Abused: No    Laboratory Investigations Lab Results  Component Value Date   TSH 0.55 04/16/2022   TSH 0.52  05/07/2020   TSH 0.29 (L) 04/12/2020   FREET4 0.63 05/07/2020     No results found for: "TSI"   No components found for: "TRAB"   Lab Results  Component Value Date   CHOL 120 04/16/2022   Lab Results  Component Value Date   HDL 65.20 04/16/2022   Lab Results  Component Value Date   LDLCALC 41 04/16/2022   Lab Results  Component Value Date   TRIG 70.0 04/16/2022   Lab Results  Component Value Date   CHOLHDL 2 04/16/2022   Lab Results  Component Value Date   CREATININE 0.83 04/16/2022   Lab Results  Component Value Date   GFR 71.41 04/16/2022      Component Value Date/Time   NA 139 04/16/2022 0937   K 3.3 (L) 04/16/2022 0937   CL 99 04/16/2022 0937   CO2 30 04/16/2022 0937   GLUCOSE 93 04/16/2022 0937   BUN 16 04/16/2022 0937   CREATININE 0.83 04/16/2022 0937   CALCIUM  9.8 04/16/2022 0937   PROT 7.8 04/16/2022 0937   ALBUMIN 4.5 04/16/2022 0937   AST 15 04/16/2022 0937   ALT 13 04/16/2022 0937   ALKPHOS 81 04/16/2022 0937   BILITOT 0.5 04/16/2022 0937   GFRNONAA >60 02/17/2008 1056   GFRAA  02/17/2008 1056    >60        The eGFR has been calculated using the MDRD equation. This calculation has not been validated in all clinical situations. eGFR's persistently <60 mL/min signify possible Chronic Kidney Disease.      Latest Ref Rng & Units 04/16/2022    9:37 AM 04/12/2020    8:55 AM 03/15/2019    9:27 AM  BMP  Glucose 70 - 99 mg/dL 93  98  94   BUN 6 - 23 mg/dL 16  17  14    Creatinine 0.40 - 1.20 mg/dL 8.46  9.62  9.52   Sodium 135 - 145 mEq/L 139  138  139   Potassium 3.5 - 5.1 mEq/L 3.3  3.4  3.7   Chloride 96 - 112 mEq/L 99  100  101   CO2 19 - 32 mEq/L 30  29  31    Calcium  8.4 - 10.5 mg/dL 9.8  9.8  9.7        Component Value Date/Time   WBC 13.4 (H) 04/16/2022 0937   RBC 4.70 04/16/2022 0937   HGB 14.1 04/16/2022 0937   HCT 42.6 04/16/2022 0937   PLT 437.0 (H) 04/16/2022 0937   MCV 90.8 04/16/2022 0937   MCHC 33.1 04/16/2022 0937    RDW 14.0 04/16/2022 0937   LYMPHSABS 2.6 04/16/2022 0937   MONOABS 0.8 04/16/2022 0937   EOSABS 0.1 04/16/2022 0937   BASOSABS 0.1 04/16/2022 0937      Parts of this note may have been dictated using voice recognition software. There may be variances in spelling and vocabulary which are unintentional. Not all errors are proofread. Please notify the [facility] if any discrepancies are noted or if the meaning of any statement is not clear.

## 2023-06-03 ENCOUNTER — Ambulatory Visit: Payer: Self-pay | Admitting: "Endocrinology

## 2023-06-28 ENCOUNTER — Encounter: Payer: Self-pay | Admitting: *Deleted

## 2023-06-28 ENCOUNTER — Other Ambulatory Visit: Payer: Self-pay | Admitting: *Deleted

## 2023-06-28 NOTE — Patient Outreach (Signed)
 Aging Gracefully Program  RN Visit  06/28/2023  Heather Gibson August 14, 1950 [id number]  Visit:  RN Visit Number: 4- Fourth Visit  RN TIME CALCULATION: Start TIme:  RN Start Time Calculation: 1030 End Time:  RN Stop Time Calculation: 1130 Total Minutes:  RN Time Calculation: 60  Readiness To Change Score:  Readiness to Change Score: 10  Universal RN Interventions: Calendar Distribution: No Exercise Review: Yes Medications: Yes Medication Changes: No Mood: Yes Pain: Yes PCP Advocacy/Support: No Fall Prevention: Yes Incontinence: Yes Clinician View Of Client Situation: Arrived for home viist. Heather Gibson cheerfully answered door. Heather Gibson in great spirits. Ambulates independently without assistive device. Client View Of His/Her Situation: Heather Gibson reports being thrilled about the home modifications done by [facility] thus far. Reports being very pleased with the AG program.  Healthcare Provider Communication: Did Surveyor, mining With [employer] Provider?: No Healthcare Provider Response According to RN: N/A According to Client, Did PCP Report Communication With An Aging Gracefully RN?: No Healthcare Provider Response According To Client: N/A  Clinician View of Client Situation: Clinician View Of Client Situation: Arrived for home viist. Heather Gibson cheerfully answered door. Heather Gibson in great spirits. Ambulates independently without assistive device. Client's View of His/Her Situation: Client View Of His/Her Situation: Heather Gibson reports being thrilled about the home modifications done by [facility] thus far. Reports being very pleased with the AG program.  Medication Assessment: Reviewed    OT Update: Pending CHS completion.  Session Summary: Heather Gibson has been delightful to work with over the past few months. She is doing well and thriving over all. 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    Goals Addressed                This Visit's Progress     COMPLETED: AG RN (pt-stated)        03/16/23  Assessment: Heather Gibson recently started on Wegovy for weight loss. States she is looking forward to losing weight. Endorses eating twice a day and smaller portions. Endorses drinking sodas at times. Reports staying busy and enjoys helping others. 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 Heather Gibson states she feels comfortable with asking her PCP questions. States she plans to call PCP [facility]) to follow up on thyroid  ultrasound being scheduled and medication for gout. States PCP told her that her thyroid  was low and that they were going to schedule thyroid  ultrasound. Heather Gibson states she has not heard back from the PCP office yet. Heather Gibson reports receiving her medications from mail pharmacy. Endorses having trouble opening pill bottles.   Interventions:  Encouraged Heather Gibson to cut back on sodas. Encouraged Heather Gibson to write down questions she has for PCP to keep track. Provided a pill box. Provided chronic conditions book with log and calendar. Provided writer's contact information.   Plan: Scheduled next home visit for April 15th at 10 am.    CLIENT/RN ACTION PLAN - GENERIC - (smartphrase AGRNGENERIC)  Registered Nurse:  Pablo Hurst Date: 03/16/23  [name] Client ID:    Target Area:  SOFIA   Why Problem May Occur: Lack of energy, mindset, and motivation   Target Goal: Lose weight and become more active over the next 160 days.    STRATEGIES Coping Strategies: Ideas  Consume less food Take weight loss medication  Reviewed 03/16/23 Decrease salt intake  Reviewed 04/20/23 Decrease soda intake  Reviewed 05/19/23 Reviewed 06/28/23 Eat smaller  portions  Exercise Reviewed 03/16/23 Reviewed 04/20/23 Reviewed 05/19/23 Reviewed 06/28/23 Join [employer] exercise program,  Perform home exercises   Prevention Ideas                  PRACTICE It is important to  practice the strategies so we can determine if they will be effective in helping to reach the goal.    Follow these specific recommendations:        If strategy does not work the first time, try it again.     We may make some changes over the next few sessions.      Pablo Hurst, MSN, RN, BSN [facility], Healthy Communities RN Case Manager for Aging Gracefully Direct Dial: [phone]    04/20/2023   Assessment: Heather Gibson reports [employer],will not cover [id number]. States she changed insurance from [place] to [facility] recently and [facility] does not cover the medication. States appeal was done and was still denied. Heather Gibson states she is disappointed but has continued to make dietary adjustments. States she was prescribed a new medication pill that aides in weight loss. She is hopeful. Reports she has been eating smaller portions, choosing healthier food options, and practicing encouraging self talk. States she has not gone to the gym yet for [employer]. Unfortunately, she has had several issues to come up that required her time and energy. States she plans to go to the gym or recreation center with a friend. Reports thyroid  ultrasound is scheduled for May 22nd. Also states PCP started her on medication for gout. PCP appointment is scheduled for May 19th.   Interventions: Performed home exercises. Heather Gibson participated with return demonstration of home exercises. Exercise booklet provided. Discussed stress relief strategies such as deep breathing exercises. Encouraged Heather Gibson to engage with [employer] program or recreation center [employer].   Plan: Scheduled next home visit for May 14th at 10 am. Heather Gibson has writer's contact information.   Pablo Hurst, MSN, RN, BSN [facility], Healthy Communities RN Case Manager for Aging Gracefully Direct Dial: [phone]    05/19/2023  Assessment: Heather Gibson reports walking  regularly with her friend several times a week. Endorses doing home exercises. Reports eating smaller portions and is feeling great. Reports looking forward to her Family Reunion and her goddaughter's wedding in July. Reports having a wonderful Mother's Day weekend recently. States her grandson graduated from [employer] the weekend of Mother's Day as well. Provided summary of recent PCP visit. Thyroid  ultrasound scheduled for next week. Denies pain. States she is appreciative of the adaptive devices from [employer] OT. Reports arthritic hand pain and knees are feeling better. Reports a 11 pound weight loss. Reports PCP increased dosage for weight loss medication as well.   Interventions: Commended Mrs. Crego for all of her great progress and strides she has been making. Encouraged Mrs. Prestage to have exercise back up plan when it rains. Encouraged HeatherRolfe to keep up the great work.   Plan: Scheduled next home visit for Monday, June 23rd at 10 am.    Pablo Hurst, MSN, RN, BSN [place]  [facility], Healthy Communities RN Case Manager for Aging Gracefully Direct Dial: [phone]     06/28/23  Assessment: Mrs. Frisinger is doing very well overall. Reports weight being 209 lbs, which is down from 223. She has lost a total of 14 pounds over the past few months. States she continues to practice portion control,  exercise, and walking. States she is cutting back on how much soda she drinks. States she is looking forward to the upcoming family reunion and her god daughter's wedding in July. Reports feeling delighted with home modifications performed by [employer] thus far. States she has been staying active with walking with her best friend 1-2 times a week at the park, going to church, and spending time with family. Denies pain or falls. Plans to join [employer] program in the Fall. Reports Endocrinologist did not think she needed a thyroid  ultrasound. States her  thyroid  function is normal per Endocrinologist.  Intervention: Complimented Mrs. Gitlin on staying active and staying consistent with her weight loss efforts. Encouraged Mrs. Charters to stay hydrated and decrease outdoor time during the high temperature days. Encouraged Mrs. Grant to do indoor exercises when it is hot outside. Encouraged Mrs. Burkholder to take rest breaks throughout the day to conserve energy. Praised Mrs. Turek for being so diligent with maintaining health and wellness. Discussed this is last AG RN home visit. Discussed OT will visit after CHS completion.   Plan: Will notify AG team of writer's last home visit completed.   Pablo Hurst, MSN, RN, BSN [place]  [facility], Healthy Communities RN Case Manager for Aging Gracefully Direct Dial: [phone]

## 2023-06-28 NOTE — Patient Instructions (Signed)
 Visit Information  Thank you for taking time to visit with me today. Please don't hesitate to contact me if I can be of assistance to you before our next scheduled home appointment.  Following are the goals we discussed today:  ,  Goals Addressed               This Visit's Progress     COMPLETED: AG RN (pt-stated)        03/16/23  Assessment: Heather Gibson recently started on Wegovy for weight loss. States she is looking forward to losing weight. Endorses eating twice a day and smaller portions. Endorses drinking sodas at times. Reports staying busy and enjoys helping others. 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 Heather Gibson states she feels comfortable with asking her PCP questions. States she plans to call PCP [facility]) to follow up on thyroid  ultrasound being scheduled and medication for gout. States PCP told her that her thyroid  was low and that they were going to schedule thyroid  ultrasound. Heather Gibson states she has not heard back from the PCP office yet. Heather Gibson reports receiving her medications from mail pharmacy. Endorses having trouble opening pill bottles.   Interventions:  Encouraged Heather Gibson to cut back on sodas. Encouraged Heather Gibson to write down questions she has for PCP to keep track. Provided a pill box. Provided chronic conditions book with log and calendar. Provided writer's contact information.   Plan: Scheduled next home visit for April 15th at 10 am.    CLIENT/RN ACTION PLAN - GENERIC - (smartphrase AGRNGENERIC)  Registered Nurse:  Pablo Hurst Date: 03/16/23  [name] Client ID:    Target Area:  SOFIA   Why Problem May Occur: Lack of energy, mindset, and motivation   Target Goal: Lose weight and become more active over the next 160 days.    STRATEGIES Coping Strategies: Ideas  Consume less food Take weight loss medication  Reviewed 03/16/23 Decrease salt intake  Reviewed 04/20/23 Decrease soda intake  Reviewed  05/19/23 Reviewed 06/28/23 Eat smaller portions  Exercise Reviewed 03/16/23 Reviewed 04/20/23 Reviewed 05/19/23 Reviewed 06/28/23 Join [employer] exercise program,  Perform home exercises   Prevention Ideas                  PRACTICE It is important to practice the strategies so we can determine if they will be effective in helping to reach the goal.    Follow these specific recommendations:        If strategy does not work the first time, try it again.     We may make some changes over the next few sessions.      Pablo Hurst, MSN, RN, BSN [facility], Healthy Communities RN Case Manager for Aging Gracefully Direct Dial: [phone]    04/20/2023   Assessment: Heather Gibson reports [employer],will not cover [id number]. States she changed insurance from [place] to [facility] recently and [facility] does not cover the medication. States appeal was done and was still denied. Heather Gibson states she is disappointed but has continued to make dietary adjustments. States she was prescribed a new medication pill that aides in weight loss. She is hopeful. Reports she has been eating smaller portions, choosing healthier food options, and practicing encouraging self talk. States she has not gone to the gym yet for [employer]. Unfortunately, she has had several issues to come up that required her time and energy. States she plans to go to the  gym or recreation center with a friend. Reports thyroid  ultrasound is scheduled for May 22nd. Also states PCP started her on medication for gout. PCP appointment is scheduled for May 19th.   Interventions: Performed home exercises. Heather Gibson participated with return demonstration of home exercises. Exercise booklet provided. Discussed stress relief strategies such as deep breathing exercises. Encouraged Heather Gibson to engage with [employer] program or recreation center [employer].   Plan: Scheduled next home visit for May 14th at 10  am. Mrs. West has writer's contact information.   Pablo Hurst, MSN, RN, BSN [facility], Healthy Communities RN Case Manager for Aging Gracefully Direct Dial: [phone]    05/19/2023  Assessment: Heather Gibson reports walking regularly with her friend several times a week. Endorses doing home exercises. Reports eating smaller portions and is feeling great. Reports looking forward to her Family Reunion and her goddaughter's wedding in July. Reports having a wonderful Mother's Day weekend recently. States her grandson graduated from [employer] the weekend of Mother's Day as well. Provided summary of recent PCP visit. Thyroid  ultrasound scheduled for next week. Denies pain. States she is appreciative of the adaptive devices from [employer] OT. Reports arthritic hand pain and knees are feeling better. Reports a 11 pound weight loss. Reports PCP increased dosage for weight loss medication as well.   Interventions: Commended Heather Gibson for all of her great progress and strides she has been making. Encouraged Heather Gibson to have exercise back up plan when it rains. Encouraged HeatherGibson to keep up the great work.   Plan: Scheduled next home visit for Monday, June 23rd at 10 am.    Pablo Hurst, MSN, RN, BSN [place]  [facility], Healthy Communities RN Case Manager for Aging Gracefully Direct Dial: [phone]     06/28/23  Assessment: Heather Gibson is doing very well overall. Reports weight being 209 lbs, which is down from 223. She has lost a total of 14 pounds over the past few months. States she continues to practice portion control, exercise, and walking. States she is cutting back on how much soda she drinks. States she is looking forward to the upcoming family reunion and her god daughter's wedding in July. Reports feeling delighted with home modifications performed by [employer] thus far. States she has been staying active  with walking with her best friend 1-2 times a week at the park, going to church, and spending time with family. Denies pain or falls. Plans to join [employer] program in the Fall. Reports Endocrinologist did not think she needed a thyroid  ultrasound. States her thyroid  function is normal per Endocrinologist.  Intervention: Complimented Heather Gibson on staying active and staying consistent with her weight loss efforts. Encouraged Heather Gibson to stay hydrated and decrease outdoor time during the high temperature days. Encouraged Heather Gibson to do indoor exercises when it is hot outside. Encouraged Heather Gibson to take rest breaks throughout the day to conserve energy. Praised Mrs. Prange for being so diligent with maintaining health and wellness. Discussed this is last AG RN home visit. Discussed OT will visit after CHS completion.   Plan: Will notify AG team of writer's last home visit completed.   Pablo Hurst, MSN, RN, BSN [place]  [facility], Healthy Communities RN Case Manager for Aging Gracefully Direct Dial: [phone]  If you are experiencing a Mental Health or Behavioral Health Crisis or need someone to talk to, please call the Suicide and Crisis Lifeline: 988 call the [place]  National Suicide Prevention Lifeline: (859)241-7561 or TTY: 509-457-6894 TTY 330-588-0278) to talk to a trained counselor call 1-800-273-TALK (toll free, 24 hour hotline) go to [facility] [address] [place] [phone]) call 911   The patient verbalized understanding of instructions, educational materials, and care plan provided today and agreed to receive a mailed copy of patient instructions, educational materials, and care plan.  Pablo Hurst, MSN, RN, BSN [place]  [facility], Healthy Communities RN Case Manager for Aging Gracefully Direct Dial: [phone]

## 2023-07-14 ENCOUNTER — Ambulatory Visit: Admitting: "Endocrinology

## 2023-08-07 ENCOUNTER — Other Ambulatory Visit: Admitting: Occupational Therapy

## 2023-08-07 NOTE — Patient Outreach (Signed)
 Aging Gracefully Program  OT Follow-Up Visit  08/07/2023  IVELIZ GARAY Jul 04, 1950 [id number]  Visit:  3- Third Visit  Start Time:  0800 End Time:  0838 Total Minutes:  38  Readiness to Change Score :  Readiness to Change Score: 10   Durable Medical Equipment: Durable Medical Equipment: Shower Chair With Back Durable Medical Equipment Distribution Date: 08/07/23   Goals:   Goals Addressed               This Visit's Progress     COMPLETED: Patient Stated (pt-stated)        To feel more safe in bathroom: MASTER: comfort height toilet with grab bar in front of toilet. HALL: Tub to shower conversion with grab bars, hand held shower with lower shower holder, shower seat. Comfort height toilet with possible raise and lower grab bar on the right of the toilet as you are sitting on it.  Name: Kaydin Labo   Date: 08/07/2023   OT ACTION PLAN: Bathing   Target Problem Area:   Safety in the bathroom  Why Problem May Occur:    Decreased balance  Decreased movement  Pain   Target Goal(s):   Safety with showering and toileting    STRATEGIES   Saving Your Energy DO:  Use a tub seat   Keep frequently used items within easy reach  While seated use handheld shower to get wet and rinse off as much as possible while seated       Modifying your home environment and making it safe DO:  Install grab bars in the shower and next to the toilet  Place a rubber mat in front of seat or non skid treds  Make sure the bathroom is well-lit    Simplifying the way you set up tasks or daily routines DO:  Plan to bathe/shower before you're overly tired  Gather everything you will need before beginning    PRACTICE   Based on what we have talked about, you are willing to keep trying:   To use all the modifications in your knew remodeled bathroom.     If an idea does not work the first time, try it again (and again). We may make some changes the next session, based on how they  work.      Donny Ditch, OTR/L        08/07/2023  Occupational Therapist                        Date        Post Clinical Reasoning: Client Action (Goal) One Interventions: Thrilled with bathrooms Did Client Try?: Yes Targeted Problem Area Status: A Lot Better Clinician View Of Client Situation:: Ms. Ventress is doing well and her home modifications have definitely made her safer in her home Client View Of His/Her Situation:: Ms, Needles is so happy with all of her home modifications--she feels so much safer in her shower, easier to get up on her toilets, does not have to worry about tripping over thresholds. Next Visit Plan:: Looking into a rollator

## 2023-08-07 NOTE — Patient Instructions (Signed)
 Name: [name]   Date: 08/07/2023   OT ACTION PLAN: Bathing   Target Problem Area:   Safety in the bathroom  Why Problem May Occur:    Decreased balance  Decreased movement  Pain   Target Goal(s):   Safety with showering and toileting    STRATEGIES   Saving Your Energy DO:  Use a tub seat   Keep frequently used items within easy reach  While seated use handheld shower to get wet and rinse off as much as possible while seated       Modifying your home environment and making it safe DO:  Install grab bars in the shower and next to the toilet  Place a rubber mat in front of seat or non skid treds  Make sure the bathroom is well-lit    Simplifying the way you set up tasks or daily routines DO:  Plan to bathe/shower before you're overly tired  Gather everything you will need before beginning    PRACTICE   Based on what we have talked about, you are willing to keep trying:   To use all the modifications in your knew remodeled bathroom.     If an idea does not work the first time, try it again (and again). We may make some changes the next session, based on how they work.      Donny Ditch, OTR/L        08/07/2023  Occupational Therapist                        Date

## 2023-08-10 ENCOUNTER — Other Ambulatory Visit: Payer: Self-pay | Admitting: Student

## 2023-08-10 DIAGNOSIS — Z1231 Encounter for screening mammogram for malignant neoplasm of breast: Secondary | ICD-10-CM

## 2023-08-17 ENCOUNTER — Encounter: Payer: Self-pay | Admitting: Internal Medicine

## 2023-09-02 ENCOUNTER — Ambulatory Visit

## 2023-09-14 ENCOUNTER — Ambulatory Visit (HOSPITAL_BASED_OUTPATIENT_CLINIC_OR_DEPARTMENT_OTHER): Admitting: Nurse Practitioner

## 2023-09-14 ENCOUNTER — Encounter (HOSPITAL_BASED_OUTPATIENT_CLINIC_OR_DEPARTMENT_OTHER): Payer: Self-pay | Admitting: Nurse Practitioner

## 2023-09-14 ENCOUNTER — Encounter (HOSPITAL_BASED_OUTPATIENT_CLINIC_OR_DEPARTMENT_OTHER): Payer: Self-pay | Admitting: *Deleted

## 2023-09-14 VITALS — BP 144/76 | HR 64 | Ht 66.0 in | Wt 214.9 lb

## 2023-09-14 DIAGNOSIS — E782 Mixed hyperlipidemia: Secondary | ICD-10-CM | POA: Diagnosis not present

## 2023-09-14 DIAGNOSIS — I700 Atherosclerosis of aorta: Secondary | ICD-10-CM | POA: Diagnosis not present

## 2023-09-14 DIAGNOSIS — I10 Essential (primary) hypertension: Secondary | ICD-10-CM

## 2023-09-14 DIAGNOSIS — E785 Hyperlipidemia, unspecified: Secondary | ICD-10-CM

## 2023-09-14 DIAGNOSIS — R931 Abnormal findings on diagnostic imaging of heart and coronary circulation: Secondary | ICD-10-CM

## 2023-09-14 NOTE — Patient Instructions (Signed)
 Medication Instructions:  Your physician recommends that you continue on your current medications as directed. Please refer to the Current Medication list given to you today.   Labwork: LIPID PANEL  Testing/Procedures:  NONE ORDERED.  Follow-Up: Your physician wants you to follow-up in: 1 YEAR.  You will receive a reminder letter in the mail two months in advance. If you don't receive a letter, please call our office to schedule the follow-up appointment.   Any Other Special Instructions Will Be Listed Below (If Applicable).     If you need a refill on your cardiac medications before your next appointment, please call your pharmacy.

## 2023-09-14 NOTE — Progress Notes (Signed)
 Cardiology Office Note:  .   Date:  09/14/2023  ID:  Heather Gibson, DOB 1950-11-20, MRN [id number] PCP: Heather Harlene CROME, NP  [facility] Providers Cardiologist:  None    Patient Profile: .      PMH Mixed dyslipidemia/Suspect familial hypercholesterolemia Myalgia with rosuvastatin  Hypertension Coronary calcium  score 229 (89th percentile) Aortic atherosclerosis, mitral annular calcification, and pulmonary artery enlargement noted on CT  [facility] and seen by Dr. Mona on 10/20/2019.  Longstanding history of dyslipidemia with LDL cholesterol over 200 at times.  Myalgias with rosuvastatin .  Overall lives a sedentary lifestyle and eats a Southern diet with lots of fried foods. Does like vegetables.  She was started on atorvastatin  which she has tolerated. Repatha  was added with significant reduction in LDL. Aspirin was added due to elevated CAC and aortic atherosclerosis.   Seen by me on 08/12/2022 for annual follow-up of dyslipidemia. Reported she was feeling well with occasional episodes where she feels her heart racing.  Admits she is pretty sedentary. Frequently does housework and takes her dogs outside in the yard. She cannot walk in her neighborhood because of big dogs. Has [employer] and has considered participating in [employer].  Does not feel like she can walk a long distance right now due to plantar fasciitis. Is seeing a foot specialist. BP has been well controlled, usually 130/60-70 mmHG. We reviewed most recent lipid results that I have on file, but she reports more recent lab work from [facility]. I have asked her to have it faxed. She admits, I know what to do for myself, I just need to make myself do it in regards to healthy diet and regular exercise.   From [facility] 04/16/22: TC 120, LDL 41, triglycerides 70, HDL 62.2. Lipids were well controlled and atorvastatin  and Repatha  were continued.        History of Present Illness: .    Discussed the use of AI scribe software for clinical note transcription with the patient, who gave verbal consent to proceed.  History of Present Illness Heather Gibson is a very pleasant 73 year old female who presents for follow-up of hyperlipidemia. She reports she is feeling well. Has some recent family stress which is on her mind but no physical complaints. Has sensation of dizziness when getting out of bed quickly on occasion. She uses a cane for stability. She is compliant with Repatha  and atorvastatin  and reports no problems or no concerning side effects. She is not taking Wegovy due to insurance issues. She has been walking for exercise which has helped her lose weight. She has lost 16 pounds since last year. She has also been incorporating healthier diet options. Recent lab work  with PCP did not include cholesterol. She maintains an active lifestyle by walking and performing household tasks. She has a supportive social network, including a best friend with whom she walks regularly. She denies chest pain, shortness of breath, orthopnea, PND, edema, presyncope or syncope.   ROS: See HPI       Studies Reviewed: Heather Gibson   EKG Interpretation Date/Time:  Tuesday September 14 2023 10:41:25 EDT Ventricular Rate:  64 PR Interval:  166 QRS Duration:  90 QT Interval:  448 QTC Calculation: 462 R Axis:   -36  Text Interpretation: Normal sinus rhythm with sinus arrhythmia Left axis deviation Moderate voltage criteria for LVH, may be normal variant ( R in aVL , Cornell product ) Nonspecific T wave abnormality When compared  with ECG of 08-Feb-2005 14:25, No significant change was found Confirmed by Heather Gibson [phone]) on 09/14/2023 10:51:10 AM    Risk Assessment/Calculations:     HYPERTENSION CONTROL Vitals:   09/14/23 1046 09/14/23 1101  BP: (!) 150/76 (!) 144/76    The patient's blood pressure is elevated above target today.  In order to address the patient's elevated BP: Blood pressure  will be monitored at home to determine if medication changes need to be made.          Physical Exam:   VS:  BP (!) 144/76   Pulse 64   Ht 5' 6 (1.676 m)   Wt 214 lb 14.4 oz (97.5 kg)   SpO2 95%   BMI 34.69 kg/m    Wt Readings from Last 3 Encounters:  09/14/23 214 lb 14.4 oz (97.5 kg)  05/27/23 217 lb (98.4 kg)  08/12/22 230 lb (104.3 kg)    GEN: Obese, well developed in no acute distress NECK: No JVD; No carotid bruits CARDIAC: RRR, no murmurs, rubs, gallops RESPIRATORY:  Clear to auscultation without rales, wheezing or rhonchi  ABDOMEN: Soft, non-tender, non-distended EXTREMITIES:  No edema; No deformity     ASSESSMENT AND PLAN: .    Mixed dyslipidemia LDL goal < 70: Most recent lipid panel on file from April 2024 reveals LDL 41, triglycerides 70, HDL 65.2, very well controlled. No repeat lipid testing since that time. She is tolerating atorvastatin  and Repatha  without adverse side effects. Is overall feeling very well and has achieved weight loss with healthier diet and increased exercise. We will get repeat lipid panel when she can return fasting. We will continue Repatha  and atorvastatin . Plan for 1 year follow-up. Continue heart healthy diet and regular exercise for goal of at least 150 minutes of moderate intensity exercise each week.   Coronary artery disease/aortic atherosclerosis: CT calcium  score 09/11/2020 with CAC score 229 (89th percentile), aortic atherosclerosis. She has recently increased exercise and denies chest pain, dyspnea, or other symptoms concerning for angina.  No indication for further ischemic evaluation at this time.  Secondary prevention emphasized including 150 minutes of moderate intensity exercise each week along with heart healthy diet low in saturated fat and cholesterol, sugar, processed foods, and simple carbohydrates. No bleeding concerns on aspirin. Continue aspirin, atorvastatin , Zestoretic , Repatha .  Hypertension: BP is mildly elevated today. She  reports home BP typically < 140/80. No change in anti-hypertensive therapy today. Continue amlodipine , Zestoretic . Management per PCP.        Dispo: 1 year with Dr. Mona or me in Lipid clinic  Signed, Gibson Percy, NP-C

## 2023-09-29 ENCOUNTER — Other Ambulatory Visit: Payer: Self-pay | Admitting: Occupational Therapy

## 2023-09-29 NOTE — Patient Outreach (Signed)
 Aging Gracefully Program  OT FINAL Visit  09/29/2023  Heather Gibson 1950/06/15 [id number]  Visit:  4- Fourth Visit  Start Time:  1630 End Time:  1705 Total Minutes:  35  Readiness to Change:  Readiness to Change Score: 10  Durable Medical Equipment: Durable Medical Equipment:  (rollator) Durable Medical Equipment Distribution Date: 09/29/23  Patient Education: Education Provided: Yes Education Details: Tips for Aging at [employer]) Educated: Patient Comprehension: Verbalized Understanding  Goals:  Goals Addressed               This Visit's Progress     COMPLETED: Patient Stated (pt-stated)        Safety around the house: rollator and leveling out areas of house where there are high threshold that rollator or a wheelchair cannot easily go over. MET for both rollator and thresholds   Name: Heather Gibson   Date: 09/29/2023   OT ACTION PLAN: Functional Mobility   Target Problem Area:   Safety around in the house and in and out of the house    Why Problem May Occur:    Decreased balance  Pain  Target Goal(s):   Safety around the house and in/out of the house      STRATEGIES    Saving Your Energy DO:  Use the rollator to sit on when you get tired walking around or standing     Modifying your home environment and making it safe DO:  Threshold "ramps" in and out of house where surfaces are uneven    Simplifying the way you set up tasks or daily routines DO:  Plan for what you need to do the next day   PRACTICE  Based on what we have talked about, you are willing to try: the rollator when you need to be up on your feet for long periods of time.   Heather Gibson, OTR/L         09/29/2023  Occupational Therapist                        Date      COMPLETED: Patient Stated (pt-stated)        Safety in and out of the house: leveling out thresholds at front and back door so rollator and wheelchair can get easily over. MET-Thresholds completed         Post Clinical Reasoning: Clinician View Of Client Situation:: [name] continues to do well and loves her home modifications and adaptive equipment Client View Of His/Her Situation:: [name] is happy with what the Aging Gracefully program has done for her--allowing her to feel safer and be more independent. Next Visit Plan:: This was our last session. It has been a pleasure working with [name].  Heather, OT Aging Gracefully [phone]

## 2023-09-29 NOTE — Patient Instructions (Signed)
 Name: Noelia Lenart   Date: 09/29/2023   OT ACTION PLAN: Functional Mobility   Target Problem Area:   Safety around in the house and in and out of the house    Why Problem May Occur:    Decreased balance  Pain  Target Goal(s):   Safety around the house and in/out of the house      STRATEGIES    Saving Your Energy DO:  Use the rollator to sit on when you get tired walking around or standing     Modifying your home environment and making it safe DO:  Threshold "ramps" in and out of house where surfaces are uneven    Simplifying the way you set up tasks or daily routines DO:  Plan for what you need to do the next day   PRACTICE  Based on what we have talked about, you are willing to try: the rollator when you need to be up on your feet for long periods of time.   Donny Ditch, OTR/L         09/29/2023  Occupational Therapist                        Date

## 2023-10-07 ENCOUNTER — Ambulatory Visit (AMBULATORY_SURGERY_CENTER)

## 2023-10-07 ENCOUNTER — Encounter: Payer: Self-pay | Admitting: Internal Medicine

## 2023-10-07 VITALS — Ht 66.0 in | Wt 213.0 lb

## 2023-10-07 DIAGNOSIS — Z860100 Personal history of colon polyps, unspecified: Secondary | ICD-10-CM

## 2023-10-07 MED ORDER — NA SULFATE-K SULFATE-MG SULF 17.5-3.13-1.6 GM/177ML PO SOLN: 1.0000 | Freq: Once | ORAL | 0 refills | Status: AC

## 2023-10-07 NOTE — Progress Notes (Signed)
 Pre visit completed via phone call; Patient verified name, DOB, and address; No soy allergy known to patient; patient does report issues with eating eggs by themselves, she reports she is able to eat cakes/pies with eggs cooked in them without any issues;   No issues known to pt with past sedation with any surgeries or procedures; Patient denies ever being told they had issues or difficulty with intubation;  No FH of Malignant Hyperthermia; Pt is not on diet pills; Pt is not on home 02;  Pt is not on blood thinners;  Pt denies issues with constipation in the last month;   No A fib or A flutter; Have any cardiac testing pending--NO Insurance verified during PV appt--- [facility] Medicare  Pt can ambulate without assistance;  Pt denies use of chewing tobacco; Discussed diabetic/weight loss medication holds; Discussed NSAID holds; Checked BMI to be less than 50; Pt instructed to use [employer] or GoodRx for a price reduction on prep;  Patient's chart reviewed by Norleen Schillings CNRA prior to previsit and patient appropriate for the [facility]; Pre visit completed and red dot placed by patient's name on their procedure day (on provider's schedule);   Instructions printed and placed on the second floor at the receptionist for patient to pick up;

## 2023-10-21 ENCOUNTER — Ambulatory Visit (AMBULATORY_SURGERY_CENTER): Admitting: Internal Medicine

## 2023-10-21 ENCOUNTER — Encounter: Payer: Self-pay | Admitting: Internal Medicine

## 2023-10-21 VITALS — BP 131/73 | HR 69 | Temp 98.2°F | Resp 15 | Ht 66.0 in | Wt 213.0 lb

## 2023-10-21 DIAGNOSIS — D125 Benign neoplasm of sigmoid colon: Secondary | ICD-10-CM

## 2023-10-21 DIAGNOSIS — Z860101 Personal history of adenomatous and serrated colon polyps: Secondary | ICD-10-CM

## 2023-10-21 DIAGNOSIS — K648 Other hemorrhoids: Secondary | ICD-10-CM

## 2023-10-21 DIAGNOSIS — Z1211 Encounter for screening for malignant neoplasm of colon: Secondary | ICD-10-CM | POA: Diagnosis not present

## 2023-10-21 DIAGNOSIS — Z860100 Personal history of colon polyps, unspecified: Secondary | ICD-10-CM

## 2023-10-21 MED ORDER — SODIUM CHLORIDE 0.9 % IV SOLN: 500.0000 mL | Freq: Once | INTRAVENOUS | Status: DC

## 2023-10-21 NOTE — Progress Notes (Signed)
 GASTROENTEROLOGY PROCEDURE H&P NOTE   Primary Care Physician: Campbell Reynolds, NP    Reason for Procedure:  History of adenomatous colon polyps  Plan:    Colonoscopy  Patient is appropriate for endoscopic procedure(s) in the ambulatory ([facility]) setting.  The nature of the procedure, as well as the risks, benefits, and alternatives were carefully and thoroughly reviewed with the patient. Ample time for discussion and questions allowed. The patient understood, was satisfied, and agreed to proceed.     HPI: Heather Gibson is a 73 y.o. female who presents for colonoscopy.  Medical history as below.  Tolerated the prep.  No recent chest pain or shortness of breath.  No abdominal pain today.  Past Medical History:  Diagnosis Date   Allergy    Arthritis    Constipation    GERD (gastroesophageal reflux disease)    Hyperlipidemia    Hypertension    Sleep apnea     Past Surgical History:  Procedure Laterality Date   ABDOMINAL HYSTERECTOMY     COLONOSCOPY  2017   COLONOSCOPY  04/2019   JMP-MAC-suprep(good)-TA   COLPORRHAPHY     PROLAPSED UTERINE FIBROID LIGATION     x2   SALPINGOOPHORECTOMY Right     Prior to Admission medications   Medication Sig Start Date End Date Taking? Authorizing Provider  allopurinol (ZYLOPRIM) 100 MG tablet Take 100 mg by mouth daily. 08/06/23  Yes [provider]  amLODipine  (NORVASC ) 10 MG tablet TAKE 1 TABLET EVERY DAY. APPOINTMENT IS NEEDED Patient taking differently: Take 10 mg by mouth daily. 05/05/22  Yes Nafziger, Darleene, NP  aspirin EC 81 MG tablet Take 81 mg by mouth daily. Swallow whole.   Yes [provider]  atorvastatin  (LIPITOR) 40 MG tablet TAKE 1 TABLET EVERY DAY 09/23/22  Yes Hilty, Vinie BROCKS, MD  Cholecalciferol (VITAMIN D3) 50 MCG (2000 UT) capsule Take 2,000 Units by mouth daily.   Yes [provider]  latanoprost (XALATAN) 0.005 % ophthalmic solution SMARTSIG:In Eye(s) 10/19/23  Yes [provider]  lisinopril -hydrochlorothiazide  (ZESTORETIC ) 20-25 MG tablet TAKE 1 TABLET EVERY DAY. APPOINTMENT IS NEEDED Patient taking differently: Take 1 tablet by mouth daily. 05/05/22  Yes Nafziger, Darleene, NP  omeprazole  (PRILOSEC) 20 MG capsule TAKE 1 CAPSULE EVERY DAY Patient taking differently: Take 20 mg by mouth daily. TAKE 1 CAPSULE EVERY DAY 05/05/22  Yes Nafziger, Darleene, NP  potassium chloride  SA (KLOR-CON  M) 20 MEQ tablet Take 20 mEq by mouth daily. 08/02/23  Yes [provider]  traZODone  (DESYREL ) 100 MG tablet TAKE 1 TABLET(100 MG) BY MOUTH AT BEDTIME Patient taking differently: Take 100 mg by mouth at bedtime. 04/17/22  Yes Nafziger, Cory, NP  acetaminophen (TYLENOL) 325 MG tablet Take 650 mg by mouth every 6 (six) hours as needed.    [provider]  diclofenac  sodium (VOLTAREN ) 1 % GEL Apply 2 g topically 4 (four) times daily as needed. 10/18/18   Nafziger, Darleene, NP  febuxostat (ULORIC) 40 MG tablet Take 40 mg by mouth daily.    [provider]  naproxen sodium (ALEVE) 220 MG tablet Take 220 mg by mouth daily as needed.    [provider]  nystatin powder Apply 1 Application topically 2 (two) times daily.    [provider]  REPATHA  SURECLICK 140 MG/ML SOAJ INJECT 140MG  UNDER THE SKIN EVERY 2 WEEKS Patient taking differently: Inject 140 mg as directed every 14 (fourteen) days. 05/04/22   Mona Vinie BROCKS, MD  topiramate (TOPAMAX) 50  MG tablet Take 50 mg by mouth daily. 08/02/23   [provider]    Current Outpatient Medications  Medication Sig Dispense Refill   allopurinol (ZYLOPRIM) 100 MG tablet Take 100 mg by mouth daily.     amLODipine  (NORVASC ) 10 MG tablet TAKE 1 TABLET EVERY DAY. APPOINTMENT IS NEEDED (Patient taking differently: Take 10 mg by mouth daily.) 90 tablet 3   aspirin EC 81 MG tablet Take 81 mg by mouth daily. Swallow whole.     atorvastatin  (LIPITOR) 40 MG tablet TAKE 1 TABLET EVERY DAY 90 tablet 3   Cholecalciferol  (VITAMIN D3) 50 MCG (2000 UT) capsule Take 2,000 Units by mouth daily.     latanoprost (XALATAN) 0.005 % ophthalmic solution SMARTSIG:In Eye(s)     lisinopril -hydrochlorothiazide  (ZESTORETIC ) 20-25 MG tablet TAKE 1 TABLET EVERY DAY. APPOINTMENT IS NEEDED (Patient taking differently: Take 1 tablet by mouth daily.) 90 tablet 3   omeprazole  (PRILOSEC) 20 MG capsule TAKE 1 CAPSULE EVERY DAY (Patient taking differently: Take 20 mg by mouth daily. TAKE 1 CAPSULE EVERY DAY) 90 capsule 3   potassium chloride  SA (KLOR-CON  M) 20 MEQ tablet Take 20 mEq by mouth daily.     traZODone  (DESYREL ) 100 MG tablet TAKE 1 TABLET(100 MG) BY MOUTH AT BEDTIME (Patient taking differently: Take 100 mg by mouth at bedtime.) 90 tablet 0   acetaminophen (TYLENOL) 325 MG tablet Take 650 mg by mouth every 6 (six) hours as needed.     diclofenac  sodium (VOLTAREN ) 1 % GEL Apply 2 g topically 4 (four) times daily as needed. 100 g 3   febuxostat (ULORIC) 40 MG tablet Take 40 mg by mouth daily.     naproxen sodium (ALEVE) 220 MG tablet Take 220 mg by mouth daily as needed.     nystatin powder Apply 1 Application topically 2 (two) times daily.     REPATHA  SURECLICK 140 MG/ML SOAJ INJECT 140MG  UNDER THE SKIN EVERY 2 WEEKS (Patient taking differently: Inject 140 mg as directed every 14 (fourteen) days.) 6 mL 3   topiramate (TOPAMAX) 50 MG tablet Take 50 mg by mouth daily.     Current Facility-Administered Medications  Medication Dose Route Frequency Provider Last Rate Last Admin   0.9 %  sodium chloride  infusion  500 mL Intravenous Once Britain Saber, Gordy HERO, MD        Allergies as of 10/21/2023 - Review Complete 10/21/2023  Allergen Reaction Noted   Crestor  [rosuvastatin ] Other (See Comments) 03/15/2019   Egg protein-containing drug products Other (See Comments)    Latex Swelling 10/10/2012   Milk-related compounds Other (See Comments)    Peanut-containing drug products Other (See Comments) 09/28/2006   Shellfish allergy Itching  03/13/2013   Tramadol  Itching 10/25/2019    Family History  Problem Relation Age of Onset   Breast cancer Mother        26s   Hypertension Mother    Schizophrenia Mother    Hypertension Maternal Grandmother    Diabetes Maternal Grandmother    Breast cancer Maternal Grandmother        ?   Colon cancer Neg Hx    Colon polyps Neg Hx    Esophageal cancer Neg Hx    Rectal cancer Neg Hx    Stomach cancer Neg Hx     Social History   Socioeconomic History   Marital status: Married    Spouse name: Not on file   Number of children: Not on file   Years of education: Not on  file   Highest education level: Not on file  Occupational History   Not on file  Tobacco Use   Smoking status: Never   Smokeless tobacco: Never  Vaping Use   Vaping status: Never Used  Substance and Sexual Activity   Alcohol use: No   Drug use: No   Sexual activity: Not Currently    Birth control/protection: Post-menopausal, Surgical  Other Topics Concern   Not on file  Social History Narrative   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   Married for 14 years   Three Children ( all live locally)    Social Drivers of Corporate investment banker Strain: Low Risk  (11/03/2021)   Overall Financial Resource Strain (CARDIA)    Difficulty of Paying Living Expenses: Not hard at all  Food Insecurity: No Food Insecurity (11/03/2021)   Hunger Vital Sign    Worried About Running Out of Food in the Last Year: Never true    Ran Out of Food in the Last Year: Never true  Transportation Needs: No Transportation Needs (11/03/2021)   PRAPARE - Administrator, Civil Service (Medical): No    Lack of Transportation (Non-Medical): No  Physical Activity: Inactive (11/03/2021)   Exercise Vital Sign    Days of Exercise per Week: 0 days    Minutes of Exercise per Session: 0 min  Stress: No Stress Concern Present (11/03/2021)   [employer] of Occupational Health - Occupational  Stress Questionnaire    Feeling of Stress : Not at all  Social Connections: Moderately Isolated (11/01/2020)   Social Connection and Isolation Panel    Frequency of Communication with Friends and Family: Twice a week    Frequency of Social Gatherings with Friends and Family: Twice a week    Attends Religious Services: More than 4 times per year    Active Member of [employer] or Organizations: No    Attends Banker Meetings: Never    Marital Status: Separated  Intimate Partner Violence: Not At Risk (11/01/2020)   Humiliation, Afraid, Rape, and Kick questionnaire    Fear of Current or Ex-Partner: No    Emotionally Abused: No    Physically Abused: No    Sexually Abused: No    Physical Exam: Vital signs in last 24 hours: @BP  (!) 148/76   Pulse 60   Temp 98.2 F (36.8 C) (Temporal)   Resp 12   Ht 5' 6 (1.676 m)   Wt 213 lb (96.6 kg)   SpO2 98%   BMI 34.38 kg/m  GEN: NAD EYE: Sclerae anicteric ENT: MMM CV: Non-tachycardic Pulm: CTA b/l GI: Soft, NT/ND NEURO:  Alert & Oriented x 3   Gordy Starch, MD [facility]  10/21/2023 11:17 AM

## 2023-10-21 NOTE — Patient Instructions (Signed)
 Handouts provided on polyps and hemorrhoids.  Resume previous diet. Continue present medications.  Await pathology results.  Repeat colonoscopy in 5 years for surveillance.   YOU HAD AN ENDOSCOPIC PROCEDURE TODAY AT THE [facility]:   Refer to the procedure report that was given to you for any specific questions about what was found during the examination.  If the procedure report does not answer your questions, please call your gastroenterologist to clarify.  If you requested that your care partner not be given the details of your procedure findings, then the procedure report has been included in a sealed envelope for you to review at your convenience later.  YOU SHOULD EXPECT: Some feelings of bloating in the abdomen. Passage of more gas than usual.  Walking can help get rid of the air that was put into your GI tract during the procedure and reduce the bloating. If you had a lower endoscopy (such as a colonoscopy or flexible sigmoidoscopy) you may notice spotting of blood in your stool or on the toilet paper. If you underwent a bowel prep for your procedure, you may not have a normal bowel movement for a few days.  Please Note:  You might notice some irritation and congestion in your nose or some drainage.  This is from the oxygen used during your procedure.  There is no need for concern and it should clear up in a day or so.  SYMPTOMS TO REPORT IMMEDIATELY:  Following lower endoscopy (colonoscopy or flexible sigmoidoscopy):  Excessive amounts of blood in the stool  Significant tenderness or worsening of abdominal pains  Swelling of the abdomen that is new, acute  Fever of 100F or higher  For urgent or emergent issues, a gastroenterologist can be reached at any hour by calling [phone]. Do not use MyChart messaging for urgent concerns.    DIET:  We do recommend a small meal at first, but then you may proceed to your regular diet.  Drink plenty of fluids but you should  avoid alcoholic beverages for 24 hours.  ACTIVITY:  You should plan to take it easy for the rest of today and you should NOT DRIVE or use heavy machinery until tomorrow (because of the sedation medicines used during the test).    FOLLOW UP: Our staff will call the number listed on your records the next business day following your procedure.  We will call around 7:15- 8:00 am to check on you and address any questions or concerns that you may have regarding the information given to you following your procedure. If we do not reach you, we will leave a message.     If any biopsies were taken you will be contacted by phone or by letter within the next 1-3 weeks.  Please call us  at [phone] if you have not heard about the biopsies in 3 weeks.    SIGNATURES/CONFIDENTIALITY: You and/or your care partner have signed paperwork which will be entered into your electronic medical record.  These signatures attest to the fact that that the information above on your After Visit Summary has been reviewed and is understood.  Full responsibility of the confidentiality of this discharge information lies with you and/or your care-partner.

## 2023-10-21 NOTE — Progress Notes (Signed)
 Vss nad trans to pacu

## 2023-10-21 NOTE — Progress Notes (Signed)
 Called to room to assist during endoscopic procedure.  Patient ID and intended procedure confirmed with present staff. Received instructions for my participation in the procedure from the performing physician.

## 2023-10-21 NOTE — Progress Notes (Signed)
 Pt's states no medical or surgical changes since previsit or office visit.

## 2023-10-21 NOTE — Op Note (Signed)
 [name] Patient Name: Heather Gibson Procedure Date: 10/21/2023 11:06 AM MRN: [id number] Endoscopist: Gordy CHRISTELLA Starch , MD, [id number] Age: 73 Referring MD:  Date of Birth: [birth year] Gender: Female Account #: [id number] Procedure:                Colonoscopy Indications:              High risk colon cancer surveillance: Personal                            history of multiple adenomas, Last colonoscopy:                            April 2021 (TA x 4), April 2017 (TA x 4) Medicines:                Monitored Anesthesia Care Procedure:                Pre-Anesthesia Assessment:                           - Prior to the procedure, a History and Physical                            was performed, and patient medications and                            allergies were reviewed. The patient's tolerance of                            previous anesthesia was also reviewed. The risks                            and benefits of the procedure and the sedation                            options and risks were discussed with the patient.                            All questions were answered, and informed consent                            was obtained. Prior Anticoagulants: The patient has                            taken no anticoagulant or antiplatelet agents. ASA                            Grade Assessment: III - A patient with severe                            systemic disease. After reviewing the risks and                            benefits, the patient was deemed in satisfactory  condition to undergo the procedure.                           After obtaining informed consent, the colonoscope                            was passed under direct vision. Throughout the                            procedure, the patient's blood pressure, pulse, and                            oxygen saturations were monitored continuously. The                            Olympus Scope SN  [id number] was introduced through the                            anus and advanced to the cecum, identified by                            appendiceal orifice and ileocecal valve. The                            colonoscopy was performed without difficulty. The                            patient tolerated the procedure well. The quality                            of the bowel preparation was good. The ileocecal                            valve, appendiceal orifice, and rectum were                            photographed. Scope In: 11:24:54 AM Scope Out: 11:37:16 AM Scope Withdrawal Time: 0 hours 8 minutes 50 seconds  Total Procedure Duration: 0 hours 12 minutes 22 seconds  Findings:                 The digital rectal exam was normal.                           A 4 mm polyp was found in the sigmoid colon. The                            polyp was sessile. The polyp was removed with a                            cold snare. Resection and retrieval were complete.                           Internal hemorrhoids were found during  retroflexion. The hemorrhoids were small.                           The exam was otherwise without abnormality. Complications:            No immediate complications. Estimated Blood Loss:     Estimated blood loss: none. Impression:               - One 4 mm polyp in the sigmoid colon, removed with                            a cold snare. Resected and retrieved.                           - Internal hemorrhoids.                           - The examination was otherwise normal. Recommendation:           - Patient has a contact number available for                            emergencies. The signs and symptoms of potential                            delayed complications were discussed with the                            patient. Return to normal activities tomorrow.                            Written discharge instructions were provided to the                             patient.                           - Resume previous diet.                           - Continue present medications.                           - Await pathology results.                           - Repeat colonoscopy in 5 years for surveillance. Gordy CHRISTELLA Starch, MD 10/21/2023 11:39:13 AM This report has been signed electronically.

## 2023-10-22 ENCOUNTER — Telehealth: Payer: Self-pay

## 2023-10-22 NOTE — Telephone Encounter (Signed)
  Follow up Call-     10/21/2023   10:29 AM  Call back number  Post procedure Call Back phone  # [phone]  Permission to leave phone message Yes     Patient questions:  Do you have a fever, pain , or abdominal swelling? No. Pain Score  0 *  Have you tolerated food without any problems? Yes.    Have you been able to return to your normal activities? Yes.    Do you have any questions about your discharge instructions: Diet   No. Medications  No. Follow up visit  No.  Do you have questions or concerns about your Care? No.  Actions: * If pain score is 4 or above: No action needed, pain <4.

## 2023-10-26 LAB — SURGICAL PATHOLOGY

## 2023-10-28 ENCOUNTER — Ambulatory Visit: Payer: Self-pay | Admitting: Internal Medicine

## 2023-11-03 ENCOUNTER — Other Ambulatory Visit: Payer: Self-pay

## 2023-11-03 NOTE — Patient Outreach (Signed)
 Aging Gracefully Program  11/03/2023  Heather Gibson 08-21-1950 [id number]   [facility] Evaluation Interviewer made contact with patient. Aging Gracefully 5 month survey completed.    Shereen [facility]unders [facility]  Population Health Care Management Assistant  Direct Dial: [phone]  Fax: [phone] Website: [email or web address]

## 2023-11-05 LAB — VITAMIN D 1,25 DIHYDROXY
Vitamin D 1, 25 (OH)2 Total: 49 pg/mL (ref 18–72)
Vitamin D2 1, 25 (OH)2: <8 pg/mL
Vitamin D3 1, 25 (OH)2: 49 pg/mL

## 2023-11-05 LAB — PTH, INTACT AND CALCIUM
Calcium: 9.9 mg/dL (ref 8.6–10.4)
PTH: 69 pg/mL (ref 16–77)

## 2023-11-05 LAB — RENAL FUNCTION PANEL
Albumin: 4.4 g/dL (ref 3.6–5.1)
BUN: 16 mg/dL (ref 7–25)
CO2: 29 mmol/L (ref 20–32)
Calcium: 9.9 mg/dL (ref 8.6–10.4)
Chloride: 105 mmol/L (ref 98–110)
Creat: 0.71 mg/dL (ref 0.60–1.00)
Glucose, Bld: 92 mg/dL (ref 65–139)
Phosphorus: 3.7 mg/dL (ref 2.1–4.3)
Potassium: 3.7 mmol/L (ref 3.5–5.3)
Sodium: 142 mmol/L (ref 135–146)

## 2023-11-05 LAB — TRAB (TSH RECEPTOR BINDING ANTIBODY): TRAB: <1 IU/L (ref <=2.00)

## 2023-11-05 LAB — T4, FREE: Free T4: 0.9 ng/dL (ref 0.8–1.8)

## 2023-11-05 LAB — T3, FREE: T3, Free: 3.4 pg/mL (ref 2.3–4.2)

## 2023-11-05 LAB — THYROID STIMULATING IMMUNOGLOBULIN: TSI: <89 %{baseline} (ref <140)

## 2023-11-05 LAB — VITAMIN D 25 HYDROXY (VIT D DEFICIENCY, FRACTURES): Vit D, 25-Hydroxy: 25 ng/mL — ABNORMAL LOW (ref 30–100)

## 2023-11-05 LAB — TSH: TSH: 0.28 m[IU]/L — ABNORMAL LOW (ref 0.40–4.50)

## 2023-11-05 LAB — MAGNESIUM: Magnesium: 2.2 mg/dL (ref 1.5–2.5)
